# Patient Record
Sex: Male | Born: 1965
Health system: Southern US, Community
[De-identification: ages and names within clinical notes are randomized; demographics above are authoritative.]

## PROBLEM LIST (undated history)

## (undated) DIAGNOSIS — N189 Chronic kidney disease, unspecified: Secondary | ICD-10-CM

## (undated) DIAGNOSIS — I10 Essential (primary) hypertension: Secondary | ICD-10-CM

## (undated) HISTORY — PX: ANKLE SURGERY: SHX546

## (undated) HISTORY — DX: Chronic kidney disease, unspecified: N18.9

---

## 1998-06-19 ENCOUNTER — Emergency Department (HOSPITAL_COMMUNITY): Admission: EM | Admit: 1998-06-19 | Discharge: 1998-06-19 | Payer: Self-pay | Admitting: Emergency Medicine

## 1998-07-18 ENCOUNTER — Emergency Department (HOSPITAL_COMMUNITY): Admission: EM | Admit: 1998-07-18 | Discharge: 1998-07-18 | Payer: Self-pay | Admitting: Family Medicine

## 1998-07-20 ENCOUNTER — Emergency Department (HOSPITAL_COMMUNITY): Admission: EM | Admit: 1998-07-20 | Discharge: 1998-07-21 | Payer: Self-pay | Admitting: Emergency Medicine

## 1998-07-31 ENCOUNTER — Emergency Department (HOSPITAL_COMMUNITY): Admission: EM | Admit: 1998-07-31 | Discharge: 1998-07-31 | Payer: Self-pay | Admitting: Emergency Medicine

## 1998-08-01 ENCOUNTER — Emergency Department (HOSPITAL_COMMUNITY): Admission: EM | Admit: 1998-08-01 | Discharge: 1998-08-02 | Payer: Self-pay | Admitting: Emergency Medicine

## 2000-01-11 ENCOUNTER — Emergency Department (HOSPITAL_COMMUNITY): Admission: EM | Admit: 2000-01-11 | Discharge: 2000-01-11 | Payer: Self-pay | Admitting: *Deleted

## 2002-04-22 ENCOUNTER — Emergency Department (HOSPITAL_COMMUNITY): Admission: EM | Admit: 2002-04-22 | Discharge: 2002-04-22 | Payer: Self-pay

## 2005-01-15 ENCOUNTER — Emergency Department (HOSPITAL_COMMUNITY): Admission: EM | Admit: 2005-01-15 | Discharge: 2005-01-15 | Payer: Self-pay | Admitting: Family Medicine

## 2005-12-09 ENCOUNTER — Emergency Department (HOSPITAL_COMMUNITY): Admission: EM | Admit: 2005-12-09 | Discharge: 2005-12-09 | Payer: Self-pay | Admitting: Emergency Medicine

## 2006-04-11 ENCOUNTER — Emergency Department (HOSPITAL_COMMUNITY): Admission: EM | Admit: 2006-04-11 | Discharge: 2006-04-12 | Payer: Self-pay | Admitting: Emergency Medicine

## 2006-05-02 ENCOUNTER — Ambulatory Visit: Payer: Self-pay | Admitting: Gastroenterology

## 2006-05-22 ENCOUNTER — Ambulatory Visit: Payer: Self-pay | Admitting: Gastroenterology

## 2006-07-07 ENCOUNTER — Ambulatory Visit: Payer: Self-pay | Admitting: Gastroenterology

## 2007-06-29 ENCOUNTER — Emergency Department (HOSPITAL_COMMUNITY): Admission: EM | Admit: 2007-06-29 | Discharge: 2007-06-30 | Payer: Self-pay | Admitting: Emergency Medicine

## 2007-07-02 ENCOUNTER — Emergency Department (HOSPITAL_COMMUNITY): Admission: EM | Admit: 2007-07-02 | Discharge: 2007-07-03 | Payer: Self-pay | Admitting: Emergency Medicine

## 2007-12-24 HISTORY — PX: ANKLE SURGERY: SHX546

## 2008-07-12 ENCOUNTER — Telehealth: Payer: Self-pay | Admitting: Gastroenterology

## 2008-07-13 ENCOUNTER — Ambulatory Visit: Payer: Self-pay | Admitting: Gastroenterology

## 2008-07-13 DIAGNOSIS — K602 Anal fissure, unspecified: Secondary | ICD-10-CM

## 2009-03-17 ENCOUNTER — Emergency Department (HOSPITAL_COMMUNITY): Admission: EM | Admit: 2009-03-17 | Discharge: 2009-03-17 | Payer: Self-pay | Admitting: Emergency Medicine

## 2009-03-29 ENCOUNTER — Encounter: Admission: RE | Admit: 2009-03-29 | Discharge: 2009-05-31 | Payer: Self-pay | Admitting: Family Medicine

## 2009-04-24 ENCOUNTER — Emergency Department (HOSPITAL_COMMUNITY): Admission: EM | Admit: 2009-04-24 | Discharge: 2009-04-24 | Payer: Self-pay | Admitting: Emergency Medicine

## 2009-05-18 ENCOUNTER — Ambulatory Visit (HOSPITAL_COMMUNITY): Admission: RE | Admit: 2009-05-18 | Discharge: 2009-05-19 | Payer: Self-pay | Admitting: Specialist

## 2009-05-25 ENCOUNTER — Emergency Department (HOSPITAL_COMMUNITY): Admission: EM | Admit: 2009-05-25 | Discharge: 2009-05-25 | Payer: Self-pay | Admitting: Emergency Medicine

## 2011-04-01 LAB — DIFFERENTIAL
Basophils Relative: 0 % (ref 0–1)
Eosinophils Absolute: 0.4 10*3/uL (ref 0.0–0.7)
Eosinophils Relative: 5 % (ref 0–5)
Monocytes Relative: 7 % (ref 3–12)
Neutrophils Relative %: 65 % (ref 43–77)

## 2011-04-01 LAB — BASIC METABOLIC PANEL
BUN: 11 mg/dL (ref 6–23)
CO2: 24 mEq/L (ref 19–32)
Chloride: 105 mEq/L (ref 96–112)
Creatinine, Ser: 0.95 mg/dL (ref 0.4–1.5)

## 2011-04-01 LAB — CBC
HCT: 40.6 % (ref 39.0–52.0)
MCHC: 34.3 g/dL (ref 30.0–36.0)
MCV: 93.4 fL (ref 78.0–100.0)
Platelets: 259 10*3/uL (ref 150–400)
RBC: 4.34 MIL/uL (ref 4.22–5.81)

## 2011-04-02 LAB — HEMOGLOBIN AND HEMATOCRIT, BLOOD: Hemoglobin: 13.4 g/dL (ref 13.0–17.0)

## 2011-04-04 LAB — POCT I-STAT, CHEM 8
BUN: 13 mg/dL (ref 6–23)
Creatinine, Ser: 1.1 mg/dL (ref 0.4–1.5)
Glucose, Bld: 208 mg/dL — ABNORMAL HIGH (ref 70–99)
Hemoglobin: 16 g/dL (ref 13.0–17.0)
Potassium: 3.5 mEq/L (ref 3.5–5.1)

## 2011-05-03 LAB — PULMONARY FUNCTION TEST

## 2011-05-07 NOTE — Op Note (Signed)
NAMECORNEILIUS, BIENSTOCK NO.:  [id number]   MEDICAL RECORD NO.:  [id number]          PATIENT TYPE:  OIB   LOCATION:  [id number]                         FACILITY:  [facility]   PHYSICIAN:  Susa Day, M.D.    DATE OF BIRTH:  [birth year]   DATE OF PROCEDURE:  05/18/2009  DATE OF DISCHARGE:                               OPERATIVE REPORT   PREOPERATIVE DIAGNOSIS:  Bimalleolar ankle fracture, right, partially  healed.   POSTOPERATIVE DIAGNOSIS:  Bimalleolar ankle fracture, right, partially  healed.   PROCEDURE PERFORMED:  1. Open reduction and internal fixation of bimalleolar ankle fracture.  2. Take down a partially healed union of fibula.  3. Stress radiographs under anesthesia.  4. Application of short leg cast.   Technical difficulty increased due to partial healing and requirement of  takedown of partial union prior to ORIF.   ANESTHESIA:  General.   ASSISTANT:  Rometta Emery, P.A.   BRIEF HISTORY:  This is a 45 year old with fracture of this fibula,  bimalleolar, as well as the tibia.  He was indicated for ORIF to  displaced fibular fracture and shortening.  He, however, had significant  asthma and required preop clearance 3 weeks prior to open reduction and  internal fixation.  Therefore, partially healed, technical difficulty  increased due to the partial feeling.  Risks and benefits discussed  including bleeding, infection, damage to neurovascular structures, DVT,  PE and anesthetic complications, etc.   TECHNIQUE:  With the patient in supine position after adequate level  general anesthesia and 2 grams Kefzol, the right lower extremity was  prepped and draped and exsanguinated in the usual sterile fashion.  Thigh tourniquet inflated to 300 mmHg.  Exam under anesthesia revealed  shortening of the fibula, minimal motion.  Some widening of the medial  clear space.  I made an incision over the fibula.  The subcutaneous  tissue was dissected.  Electrocautery  was used to achieve hemostasis.  We identified the fracture site.  Care was taken not to injure the  peroneal nerve or branches.  We incised over the fibula and found a  partial callus.  This was removed with a combination of curettes, dental  pick and distraction techniques.  Somewhat difficult, although we were  able to enter the fracture site, clear up the partial callus, curette it  to good bleeding tissue.  Then we were able to increase the length of  the fibula.  Distraction techniques were utilized.  After this was  performed, we utilized bone clamps and clamped the fibula at the  greatest length achieved.  I then placed an inner frag screw by  overdrilling 3.5 and then drilling 2.5 from anterior to posterior,  proximal to distal with the appropriate length cortical screw, with  excellent purchase and expression of the hematoma and under x-ray,  anatomic reduction of the fibula.  Syndesmosis appeared to be stable  here, and with the foot in neutral position, reduction of the posterior  malleolus.  Therefore, I contoured a third tubular plate, 7 hole, on the  fibula and secured it proximally with 3 fully-threaded cortical screws  distally with 2 cancellous screws and 1 cortical screw after appropriate  drilling depth gauge of measurement and insertion of screw with  excellent purchase.  Stress radiographs indicated no widening of the  syndesmosis, anatomic mortise and in a neutral position reduction of the  posterior malleolus as well.  The wound was copiously irrigated and then  repaired the subcu with 2-0 Vicryl simple sutures in the periosteum,  with care not to incorporate the nerve.  The skin was reapproximated  with staples and the wound was dressed sterilely.  The tourniquet was  deflated.  Adequate vascularization of the lower extremity appreciated.  A short leg cast in neutral position was applied, this using fiberglass  and cold water.  Following the curing of the cast in  neutral position,  he was extubated without difficulty and transported to the recovery room  in satisfactory condition.   The patient tolerated the procedure.  There were no complications.      Susa Day, M.D.  Electronically Signed     JB/MEDQ  D:  05/18/2009  T:  05/18/2009  Job:  [id number]

## 2011-05-10 NOTE — Assessment & Plan Note (Signed)
[facility] OFFICE NOTE   Walter Harmon                     MRN:          [id number]  DATE:07/07/2006                            DOB:          02/22/1966    PROBLEMS:  Anal fissure.   HISTORY OF PRESENT ILLNESS:  Walter Harmon has returned for scheduled GI  follow up.  Colonoscopy did not demonstrate any abnormalities.  On a regimen  of diltiazem, warm soaks and Anamantle, Walter Harmon's symptoms have  improved.  He still has occasional rectal pain with bleeding.  He moves his  bowels irregularly.  He is taking fiber supplementation.   PHYSICAL EXAMINATION:  VITAL SIGNS:  Pulse 86, blood pressure 118/84, weight  180.   IMPRESSION:  Sympathetic anal fissure.   RECOMMENDATIONS:  Continue current regimens.  If symptoms persist, I offered  to inject this fissure with a Botox.  He will contact me if this situation  occurs.                                   Sandy Salaam. Deatra Ina, MD, [facility]   RDK/MedQ  DD:  07/07/2006  DT:  07/07/2006  Job #:  [id number]   cc:   Lindwood Qua

## 2011-10-23 ENCOUNTER — Emergency Department (HOSPITAL_COMMUNITY)
Admission: EM | Admit: 2011-10-23 | Discharge: 2011-10-24 | Disposition: A | Discharge status: Home / Self Care | Payer: Self-pay | Attending: Emergency Medicine | Admitting: Emergency Medicine

## 2011-10-23 DIAGNOSIS — K219 Gastro-esophageal reflux disease without esophagitis: Secondary | ICD-10-CM | POA: Insufficient documentation

## 2011-10-23 DIAGNOSIS — J45909 Unspecified asthma, uncomplicated: Secondary | ICD-10-CM | POA: Insufficient documentation

## 2011-10-23 DIAGNOSIS — R0609 Other forms of dyspnea: Secondary | ICD-10-CM | POA: Insufficient documentation

## 2011-10-23 DIAGNOSIS — R0989 Other specified symptoms and signs involving the circulatory and respiratory systems: Secondary | ICD-10-CM | POA: Insufficient documentation

## 2011-10-28 ENCOUNTER — Emergency Department (HOSPITAL_COMMUNITY)
Admission: EM | Admit: 2011-10-28 | Discharge: 2011-10-28 | Disposition: A | Discharge status: Hospice | Payer: Self-pay | Source: Home / Self Care | Attending: Family Medicine | Admitting: Family Medicine

## 2011-10-28 DIAGNOSIS — J45901 Unspecified asthma with (acute) exacerbation: Secondary | ICD-10-CM

## 2011-10-28 MED ORDER — MONTELUKAST SODIUM 10 MG PO TABS: 10.0000 mg | ORAL_TABLET | Freq: Every day | ORAL | Status: DC

## 2011-10-28 MED ORDER — ALBUTEROL SULFATE (5 MG/ML) 0.5% IN NEBU
5.0000 mg | INHALATION_SOLUTION | Freq: Once | RESPIRATORY_TRACT | Status: AC
  Administered 2011-10-28: 5 mg via RESPIRATORY_TRACT

## 2011-10-28 MED ORDER — IPRATROPIUM BROMIDE 0.02 % IN SOLN
0.5000 mg | Freq: Once | RESPIRATORY_TRACT | Status: AC
  Administered 2011-10-28: 0.5 mg via RESPIRATORY_TRACT

## 2011-10-28 MED ORDER — ALBUTEROL SULFATE HFA 108 (90 BASE) MCG/ACT IN AERS: 1.0000 | INHALATION_SPRAY | Freq: Four times a day (QID) | RESPIRATORY_TRACT | Status: DC | PRN

## 2011-10-28 NOTE — ED Notes (Signed)
C/o asthma flare up since Thursday; audible wheeze; stated clear secretions

## 2011-10-28 NOTE — ED Provider Notes (Addendum)
History     CSN: [id number] Arrival date & time: 10/28/2011  9:03 PM   First MD Initiated Contact with Patient 10/28/11 2143      Chief Complaint  Patient presents with  . Asthma    (Consider location/radiation/quality/duration/timing/severity/associated sxs/prior treatment) Patient is a 45 y.o. male presenting with asthma. The history is provided by the patient.  Asthma This is a recurrent problem. The current episode started 6 to 12 hours ago (pt feels sx aggravated by recent weather change). The problem has been gradually worsening. Associated symptoms include shortness of breath. Improvement on treatment: ran out of pro -air last week.    Past Medical History  Diagnosis Date  . Asthma   . Diabetes mellitus     History reviewed. No pertinent past surgical history.  History reviewed. No pertinent family history.  History  Substance Use Topics  . Smoking status: Never Smoker   . Smokeless tobacco: Not on file  . Alcohol Use: No      Review of Systems  Constitutional: Negative for fever and diaphoresis.  HENT: Negative.   Respiratory: Positive for cough, shortness of breath and wheezing.   Cardiovascular: Negative.     Allergies  Review of patient's allergies indicates no known allergies.  Home Medications   Current Outpatient Rx  Name Route Sig Dispense Refill  . BUDESONIDE-FORMOTEROL FUMARATE 160-4.5 MCG/ACT IN AERO Inhalation Inhale 2 puffs into the lungs 2 (two) times daily.      Marland Kitchen GLIPIZIDE 10 MG PO TABS Oral Take 10 mg by mouth 2 (two) times daily before a meal.      . METFORMIN HCL 1000 MG PO TABS Oral Take 1,000 mg by mouth 2 (two) times daily with a meal.      . ALBUTEROL SULFATE HFA 108 (90 BASE) MCG/ACT IN AERS Inhalation Inhale 1-2 puffs into the lungs every 6 (six) hours as needed for wheezing. 1 Inhaler 0  . MONTELUKAST SODIUM 10 MG PO TABS Oral Take 1 tablet (10 mg total) by mouth at bedtime. 30 tablet 1    BP 138/96  Pulse 110  Temp(Src)  97.1 F (36.2 C) (Oral)  Resp 20  SpO2 91%  Physical Exam  Constitutional: He is oriented to person, place, and time. He appears well-developed and well-nourished.  HENT:  Head: Normocephalic.  Mouth/Throat: Oropharynx is clear and moist.  Eyes: Pupils are equal, round, and reactive to light.  Neck: Normal range of motion. Neck supple.  Cardiovascular: Normal rate, regular rhythm and normal heart sounds.   Pulmonary/Chest: He has wheezes. He has no rales.  Abdominal: Soft. There is no tenderness.  Lymphadenopathy:    He has no cervical adenopathy.  Neurological: He is alert and oriented to person, place, and time.  Skin: Skin is warm and dry.    ED Course  Procedures (including critical care time)  Labs Reviewed - No data to display No results found.   1. Asthma exacerbation       MDM          Pauline Good, MD 10/28/11 2155  Billy Fischer, MD 04/17/12 [phone]

## 2011-11-06 LAB — PULMONARY FUNCTION TEST

## 2012-08-09 ENCOUNTER — Inpatient Hospital Stay (HOSPITAL_COMMUNITY)
Admission: EM | Admit: 2012-08-09 | Discharge: 2012-08-12 | DRG: 203 | Disposition: A | Discharge status: Home / Self Care | Payer: Medicaid Other | Attending: Internal Medicine | Admitting: Internal Medicine

## 2012-08-09 ENCOUNTER — Encounter (HOSPITAL_COMMUNITY): Payer: Self-pay | Admitting: *Deleted

## 2012-08-09 DIAGNOSIS — J45901 Unspecified asthma with (acute) exacerbation: Principal | ICD-10-CM | POA: Diagnosis present

## 2012-08-09 DIAGNOSIS — T380X5A Adverse effect of glucocorticoids and synthetic analogues, initial encounter: Secondary | ICD-10-CM | POA: Diagnosis present

## 2012-08-09 DIAGNOSIS — J45909 Unspecified asthma, uncomplicated: Secondary | ICD-10-CM | POA: Diagnosis present

## 2012-08-09 DIAGNOSIS — R000 Tachycardia, unspecified: Secondary | ICD-10-CM | POA: Diagnosis present

## 2012-08-09 DIAGNOSIS — E119 Type 2 diabetes mellitus without complications: Secondary | ICD-10-CM | POA: Diagnosis present

## 2012-08-09 DIAGNOSIS — Z79899 Other long term (current) drug therapy: Secondary | ICD-10-CM

## 2012-08-09 DIAGNOSIS — E1129 Type 2 diabetes mellitus with other diabetic kidney complication: Secondary | ICD-10-CM | POA: Diagnosis present

## 2012-08-09 DIAGNOSIS — N183 Chronic kidney disease, stage 3 unspecified: Secondary | ICD-10-CM | POA: Diagnosis present

## 2012-08-09 MED ORDER — ALBUTEROL SULFATE (5 MG/ML) 0.5% IN NEBU
5.0000 mg | INHALATION_SOLUTION | Freq: Once | RESPIRATORY_TRACT | Status: AC
  Administered 2012-08-09: 5 mg via RESPIRATORY_TRACT
  Filled 2012-08-09: qty 1

## 2012-08-09 MED ORDER — ALBUTEROL SULFATE (5 MG/ML) 0.5% IN NEBU
INHALATION_SOLUTION | RESPIRATORY_TRACT | Status: AC
  Filled 2012-08-09: qty 2

## 2012-08-09 MED ORDER — ALBUTEROL (5 MG/ML) CONTINUOUS INHALATION SOLN
10.0000 mg/h | INHALATION_SOLUTION | RESPIRATORY_TRACT | Status: AC
  Administered 2012-08-09: 10 mg/h via RESPIRATORY_TRACT

## 2012-08-09 MED ORDER — IPRATROPIUM BROMIDE 0.02 % IN SOLN
0.5000 mg | Freq: Once | RESPIRATORY_TRACT | Status: AC
  Administered 2012-08-09: 0.5 mg via RESPIRATORY_TRACT
  Filled 2012-08-09: qty 2.5

## 2012-08-09 NOTE — ED Notes (Signed)
RT contacted to perform treatment

## 2012-08-09 NOTE — ED Notes (Signed)
breathing treatment in progress. Martie Round RN present to start IV

## 2012-08-09 NOTE — ED Notes (Signed)
Pt states increasing problems with asthma/shortness of breath since Friday; no better after using enhaler

## 2012-08-09 NOTE — ED Provider Notes (Signed)
History     CSN: [id number]  Arrival date & time 08/09/12  2045   None     Chief Complaint  Patient presents with  . Shortness of Breath    (Consider location/radiation/quality/duration/timing/severity/associated sxs/prior treatment) HPI Comments: Patient with a history of asthma presents with shortness of breath and increase wheezing for the past 2 days. He reports the change in weather seems to be affecting his breathing. He does not get relief with his albuterol inhaler. Nothing seems to relieve his SOB according to the patient. He denies chest pain, fever, cough, NVD, recent illness.   Patient is a 46 y.o. male presenting with shortness of breath.  Shortness of Breath  Associated symptoms include shortness of breath. Pertinent negatives include no chest pain and no fever.    Past Medical History  Diagnosis Date  . Asthma   . Diabetes mellitus     History reviewed. No pertinent past surgical history.  No family history on file.  History  Substance Use Topics  . Smoking status: Never Smoker   . Smokeless tobacco: Not on file  . Alcohol Use: No      Review of Systems  Constitutional: Negative for fever, chills, diaphoresis and fatigue.  HENT: Negative for sneezing and sinus pressure.   Eyes: Negative for visual disturbance.  Respiratory: Positive for shortness of breath.   Cardiovascular: Negative for chest pain.  Gastrointestinal: Negative for abdominal pain.  Musculoskeletal: Negative for back pain.  Skin: Negative for rash and wound.  Neurological: Negative for weakness and headaches.    Allergies  Review of patient's allergies indicates no known allergies.  Home Medications   Current Outpatient Rx  Name Route Sig Dispense Refill  . ALBUTEROL SULFATE HFA 108 (90 BASE) MCG/ACT IN AERS Inhalation Inhale 1-2 puffs into the lungs every 6 (six) hours as needed.    . BUDESONIDE-FORMOTEROL FUMARATE 160-4.5 MCG/ACT IN AERO Inhalation Inhale 2 puffs into the  lungs 2 (two) times daily.      Marland Kitchen GLIPIZIDE 10 MG PO TABS Oral Take 10 mg by mouth 2 (two) times daily before a meal.      . METFORMIN HCL 1000 MG PO TABS Oral Take 1,000 mg by mouth 2 (two) times daily with a meal.      . MONTELUKAST SODIUM 10 MG PO TABS Oral Take 10 mg by mouth at bedtime.      BP 146/91  Pulse 121  Temp 98.6 F (37 C) (Oral)  Resp 24  SpO2 98%  Physical Exam  Nursing note and vitals reviewed. Constitutional: He is oriented to person, place, and time. He appears well-developed and well-nourished. No distress.  HENT:  Head: Normocephalic and atraumatic.  Mouth/Throat: No oropharyngeal exudate.  Eyes: Conjunctivae are normal. No scleral icterus.  Neck: Normal range of motion.  Cardiovascular: Regular rhythm.  Exam reveals no gallop and no friction rub.   No murmur heard.      Tachycardic in 120's  Pulmonary/Chest: Effort normal. He has wheezes.       Notable wheezing throughout lung fields bilaterally.   Abdominal: Soft. There is no tenderness.  Musculoskeletal: Normal range of motion.  Neurological: He is alert and oriented to person, place, and time.  Skin: Skin is warm and dry. He is not diaphoretic.  Psychiatric: He has a normal mood and affect. His behavior is normal.    ED Course  Procedures (including critical care time)  Labs Reviewed - No data to display No results found.  No diagnosis found.    MDM  11:04 PM Patient has notable wheezing throughout lung fields. A 1 hour albuterol nebulizer treatment is appropriate at this time.   12:25 AM Patient became diaphoretic and tachycardic upon receiving albuterol nebulizer. Patient signed out to Dr. Sharol Given.       Alvina Chou, PA-C 08/10/12 0019  Alvina Chou, PA-C 08/10/12 [phone]

## 2012-08-10 ENCOUNTER — Inpatient Hospital Stay (HOSPITAL_COMMUNITY): Payer: Medicaid Other

## 2012-08-10 ENCOUNTER — Other Ambulatory Visit (HOSPITAL_COMMUNITY): Payer: Self-pay

## 2012-08-10 ENCOUNTER — Encounter (HOSPITAL_COMMUNITY): Payer: Self-pay | Admitting: Internal Medicine

## 2012-08-10 DIAGNOSIS — J45901 Unspecified asthma with (acute) exacerbation: Principal | ICD-10-CM

## 2012-08-10 DIAGNOSIS — E119 Type 2 diabetes mellitus without complications: Secondary | ICD-10-CM | POA: Diagnosis present

## 2012-08-10 DIAGNOSIS — J45909 Unspecified asthma, uncomplicated: Secondary | ICD-10-CM | POA: Diagnosis present

## 2012-08-10 DIAGNOSIS — R000 Tachycardia, unspecified: Secondary | ICD-10-CM

## 2012-08-10 DIAGNOSIS — J441 Chronic obstructive pulmonary disease with (acute) exacerbation: Secondary | ICD-10-CM

## 2012-08-10 LAB — CARDIAC PANEL(CRET KIN+CKTOT+MB+TROPI)
CK, MB: 3.6 ng/mL (ref 0.3–4.0)
CK, MB: 3.6 ng/mL (ref 0.3–4.0)
CK, MB: 4 ng/mL (ref 0.3–4.0)
Relative Index: 1.1 (ref 0.0–2.5)
Total CK: 399 U/L — ABNORMAL HIGH (ref 7–232)
Troponin I: <0.3 ng/mL (ref <0.30)
Troponin I: <0.3 ng/mL (ref <0.30)

## 2012-08-10 LAB — GLUCOSE, CAPILLARY: Glucose-Capillary: 156 mg/dL — ABNORMAL HIGH (ref 70–99)

## 2012-08-10 LAB — HEMOGLOBIN A1C
Hgb A1c MFr Bld: 7.1 % — ABNORMAL HIGH (ref <5.7)
Hgb A1c MFr Bld: 7.2 % — ABNORMAL HIGH (ref <5.7)
Mean Plasma Glucose: 157 mg/dL — ABNORMAL HIGH (ref <117)

## 2012-08-10 LAB — COMPREHENSIVE METABOLIC PANEL
Alkaline Phosphatase: 59 U/L (ref 39–117)
BUN: 14 mg/dL (ref 6–23)
Chloride: 102 mEq/L (ref 96–112)
GFR calc Af Amer: 49 mL/min — ABNORMAL LOW (ref >90)
Glucose, Bld: 220 mg/dL — ABNORMAL HIGH (ref 70–99)
Potassium: 4 mEq/L (ref 3.5–5.1)
Total Bilirubin: 0.2 mg/dL — ABNORMAL LOW (ref 0.3–1.2)

## 2012-08-10 LAB — CBC
MCH: 31 pg (ref 26.0–34.0)
MCV: 91.3 fL (ref 78.0–100.0)
Platelets: 236 10*3/uL (ref 150–400)
RBC: 4.26 MIL/uL (ref 4.22–5.81)
RDW: 13.5 % (ref 11.5–15.5)

## 2012-08-10 MED ORDER — SODIUM CHLORIDE 0.9 % IJ SOLN
3.0000 mL | Freq: Two times a day (BID) | INTRAMUSCULAR | Status: DC
  Administered 2012-08-10 – 2012-08-11 (×2): 3 mL via INTRAVENOUS

## 2012-08-10 MED ORDER — PREDNISONE 20 MG PO TABS: 60.0000 mg | ORAL_TABLET | Freq: Once | ORAL | Status: DC

## 2012-08-10 MED ORDER — SODIUM CHLORIDE 0.9 % IV SOLN
INTRAVENOUS | Status: DC
  Administered 2012-08-10 – 2012-08-12 (×5): via INTRAVENOUS

## 2012-08-10 MED ORDER — BUDESONIDE-FORMOTEROL FUMARATE 160-4.5 MCG/ACT IN AERO
2.0000 | INHALATION_SPRAY | Freq: Two times a day (BID) | RESPIRATORY_TRACT | Status: DC
  Administered 2012-08-10 – 2012-08-12 (×5): 2 via RESPIRATORY_TRACT
  Filled 2012-08-10: qty 6

## 2012-08-10 MED ORDER — METHYLPREDNISOLONE SODIUM SUCC 125 MG IJ SOLR
80.0000 mg | Freq: Three times a day (TID) | INTRAMUSCULAR | Status: DC
  Administered 2012-08-10 – 2012-08-11 (×4): 80 mg via INTRAVENOUS
  Filled 2012-08-10 (×7): qty 1.28

## 2012-08-10 MED ORDER — METHYLPREDNISOLONE SODIUM SUCC 125 MG IJ SOLR
INTRAMUSCULAR | Status: AC
  Administered 2012-08-10: 125 mg via INTRAVENOUS
  Filled 2012-08-10: qty 2

## 2012-08-10 MED ORDER — INSULIN ASPART 100 UNIT/ML ~~LOC~~ SOLN
0.0000 [IU] | Freq: Every day | SUBCUTANEOUS | Status: DC
  Administered 2012-08-11: 2 [IU] via SUBCUTANEOUS

## 2012-08-10 MED ORDER — ALBUTEROL (5 MG/ML) CONTINUOUS INHALATION SOLN
10.0000 mg/h | INHALATION_SOLUTION | RESPIRATORY_TRACT | Status: AC
  Administered 2012-08-10: 10 mg/h via RESPIRATORY_TRACT

## 2012-08-10 MED ORDER — METHYLPREDNISOLONE SODIUM SUCC 125 MG IJ SOLR
125.0000 mg | Freq: Once | INTRAMUSCULAR | Status: AC
  Administered 2012-08-10: 125 mg via INTRAVENOUS

## 2012-08-10 MED ORDER — INSULIN ASPART 100 UNIT/ML ~~LOC~~ SOLN
0.0000 [IU] | Freq: Three times a day (TID) | SUBCUTANEOUS | Status: DC
  Administered 2012-08-10: 2 [IU] via SUBCUTANEOUS
  Administered 2012-08-10: 5 [IU] via SUBCUTANEOUS
  Administered 2012-08-10 – 2012-08-11 (×2): 2 [IU] via SUBCUTANEOUS
  Administered 2012-08-11 (×2): 3 [IU] via SUBCUTANEOUS
  Administered 2012-08-12 (×2): 2 [IU] via SUBCUTANEOUS
  Administered 2012-08-12: 3 [IU] via SUBCUTANEOUS

## 2012-08-10 MED ORDER — ALBUTEROL SULFATE HFA 108 (90 BASE) MCG/ACT IN AERS
1.0000 | INHALATION_SPRAY | Freq: Four times a day (QID) | RESPIRATORY_TRACT | Status: DC | PRN
  Filled 2012-08-10: qty 6.7

## 2012-08-10 MED ORDER — METFORMIN HCL 500 MG PO TABS
1000.0000 mg | ORAL_TABLET | Freq: Two times a day (BID) | ORAL | Status: DC
  Administered 2012-08-10: 1000 mg via ORAL
  Filled 2012-08-10 (×3): qty 2

## 2012-08-10 MED ORDER — TECHNETIUM TO 99M ALBUMIN AGGREGATED
3.1000 | Freq: Once | INTRAVENOUS | Status: AC | PRN
  Administered 2012-08-10: 3 via INTRAVENOUS

## 2012-08-10 MED ORDER — GI COCKTAIL ~~LOC~~
30.0000 mL | Freq: Three times a day (TID) | ORAL | Status: AC | PRN
  Administered 2012-08-10 – 2012-08-12 (×3): 30 mL via ORAL
  Filled 2012-08-10 (×3): qty 30

## 2012-08-10 MED ORDER — ALBUTEROL SULFATE (5 MG/ML) 0.5% IN NEBU
2.5000 mg | INHALATION_SOLUTION | Freq: Four times a day (QID) | RESPIRATORY_TRACT | Status: DC
  Administered 2012-08-10 – 2012-08-11 (×7): 2.5 mg via RESPIRATORY_TRACT
  Filled 2012-08-10 (×7): qty 0.5

## 2012-08-10 MED ORDER — CHLORPROMAZINE HCL 25 MG PO TABS
25.0000 mg | ORAL_TABLET | Freq: Once | ORAL | Status: AC
  Administered 2012-08-10: 25 mg via ORAL
  Filled 2012-08-10: qty 1

## 2012-08-10 MED ORDER — ALUM & MAG HYDROXIDE-SIMETH 200-200-20 MG/5ML PO SUSP
30.0000 mL | ORAL | Status: DC | PRN
  Administered 2012-08-10 – 2012-08-12 (×5): 30 mL via ORAL
  Filled 2012-08-10 (×6): qty 30

## 2012-08-10 MED ORDER — MONTELUKAST SODIUM 10 MG PO TABS
10.0000 mg | ORAL_TABLET | Freq: Every day | ORAL | Status: DC
  Administered 2012-08-10 – 2012-08-11 (×2): 10 mg via ORAL
  Filled 2012-08-10 (×3): qty 1

## 2012-08-10 MED ORDER — GLIPIZIDE 10 MG PO TABS
10.0000 mg | ORAL_TABLET | Freq: Two times a day (BID) | ORAL | Status: DC
  Administered 2012-08-10 – 2012-08-12 (×6): 10 mg via ORAL
  Filled 2012-08-10 (×7): qty 1

## 2012-08-10 NOTE — ED Notes (Signed)
Report called to 4E.

## 2012-08-10 NOTE — ED Notes (Signed)
Pt spilled last part of neb tx.

## 2012-08-10 NOTE — Progress Notes (Signed)
PHARMACIST - PHYSICIAN COMMUNICATION DR:  Allyson Sabal CONCERNING:  METFORMIN SAFE ADMINISTRATION POLICY  RECOMMENDATION: Metformin has been placed on DISCONTINUE (rejected order) STATUS and should be reordered only after any of the conditions below are ruled out.  Current safety recommendations include avoiding metformin for a minimum of 48 hours after the patient's exposure to intravenous contrast media.  DESCRIPTION:  The Pharmacy Committee has adopted a policy that restricts the use of metformin in hospitalized patients until all the contraindications to administration have been ruled out. Specific contraindications are: [x]  Serum creatinine ? 1.5 for males []  Serum creatinine ? 1.4 for females []  Shock, acute MI, sepsis, hypoxemia, dehydration []  Planned administration of intravenous iodinated contrast media []  Heart Failure patients with low EF []  Acute or chronic metabolic acidosis (including DKA)      Hershal Coria, PharmD, BCPS Pager: [phone] 08/10/2012 2:59 PM

## 2012-08-10 NOTE — Progress Notes (Signed)
Patient seen and examined agree with the plan of care outlined by Dr. Maudie Mercury Continue high-dose IV Solu-Medrol Nebulizer treatments 2-D echo will be done Check a d-dimer given sinus tachycardia hypoxia if elevated CT angiogram chest to rule out pulmonary embolism

## 2012-08-10 NOTE — H&P (Signed)
[facility] History and Physical  Walter Harmon [id number] DOB: 08/12/1966 DOA: 08/09/2012  Referring physician: Sharol Given PCP: No primary provider on file.  Walter Harmon  Chief Complaint: sob  HPI:  46 yo male with asthma c/o sob starting on Friday, + cough with phlegm, white, chest sore with coughing.  denies palp, n/v, diarrhea, brbpr, fever, chills.  Pt came in because sob, not improving with albuterol and symbicort.    In ED tx with multiple alb neb wihtout signficant improvement ,  CXR pending,  Pt will be admitted for asthma exacerbation.   Review of Systems:  Negative for all organ systems except for + above.   Past Medical History  Diagnosis Date  . Asthma   . Diabetes mellitus    History reviewed. No pertinent past surgical history. Social History:  reports that he has never smoked. He has never used smokeless tobacco. He reports that he does not drink alcohol. His drug history not on file. Lives with parents,  Able to perform all ADLS  No Known Allergies  Family History  Problem Relation Age of Onset  . Diabetes type II Mother   (be sure to complete)  Prior to Admission medications   Medication Sig Start Date End Date Taking? Authorizing Provider  albuterol (PROVENTIL HFA;VENTOLIN HFA) 108 (90 BASE) MCG/ACT inhaler Inhale 1-2 puffs into the lungs every 6 (six) hours as needed. 10/28/11 10/27/12 Yes Billy Fischer, MD  budesonide-formoterol [facility]) 160-4.5 MCG/ACT inhaler Inhale 2 puffs into the lungs 2 (two) times daily.     Yes Historical Provider, MD  glipiZIDE (GLUCOTROL) 10 MG tablet Take 10 mg by mouth 2 (two) times daily before a meal.     Yes Historical Provider, MD  metFORMIN (GLUCOPHAGE) 1000 MG tablet Take 1,000 mg by mouth 2 (two) times daily with a meal.     Yes Historical Provider, MD  montelukast (SINGULAIR) 10 MG tablet Take 10 mg by mouth at bedtime. 10/28/11 10/27/12 Yes Billy Fischer, MD   Physical Exam: Filed Vitals:   08/10/12 0130  08/10/12 0145 08/10/12 0158 08/10/12 0200  BP: 130/85 146/92 141/88 128/78  Pulse: 113 113  125  Temp:      TempSrc:      Resp: 21 21 18 16   SpO2: 99% 99% 100% 100%     General:  Young african [place] male  Eyes: anicteric  ENT: mmm  Neck: no jvd, no bruit, no tm, no adenopaty  Cardiovascular: rrr s1, s2  Respiratory: + wheeze, no crackles  Abdomen: soft, nt, nd, +bs  Skin: no rash, no c/c/e  Musculoskeletal: wnl  Psychiatric: no depression, axox3  Neurologic: nonfocal  Labs on Admission:  Basic Metabolic Panel: No results found for this basename: NA:5,K:5,CL:5,CO2:5,GLUCOSE:5,BUN:5,CREATININE:5,CALCIUM:5,MG:5,PHOS:5 in the last 168 hours Liver Function Tests: No results found for this basename: AST:5,ALT:5,ALKPHOS:5,BILITOT:5,PROT:5,ALBUMIN:5 in the last 168 hours No results found for this basename: LIPASE:5,AMYLASE:5 in the last 168 hours No results found for this basename: AMMONIA:5 in the last 168 hours CBC: No results found for this basename: WBC:5,NEUTROABS:5,HGB:5,HCT:5,MCV:5,PLT:5 in the last 168 hours Cardiac Enzymes: No results found for this basename: CKTOTAL:5,CKMB:5,CKMBINDEX:5,TROPONINI:5 in the last 168 hours  BNP (last 3 results) No results found for this basename: PROBNP:3 in the last 8760 hours CBG: No results found for this basename: GLUCAP:5 in the last 168 hours  Radiological Exams on Admission: No results found.  EKG:   Assessment/Plan Active Problems:  Asthma exacerbation  Diabetes type 2, controlled   1. Asthma exacerbation:  Solumedrol , albuterol, budesonide, albuterol prn,, CXR 2. Dm2: fsbs ac and qhs, ISS,  Glipizide, metformin.  3. Tachycardia likely secondary to albuterol. Cycle cardiac markers,  Consider further w/up with TSH, Echo   Code Status: Full Family Communication: father, Walter Harmon [id number] Disposition Plan: home  Time spent: 81minutes  Jani Gravel [facility] [phone] If 7PM-7AM,  please contact night-coverage [email or web address] Password [email or web address] 08/10/2012, 2:58 AM

## 2012-08-10 NOTE — Care Management Note (Signed)
    Page 1 of 1   08/13/2012     1:33:01 PM   CARE MANAGEMENT NOTE 08/13/2012  Patient:  Walter Harmon, Walter Harmon   Account Number:  [id number]  Date Initiated:  08/10/2012  Documentation initiated by:  [facility]  Subjective/Objective Assessment:   ADMITTED [id number]     Action/Plan:   FROM HOME   Anticipated DC Date:  08/12/2012   Anticipated DC Plan:  [place]  CM consult      Choice offered to / List presented to:             Status of service:  Completed, signed off Medicare Important Message given?   (If response is "NO", the following Medicare IM given date fields will be blank) Date Medicare IM given:   Date Additional Medicare IM given:    Discharge Disposition:  HOME/SELF CARE  Per UR Regulation:  Reviewed for med. necessity/level of care/duration of stay  If discussed at [place] of Stay Meetings, dates discussed:    Comments:  08/10/12 [facility] RN,BSN NCM [id number]

## 2012-08-11 DIAGNOSIS — J45909 Unspecified asthma, uncomplicated: Secondary | ICD-10-CM

## 2012-08-11 DIAGNOSIS — R0602 Shortness of breath: Secondary | ICD-10-CM

## 2012-08-11 LAB — GLUCOSE, CAPILLARY
Glucose-Capillary: 201 mg/dL — ABNORMAL HIGH (ref 70–99)
Glucose-Capillary: 240 mg/dL — ABNORMAL HIGH (ref 70–99)

## 2012-08-11 LAB — COMPREHENSIVE METABOLIC PANEL
ALT: 16 U/L (ref 0–53)
Calcium: 8.6 mg/dL (ref 8.4–10.5)
GFR calc Af Amer: 50 mL/min — ABNORMAL LOW (ref >90)
Glucose, Bld: 163 mg/dL — ABNORMAL HIGH (ref 70–99)
Sodium: 136 mEq/L (ref 135–145)
Total Protein: 7.3 g/dL (ref 6.0–8.3)

## 2012-08-11 LAB — DIFFERENTIAL
Lymphocytes Relative: 7 % — ABNORMAL LOW (ref 12–46)
Monocytes Absolute: 0.6 10*3/uL (ref 0.1–1.0)
Monocytes Relative: 3 % (ref 3–12)
Neutro Abs: 19.8 10*3/uL — ABNORMAL HIGH (ref 1.7–7.7)

## 2012-08-11 LAB — CBC
HCT: 36.7 % — ABNORMAL LOW (ref 39.0–52.0)
Hemoglobin: 12.8 g/dL — ABNORMAL LOW (ref 13.0–17.0)
MCHC: 34.9 g/dL (ref 30.0–36.0)
WBC: 21.9 10*3/uL — ABNORMAL HIGH (ref 4.0–10.5)

## 2012-08-11 MED ORDER — METHYLPREDNISOLONE SODIUM SUCC 40 MG IJ SOLR
40.0000 mg | Freq: Three times a day (TID) | INTRAMUSCULAR | Status: DC
  Administered 2012-08-11 – 2012-08-12 (×4): 40 mg via INTRAVENOUS
  Filled 2012-08-11 (×6): qty 1

## 2012-08-11 NOTE — Progress Notes (Signed)
[facility] PROGRESS NOTE  Walter Harmon [id number] DOB: 04-03-1966 DOA: 08/09/2012 PCP: No primary provider on file.  Assessment/Plan: Active Problems:  Asthma exacerbation  Diabetes type 2, controlled  1. Asthma exacerbation: Solumedrol , albuterol, budesonide, albuterol prn,, CXR improving slowly, taper steroids today 2. Dm2: fsbs ac and qhs, ISS, Glipizide, metformin. Hemoglobin A1c of 7.1 3. Tachycardia likely secondary to albuterol. Cycle cardiac markers, TSH normal, 2-D echo pending, VQ scan negative for PE, elevated d-dimer Doppler pending 4. Leukocytosis secondary to steroids no focal symptoms 5. CK D. stage III, this is likely chronic, no labs to compare, normal kidney function 3 years ago, patient was told last year during an insurance health checkup and his creatinine was elevated, will need an outpatient nephrology referral  Code Status: full Family Communication: family updated about patient's clinical progress Disposition Plan:  As above    Brief narrative: 46 yo male with asthma c/o sob starting on Friday, + cough with phlegm, white, chest sore with coughing. denies palp, n/v, diarrhea, brbpr, fever, chills. Pt came in because sob, not improving with albuterol and symbicort   Consultants:  None  Procedures:  2-D echo, VQ scan  Antibiotics:  1  HPI/Subjective: Denies shortness of breath today, still wheezing  Objective: Filed Vitals:   08/10/12 2231 08/11/12 0536 08/11/12 0830 08/11/12 1145  BP: 147/90 126/76    Pulse: 124 94    Temp: 98.6 F (37 C) 98.4 F (36.9 C)    TempSrc: Oral Oral    Resp: 20 20    Height:      Weight:  81.7 kg (180 lb 1.9 oz)    SpO2: 92% 95% 94% 92%    Intake/Output Summary (Last 24 hours) at 08/11/12 1227 Last data filed at 08/11/12 0700  Gross per 24 hour  Intake   2380 ml  Output    850 ml  Net   1530 ml    Exam:  HENT:  Head: Atraumatic.  Nose: Nose normal.  Mouth/Throat: Oropharynx is clear  and moist.  Eyes: Conjunctivae are normal. Pupils are equal, round, and reactive to light. No scleral icterus.  Neck: Neck supple. No tracheal deviation present.  Cardiovascular: Normal rate, regular rhythm, normal heart sounds and intact distal pulses.  Pulmonary/Chest: Effort normal and breath sounds normal. No respiratory distress.  Abdominal: Soft. Normal appearance and bowel sounds are normal. She exhibits no distension. There is no tenderness.  Musculoskeletal: She exhibits no edema and no tenderness.  Neurological: She is alert. No cranial nerve deficit.    Data Reviewed: Basic Metabolic Panel:  Lab [id number] 2345 08/10/12 1115  NA 136 137  K 4.2 4.0  CL 102 102  CO2 21 19  GLUCOSE 163* 220*  BUN 19 14  CREATININE 1.82* 1.86*  CALCIUM 8.6 8.7  MG -- --  PHOS -- --    Liver Function Tests:  Lab 08/10/12 2345 08/10/12 1115  AST 15 13  ALT 16 15  ALKPHOS 64 59  BILITOT 0.2* 0.2*  PROT 7.3 6.9  ALBUMIN 3.2* 2.9*   No results found for this basename: LIPASE:5,AMYLASE:5 in the last 168 hours No results found for this basename: AMMONIA:5 in the last 168 hours  CBC:  Lab 08/10/12 2345 08/10/12 0506  WBC 21.9* 12.5*  NEUTROABS 19.8* --  HGB 12.8* 13.2  HCT 36.7* 38.9*  MCV 89.1 91.3  PLT 262 236    Cardiac Enzymes:  Lab 08/10/12 1845 08/10/12 1108 08/10/12 0321  CKTOTAL 348* 315* 399*  CKMB 4.0 3.6 3.6  CKMBINDEX -- -- --  TROPONINI <0.30 <0.30 <0.30   BNP (last 3 results) No results found for this basename: PROBNP:3 in the last 8760 hours   CBG:  Lab 08/11/12 0756 08/10/12 2132 08/10/12 1732 08/10/12 1225 08/10/12 0802  GLUCAP 201* 190* 156* 187* 298*    No results found for this or any previous visit (from the past 240 hour(s)).   Studies: Dg Chest 2 View  08/10/2012  *RADIOLOGY REPORT*  Clinical Data: Shortness of breath and cough.  CHEST - 2 VIEW  Comparison: 03/17/2009.  Findings: Slightly shallow inspiration. The heart size and pulmonary  vascularity are normal. The lungs appear clear and expanded without focal air space disease or consolidation. No blunting of the costophrenic angles.  No pneumothorax.  Mediastinal contours appear intact.  Mild degenerative changes in the spine. Stable appearance since previous study.  IMPRESSION: No evidence of active pulmonary disease.  Original Report Authenticated By: Neale Burly, M.D.   Nm Pulmonary Perfusion  08/10/2012  *RADIOLOGY REPORT*  Clinical Data: Shortness of breath, asthma, evaluate for PE  NM PULMONARY PERFUSION PARTICULATE  Radiopharmaceutical: 3MILLI CURIE MAA TECHNETIUM TO 49M ALBUMIN AGGREGATED  Comparison: Correlation with chest radiographs dated 08/10/2012  Findings: Essentially normal perfusion.  Ventilation was not performed.  When correlating with the prior chest radiograph, the lungs are clear.  IMPRESSION: Negative for pulmonary embolism.   Original Report Authenticated By: Julian Hy, M.D.     Scheduled Meds:   . albuterol  2.5 mg Nebulization QID  . budesonide-formoterol  2 puff Inhalation BID  . chlorproMAZINE  25 mg Oral Once  . glipiZIDE  10 mg Oral BID AC  . insulin aspart  0-5 Units Subcutaneous QHS  . insulin aspart  0-9 Units Subcutaneous TID WC  . methylPREDNISolone (SOLU-MEDROL) injection  40 mg Intravenous Q8H  . montelukast  10 mg Oral QHS  . sodium chloride  3 mL Intravenous Q12H  . DISCONTD: metFORMIN  1,000 mg Oral BID WC  . DISCONTD: methylPREDNISolone (SOLU-MEDROL) injection  80 mg Intravenous Q8H   Continuous Infusions:   . sodium chloride 75 mL/hr at 08/11/12 [id number]    Active Problems:  Asthma exacerbation  Diabetes type 2, controlled    Time spent: 40 minutes   [facility] Pager [phone]. If 8PM-8AM, please contact night-coverage at [email or web address], password [facility] 08/11/2012, 12:27 PM  LOS: 2 days

## 2012-08-11 NOTE — ED Provider Notes (Signed)
Medical screening examination/treatment/procedure(s) were performed by non-physician practitioner and as supervising physician I was immediately available for consultation/collaboration.    Dot Lanes, MD 08/11/12 2249

## 2012-08-11 NOTE — Progress Notes (Signed)
Echocardiogram 2D Echocardiogram has been performed.  Walter Harmon 08/11/2012, 10:00 AM

## 2012-08-11 NOTE — Progress Notes (Signed)
O2 check while ambulating on room air. O2 sats 91-92%. Callie Fielding RN

## 2012-08-12 DIAGNOSIS — E119 Type 2 diabetes mellitus without complications: Secondary | ICD-10-CM

## 2012-08-12 DIAGNOSIS — R000 Tachycardia, unspecified: Secondary | ICD-10-CM

## 2012-08-12 LAB — GLUCOSE, CAPILLARY
Glucose-Capillary: 144 mg/dL — ABNORMAL HIGH (ref 70–99)
Glucose-Capillary: 199 mg/dL — ABNORMAL HIGH (ref 70–99)
Glucose-Capillary: 191 mg/dL — ABNORMAL HIGH (ref 70–99)

## 2012-08-12 LAB — BASIC METABOLIC PANEL
BUN: 24 mg/dL — ABNORMAL HIGH (ref 6–23)
CO2: 23 mEq/L (ref 19–32)
Calcium: 8.2 mg/dL — ABNORMAL LOW (ref 8.4–10.5)
Creatinine, Ser: 1.65 mg/dL — ABNORMAL HIGH (ref 0.50–1.35)
GFR calc non Af Amer: 49 mL/min — ABNORMAL LOW (ref >90)
Glucose, Bld: 190 mg/dL — ABNORMAL HIGH (ref 70–99)

## 2012-08-12 MED ORDER — BUDESONIDE-FORMOTEROL FUMARATE 160-4.5 MCG/ACT IN AERO: 2.0000 | INHALATION_SPRAY | Freq: Two times a day (BID) | RESPIRATORY_TRACT | Status: DC

## 2012-08-12 MED ORDER — CHLORPROMAZINE HCL 25 MG PO TABS
25.0000 mg | ORAL_TABLET | Freq: Once | ORAL | Status: AC
  Administered 2012-08-12: 25 mg via ORAL
  Filled 2012-08-12: qty 1

## 2012-08-12 MED ORDER — PREDNISONE (PAK) 10 MG PO TABS: ORAL_TABLET | ORAL | Status: DC

## 2012-08-12 MED ORDER — ALBUTEROL SULFATE HFA 108 (90 BASE) MCG/ACT IN AERS
2.0000 | INHALATION_SPRAY | Freq: Three times a day (TID) | RESPIRATORY_TRACT | Status: DC
  Administered 2012-08-12 (×2): 2 via RESPIRATORY_TRACT
  Filled 2012-08-12: qty 6.7

## 2012-08-12 MED ORDER — ALBUTEROL SULFATE (5 MG/ML) 0.5% IN NEBU: 2.5000 mg | INHALATION_SOLUTION | Freq: Four times a day (QID) | RESPIRATORY_TRACT | Status: DC | PRN

## 2012-08-12 NOTE — Discharge Summary (Signed)
Physician Discharge Summary  Walter Harmon [id number] DOB: [birth year] DOA: 08/09/2012  PCP: Elizabeth Palau, MD  Admit date: 08/09/2012 Discharge date: 08/12/2012  Recommendations for Outpatient Follow-up:  Followup with primary care physician   Discharge Diagnoses:  Principal Problem:  *Asthma exacerbation Active Problems:  Diabetes type 2, controlled  CKD (chronic kidney disease), stage III  Tachycardia   Discharge Condition: Stable  Diet recommendation: Regular  Filed Weights   08/10/12 0500 08/11/12 0536 08/12/12 0700  Weight: 79.8 kg (175 lb 14.8 oz) 81.7 kg (180 lb 1.9 oz) 83.87 kg (184 lb 14.4 oz)    History of present illness:  46 yo male with asthma c/o sob starting on Friday, + cough with phlegm, white, chest sore with coughing. denies palp, n/v, diarrhea, brbpr, fever, chills. Pt came in because sob, not improving with albuterol and symbicort.  In ED tx with multiple alb neb wihtout signficant improvement , CXR pending, Pt will be admitted for asthma exacerbation.    Hospital Course:   1. Asthma exacerbation: After patient admitted to the hospital, he was started on systemic steroids, inhaled bronchodilators, on oxygen as needed. His chest x-ray did not show any active pulmonary disease. He had a VQ scan done because of tachycardia and showed low probability for PE. His wheezing and shortness of breath improved so he was discharged home today on prednisone taper.  2. Diabetes mellitus type 2: Hemoglobin A1c of 7.1, he is on glipizide and metformin this was continued throughout the hospital stay. Also because of the systemic steroids he was placed on insulin sliding scale while he is in the hospital.  3. Tachycardia: This was thought to be secondary to inhaled beta agonist, namely albuterol. Please note that VQ scan was done and showed low probability for PE.  4. CK stage III: Has creatinine baseline of 1.6. His creatinine in 2010 was 0.95. Patient is NOT  on ACE inhibitors or ARB. This is likely secondary to his diabetes mellitus. Patient needs further followup as outpatient.  Procedures:  None  Consultations:  None  Discharge Exam: Filed Vitals:   08/12/12 0502  BP: 156/99  Pulse: 76  Temp: 98.4 F (36.9 C)  Resp: 16   Filed Vitals:   08/11/12 2219 08/12/12 0502 08/12/12 0700 08/12/12 0914  BP: 139/90 156/99    Pulse: 100 76    Temp: 98.3 F (36.8 C) 98.4 F (36.9 C)    TempSrc: Oral Oral    Resp: 18 16    Height:      Weight:   83.87 kg (184 lb 14.4 oz)   SpO2: 94% 98%  96%   General: Alert and awake, oriented x3, not in any acute distress. HEENT: anicteric sclera, pupils reactive to light and accommodation, EOMI CVS: S1-S2 clear, no murmur rubs or gallops Chest: clear to auscultation bilaterally, no wheezing, rales or rhonchi Abdomen: soft nontender, nondistended, normal bowel sounds, no organomegaly Extremities: no cyanosis, clubbing or edema noted bilaterally Neuro: Cranial nerves II-XII intact, no focal neurological deficits  Discharge Instructions   Medication List  As of 08/12/2012  1:44 PM   TAKE these medications         albuterol 108 (90 BASE) MCG/ACT inhaler   Commonly known as: PROVENTIL HFA;VENTOLIN HFA   Inhale 1-2 puffs into the lungs every 6 (six) hours as needed.      budesonide-formoterol 160-4.5 MCG/ACT inhaler   Commonly known as: SYMBICORT   Inhale 2 puffs into the lungs 2 (two) times daily.  glipiZIDE 10 MG tablet   Commonly known as: GLUCOTROL   Take 10 mg by mouth 2 (two) times daily before a meal.      metFORMIN 1000 MG tablet   Commonly known as: GLUCOPHAGE   Take 1,000 mg by mouth 2 (two) times daily with a meal.      montelukast 10 MG tablet   Commonly known as: SINGULAIR   Take 10 mg by mouth at bedtime.      predniSONE 10 MG tablet   Commonly known as: STERAPRED UNI-PAK   Take 6-5-4-3-2-1 tabs PO daily till gone           Follow-up Information    Follow up  with Elizabeth Palau, MD in 1 week.   Contact information:   Individual [address]and[facility] Gemma  [place] SSN-[id number]           The results of significant diagnostics from this hospitalization (including imaging, microbiology, ancillary and laboratory) are listed below for reference.    Significant Diagnostic Studies: Dg Chest 2 View  08/10/2012  *RADIOLOGY REPORT*  Clinical Data: Shortness of breath and cough.  CHEST - 2 VIEW  Comparison: 03/17/2009.  Findings: Slightly shallow inspiration. The heart size and pulmonary vascularity are normal. The lungs appear clear and expanded without focal air space disease or consolidation. No blunting of the costophrenic angles.  No pneumothorax.  Mediastinal contours appear intact.  Mild degenerative changes in the spine. Stable appearance since previous study.  IMPRESSION: No evidence of active pulmonary disease.  Original Report Authenticated By: Neale Burly, M.D.   Nm Pulmonary Perfusion  08/10/2012  *RADIOLOGY REPORT*  Clinical Data: Shortness of breath, asthma, evaluate for PE  NM PULMONARY PERFUSION PARTICULATE  Radiopharmaceutical: 3MILLI CURIE MAA TECHNETIUM TO 35M ALBUMIN AGGREGATED  Comparison: Correlation with chest radiographs dated 08/10/2012  Findings: Essentially normal perfusion.  Ventilation was not performed.  When correlating with the prior chest radiograph, the lungs are clear.  IMPRESSION: Negative for pulmonary embolism.   Original Report Authenticated By: Julian Hy, M.D.     Microbiology: No results found for this or any previous visit (from the past 240 hour(s)).   Labs: Basic Metabolic Panel:  Lab [id number] 08/10/12 2345 08/10/12 1115  NA 133* 136 137  K 4.5 4.2 4.0  CL 104 102 102  CO2 23 21 19   GLUCOSE 190* 163* 220*  BUN 24* 19 14  CREATININE 1.65* 1.82* 1.86*  CALCIUM 8.2* 8.6 8.7  MG -- -- --  PHOS -- -- --   Liver Function Tests:  Lab 08/10/12 2345 08/10/12 1115    AST 15 13  ALT 16 15  ALKPHOS 64 59  BILITOT 0.2* 0.2*  PROT 7.3 6.9  ALBUMIN 3.2* 2.9*   No results found for this basename: LIPASE:5,AMYLASE:5 in the last 168 hours No results found for this basename: AMMONIA:5 in the last 168 hours CBC:  Lab 08/10/12 2345 08/10/12 0506  WBC 21.9* 12.5*  NEUTROABS 19.8* --  HGB 12.8* 13.2  HCT 36.7* 38.9*  MCV 89.1 91.3  PLT 262 236   Cardiac Enzymes:  Lab 08/10/12 1845 08/10/12 1108 08/10/12 0321  CKTOTAL 348* 315* 399*  CKMB 4.0 3.6 3.6  CKMBINDEX -- -- --  TROPONINI <0.30 <0.30 <0.30   BNP: BNP (last 3 results) No results found for this basename: PROBNP:3 in the last 8760 hours CBG:  Lab 08/12/12 1216 08/12/12 0734 08/11/12 2217 08/11/12 2110 08/11/12 1709  GLUCAP 191* 199* 144* 204* 171*  Time coordinating discharge: 40 minutes  Signed:  Kamdin Follett A  [facility] 08/12/2012, 1:44 PM

## 2012-08-14 ENCOUNTER — Emergency Department (HOSPITAL_COMMUNITY)
Admission: EM | Admit: 2012-08-14 | Discharge: 2012-08-15 | Disposition: A | Discharge status: Home / Self Care | Payer: Medicaid Other | Attending: Emergency Medicine | Admitting: Emergency Medicine

## 2012-08-14 DIAGNOSIS — R062 Wheezing: Secondary | ICD-10-CM

## 2012-08-14 DIAGNOSIS — R066 Hiccough: Secondary | ICD-10-CM | POA: Insufficient documentation

## 2012-08-14 DIAGNOSIS — J45909 Unspecified asthma, uncomplicated: Secondary | ICD-10-CM | POA: Insufficient documentation

## 2012-08-14 DIAGNOSIS — E119 Type 2 diabetes mellitus without complications: Secondary | ICD-10-CM | POA: Insufficient documentation

## 2012-08-14 DIAGNOSIS — K219 Gastro-esophageal reflux disease without esophagitis: Secondary | ICD-10-CM | POA: Insufficient documentation

## 2012-08-14 LAB — POCT I-STAT TROPONIN I: Troponin i, poc: 0.01 ng/mL (ref 0.00–0.08)

## 2012-08-14 LAB — CBC
HCT: 42.5 % (ref 39.0–52.0)
Hemoglobin: 15.3 g/dL (ref 13.0–17.0)
MCHC: 36 g/dL (ref 30.0–36.0)
MCV: 88 fL (ref 78.0–100.0)

## 2012-08-14 NOTE — ED Notes (Addendum)
Pt c/o hiccups and burning pain radiating up from stomach into chest and throat. Ongoing since Monday and pain has increased. Pt has taken Maalox, tums and nothing has helped. Denies SOB, C/O nausea. painis described as burning.

## 2012-08-15 LAB — COMPREHENSIVE METABOLIC PANEL
Alkaline Phosphatase: 74 U/L (ref 39–117)
BUN: 23 mg/dL (ref 6–23)
Creatinine, Ser: 1.95 mg/dL — ABNORMAL HIGH (ref 0.50–1.35)
GFR calc Af Amer: 46 mL/min — ABNORMAL LOW (ref >90)
Glucose, Bld: 191 mg/dL — ABNORMAL HIGH (ref 70–99)
Potassium: 4.4 mEq/L (ref 3.5–5.1)
Total Bilirubin: 0.3 mg/dL (ref 0.3–1.2)
Total Protein: 7.6 g/dL (ref 6.0–8.3)

## 2012-08-15 MED ORDER — ALBUTEROL SULFATE (5 MG/ML) 0.5% IN NEBU
5.0000 mg | INHALATION_SOLUTION | Freq: Once | RESPIRATORY_TRACT | Status: AC
  Administered 2012-08-15: 5 mg via RESPIRATORY_TRACT
  Filled 2012-08-15: qty 1

## 2012-08-15 MED ORDER — IPRATROPIUM BROMIDE 0.02 % IN SOLN
0.5000 mg | Freq: Once | RESPIRATORY_TRACT | Status: AC
Start: 2012-08-15 — End: 2012-08-15
  Administered 2012-08-15: 0.5 mg via RESPIRATORY_TRACT
  Filled 2012-08-15: qty 2.5

## 2012-08-15 MED ORDER — CHLORPROMAZINE HCL 10 MG PO TABS
10.0000 mg | ORAL_TABLET | Freq: Once | ORAL | Status: AC
  Administered 2012-08-15: 10 mg via ORAL
  Filled 2012-08-15: qty 1

## 2012-08-15 MED ORDER — GI COCKTAIL ~~LOC~~
30.0000 mL | Freq: Once | ORAL | Status: AC
  Administered 2012-08-15: 30 mL via ORAL
  Filled 2012-08-15: qty 30

## 2012-08-15 MED ORDER — PANTOPRAZOLE SODIUM 20 MG PO TBEC
20.0000 mg | DELAYED_RELEASE_TABLET | Freq: Once | ORAL | Status: AC
  Administered 2012-08-15: 20 mg via ORAL
  Filled 2012-08-15: qty 1

## 2012-08-15 NOTE — ED Provider Notes (Signed)
History     CSN: [id number]  Arrival date & time 08/14/12  2234   None     Chief Complaint  Patient presents with  . Gastrophageal Reflux    (Consider location/radiation/quality/duration/timing/severity/associated sxs/prior treatment) HPI  Patient returns to the emergency department with complaints of hiccups and a burning pain radiating through his stomach, chest and throat. Patient has been dealing with this since Monday in the GERD has been increasing. He has tried taking Maalox, Tums, juice at home and nothing has helped. The patient was recently admitted for an asthma exacerbation and discharged 2 days ago. While in the hospital they did a cardiac 2-D echo to rule out any cardiac involvement and distress of breath. The patient echo was normal. He advises me that he has had chest pain in the past, and this does not feel like the chest pain. He states that he has a significant history of GERD and that is what this feels like. He states that the hiccups are making his asthma flareup, and that he can't get them to stop been he is worried about having another asthma attack. The patient is slightly tachycardic on triage vitals. In the room the patient's vital signs are back to normal with a pulse of 98. The patient is in no acute distress but is hiccuping during exam.   Past Medical History  Diagnosis Date  . Asthma   . Diabetes mellitus     No past surgical history on file.  Family History  Problem Relation Age of Onset  . Diabetes type II Mother     History  Substance Use Topics  . Smoking status: Never Smoker   . Smokeless tobacco: Never Used  . Alcohol Use: No      Review of Systems   HEENT: denies blurry vision or change in hearing PULMONARY: Denies difficulty breathing and SOB CARDIAC: denies chest pain or heart palpitations MUSCULOSKELETAL:  denies being unable to ambulate ABDOMEN AL: denies abdominal pain GU: denies loss of bowel or urinary control NEURO:  denies numbness and tingling in extremities SKIN: no new rashes PSYCH: patient denies anxiety or depression. NECK: Pt denies having neck pain     Allergies  Review of patient's allergies indicates no known allergies.  Home Medications   Current Outpatient Rx  Name Route Sig Dispense Refill  . ALBUTEROL SULFATE HFA 108 (90 BASE) MCG/ACT IN AERS Inhalation Inhale 1-2 puffs into the lungs every 6 (six) hours as needed.    . BUDESONIDE-FORMOTEROL FUMARATE 160-4.5 MCG/ACT IN AERO Inhalation Inhale 2 puffs into the lungs daily.    Marland Kitchen GLIPIZIDE 10 MG PO TABS Oral Take 10 mg by mouth 2 (two) times daily before a meal.      . METFORMIN HCL 1000 MG PO TABS Oral Take 1,000 mg by mouth 2 (two) times daily with a meal.      . MONTELUKAST SODIUM 10 MG PO TABS Oral Take 10 mg by mouth at bedtime.      BP 153/100  Pulse 92  Temp 98.5 F (36.9 C) (Oral)  Resp 22  SpO2 96%  Physical Exam  Nursing note and vitals reviewed. Constitutional: He appears well-developed and well-nourished. No distress.       Pt hiccuping during exam  HENT:  Head: Normocephalic and atraumatic.  Eyes: Pupils are equal, round, and reactive to light.  Neck: Normal range of motion. Neck supple.  Cardiovascular: Normal rate and regular rhythm.   Pulmonary/Chest: Effort normal. No respiratory distress. He  has wheezes. He has no rales. He exhibits no tenderness.  Abdominal: Soft. He exhibits no distension. There is tenderness (epigastric). There is no rebound and no guarding.  Neurological: He is alert.  Skin: Skin is warm and dry.    ED Course  Procedures (including critical care time)  Labs Reviewed  CBC - Abnormal; Notable for the following:    WBC 10.9 (*)     All other components within normal limits  COMPREHENSIVE METABOLIC PANEL - Abnormal; Notable for the following:    Chloride 95 (*)     Glucose, Bld 191 (*)     Creatinine, Ser 1.95 (*)     Calcium 12.1 (*)     Albumin 3.3 (*)     GFR calc non Af Amer  40 (*)     GFR calc Af Amer 46 (*)     All other components within normal limits  POCT I-STAT TROPONIN I  LAB REPORT - SCANNED   No results found.   1. Hiccups   2. GERD (gastroesophageal reflux disease)   3. Wheezing       MDM   Date: 08/15/2012  Rate: 109  Rhythm: normal sinus rhythm  QRS Axis: normal  Intervals: normal  ST/T Wave abnormalities: normal  Conduction Disutrbances:none  Narrative Interpretation:   Old EKG Reviewed: unchanged from May 16, 2009   *[facility] [employer] [place], [place] [address] [phone]  ------------------------------------------------------------ Transthoracic Echocardiography  Patient: Walter, Harmon MR #: [id number] Study Date: 08/11/2012 Gender: M Age: 46 Height: 165.1cm Weight: 81.8kg BSA: 1.25m^2 Pt. Status: Room: [facility] ADMITTING Jani Gravel ATTENDING Abrol, Nayana ORDERING Abrol, Nayana REFERRING [place], [place] SONOGRAPHER Tresa Res, RDCS cc:  ------------------------------------------------------------ LV EF: 60% - 65%  ------------------------------------------------------------ Indications: Shortness of breath 786.05.  ------------------------------------------------------------ History: Risk factors: Asthma. Diabetes mellitus.  ------------------------------------------------------------ Study Conclusions  Left ventricle: The cavity size was normal. Systolic function was normal. The estimated ejection fraction was in the range of 60% to 65%. Wall motion was normal; there were no regional wall motion abnormalities. The study is not technically sufficient to allow evaluation of LV diastolic function. Transthoracic echocardiography. M-mode, complete 2D, spectral Doppler, and color Doppler. Height: Height: 165.1cm. Height: 65in. Weight: Weight: 81.8kg. Weight: 180lb. Body mass index: BMI: 30kg/m^2. Body surface area: BSA: 1.3m^2.  Blood pressure: 126/76. Patient status: Inpatient. Location: Bedside.    The patient was given a GI cocktail, Protonix, now being overall treatment in the emergency department. He states that the medications helped relieve his GERD completely. And with the oxygen and the breathing treatment the patient's hiccups stopped as well. He was observed for a couple hours to assure that his symptoms did not change or recur. The patient remained symptom-free and was discharged home. I recommend that he followup with his primary care Dr. and return to the ER if symptoms change or worsen. Do to the patient just having a normal 2-D echo 2 days ago and a normal EKG without typical cardiac chest pain symptoms, my suspicion for cardiac involvement is very very low.  Pt has been advised of the symptoms that warrant their return to the ED. Patient has voiced understanding and has agreed to follow-up with the PCP or specialist.      Walter Harmon, [place] 08/17/12 [phone]

## 2012-08-20 NOTE — ED Provider Notes (Signed)
Medical screening examination/treatment/procedure(s) were performed by non-physician practitioner and as supervising physician I was immediately available for consultation/collaboration.  Nat Christen, MD 08/20/12 [phone]

## 2012-10-05 ENCOUNTER — Emergency Department (HOSPITAL_COMMUNITY): Payer: Medicaid Other

## 2012-10-05 ENCOUNTER — Emergency Department (HOSPITAL_COMMUNITY)
Admission: EM | Admit: 2012-10-05 | Discharge: 2012-10-05 | Disposition: A | Discharge status: Home / Self Care | Payer: Medicaid Other | Attending: Emergency Medicine | Admitting: Emergency Medicine

## 2012-10-05 ENCOUNTER — Encounter (HOSPITAL_COMMUNITY): Payer: Self-pay | Admitting: Emergency Medicine

## 2012-10-05 DIAGNOSIS — R0789 Other chest pain: Secondary | ICD-10-CM | POA: Insufficient documentation

## 2012-10-05 DIAGNOSIS — J45901 Unspecified asthma with (acute) exacerbation: Secondary | ICD-10-CM | POA: Insufficient documentation

## 2012-10-05 DIAGNOSIS — E119 Type 2 diabetes mellitus without complications: Secondary | ICD-10-CM | POA: Insufficient documentation

## 2012-10-05 DIAGNOSIS — R0602 Shortness of breath: Secondary | ICD-10-CM | POA: Insufficient documentation

## 2012-10-05 MED ORDER — ALBUTEROL SULFATE HFA 108 (90 BASE) MCG/ACT IN AERS: 1.0000 | INHALATION_SPRAY | Freq: Four times a day (QID) | RESPIRATORY_TRACT | Status: DC | PRN

## 2012-10-05 MED ORDER — ALBUTEROL SULFATE (5 MG/ML) 0.5% IN NEBU
5.0000 mg | INHALATION_SOLUTION | Freq: Once | RESPIRATORY_TRACT | Status: AC
  Administered 2012-10-05: 5 mg via RESPIRATORY_TRACT

## 2012-10-05 MED ORDER — DEXAMETHASONE 4 MG PO TABS: 6.0000 mg | ORAL_TABLET | ORAL | Status: DC

## 2012-10-05 MED ORDER — BUDESONIDE-FORMOTEROL FUMARATE 160-4.5 MCG/ACT IN AERO: 2.0000 | INHALATION_SPRAY | Freq: Two times a day (BID) | RESPIRATORY_TRACT | Status: DC

## 2012-10-05 MED ORDER — IPRATROPIUM BROMIDE 0.02 % IN SOLN
0.5000 mg | RESPIRATORY_TRACT | Status: DC
  Administered 2012-10-05: 0.5 mg via RESPIRATORY_TRACT
  Filled 2012-10-05: qty 2.5

## 2012-10-05 MED ORDER — ALBUTEROL SULFATE (5 MG/ML) 0.5% IN NEBU
2.5000 mg | INHALATION_SOLUTION | RESPIRATORY_TRACT | Status: DC
  Administered 2012-10-05: 2.5 mg via RESPIRATORY_TRACT
  Filled 2012-10-05 (×2): qty 20

## 2012-10-05 MED ORDER — DEXAMETHASONE SODIUM PHOSPHATE 10 MG/ML IJ SOLN
10.0000 mg | Freq: Once | INTRAMUSCULAR | Status: AC
  Administered 2012-10-05: 10 mg via INTRAMUSCULAR
  Filled 2012-10-05: qty 1

## 2012-10-05 MED ORDER — ALBUTEROL SULFATE (5 MG/ML) 0.5% IN NEBU
INHALATION_SOLUTION | RESPIRATORY_TRACT | Status: AC
  Filled 2012-10-05: qty 1

## 2012-10-05 MED ORDER — IPRATROPIUM BROMIDE 0.02 % IN SOLN
0.5000 mg | Freq: Once | RESPIRATORY_TRACT | Status: AC
  Administered 2012-10-05: 0.5 mg via RESPIRATORY_TRACT

## 2012-10-05 MED ORDER — IPRATROPIUM BROMIDE 0.02 % IN SOLN
RESPIRATORY_TRACT | Status: AC
  Filled 2012-10-05: qty 2.5

## 2012-10-05 NOTE — ED Notes (Signed)
Pt c/o increased asthma and SOB with wheezing starting yesterday; pt sts out of asthma meds yesterday; pt noted to have labored breathing with wheezing

## 2012-10-05 NOTE — ED Provider Notes (Signed)
History     CSN: [id number]  Arrival date & time 10/05/12  1647   First MD Initiated Contact with Patient 10/05/12 2010      Chief Complaint  Patient presents with  . Asthma    (Consider location/radiation/quality/duration/timing/severity/associated sxs/prior treatment) HPI Comments: 46 year old male presents emergency department with an asthma exacerbation. He ran out of his Symbicort on Thursday, and the next day he began having increased shortness of breath and wheezing. When he has a Symbicort he only needs to use albuterol once a week or every 2 weeks, but since running out of Symbicort he's been using his albuterol every 3-4 hours causing him to run out of it yesterday. He is unable to see his pulmonologist due to having outstanding balance with them. Admits to chest tightness without chest pain. Denies any recent illness. Denies cough, fever, chills, nausea or vomiting. Non smoker. He was hospitalized 2 months ago for 3 days due to an asthma exacerbation. He has never needed to be intubated.  Patient is a 46 y.o. male presenting with asthma. The history is provided by the patient.  Asthma Pertinent negatives include no chest pain, chills, congestion, coughing, diaphoresis, fever, headaches, nausea, neck pain, rash or vomiting.    Past Medical History  Diagnosis Date  . Asthma   . Diabetes mellitus     History reviewed. No pertinent past surgical history.  Family History  Problem Relation Age of Onset  . Diabetes type II Mother     History  Substance Use Topics  . Smoking status: Never Smoker   . Smokeless tobacco: Never Used  . Alcohol Use: No      Review of Systems  Constitutional: Negative for fever, chills and diaphoresis.  HENT: Negative for congestion, facial swelling and neck pain.   Eyes: Negative for visual disturbance.  Respiratory: Positive for chest tightness, shortness of breath and wheezing. Negative for cough.   Cardiovascular: Negative for chest  pain.  Gastrointestinal: Negative for nausea and vomiting.  Musculoskeletal: Negative for back pain.  Skin: Negative for color change and rash.  Neurological: Negative for dizziness, light-headedness and headaches.  Psychiatric/Behavioral: The patient is not nervous/anxious.     Allergies  Review of patient's allergies indicates no known allergies.  Home Medications   Current Outpatient Rx  Name Route Sig Dispense Refill  . ALBUTEROL SULFATE HFA 108 (90 BASE) MCG/ACT IN AERS Inhalation Inhale 1-2 puffs into the lungs every 6 (six) hours as needed.    . BUDESONIDE-FORMOTEROL FUMARATE 160-4.5 MCG/ACT IN AERO Inhalation Inhale 2 puffs into the lungs daily.    Marland Kitchen GLIPIZIDE 10 MG PO TABS Oral Take 10 mg by mouth 2 (two) times daily before a meal.      . METFORMIN HCL 1000 MG PO TABS Oral Take 1,000 mg by mouth 2 (two) times daily with a meal.      . MONTELUKAST SODIUM 10 MG PO TABS Oral Take 10 mg by mouth at bedtime.      BP 153/96  Pulse 108  Resp 24  SpO2 97%  Physical Exam  Nursing note and vitals reviewed. Constitutional: He is oriented to person, place, and time. He appears well-developed and well-nourished. No distress.  HENT:  Head: Normocephalic and atraumatic.  Mouth/Throat: Oropharynx is clear and moist.  Eyes: Conjunctivae normal and EOM are normal.  Neck: Normal range of motion. Neck supple.  Cardiovascular: Regular rhythm, normal heart sounds and intact distal pulses.  Tachycardia present.   Pulmonary/Chest: Effort normal. No accessory  muscle usage or stridor. No respiratory distress. He has wheezes (scattered anterior and posterior bilateral).  Abdominal: Soft. Bowel sounds are normal. There is no tenderness.  Musculoskeletal: Normal range of motion.  Neurological: He is alert and oriented to person, place, and time.  Skin: Skin is warm and dry. No rash noted. He is not diaphoretic.  Psychiatric: He has a normal mood and affect. His behavior is normal.    ED  Course  Procedures (including critical care time)  Labs Reviewed - No data to display Dg Chest 2 View  10/05/2012  *RADIOLOGY REPORT*  Clinical Data: Shortness of breath, asthma, diabetes  CHEST - 2 VIEW  Comparison: 08/10/2012  Findings: Normal heart size and vascularity.  Negative for pneumonia, collapse, consolidation, edema, effusion or pneumothorax.  Trachea midline.  Stable exam.  IMPRESSION: No acute chest finding   Original Report Authenticated By: Jerilynn Mages. Daryll Brod, M.D.      1. Asthma exacerbation       MDM  46 y/o male with known history of asthma presenting with wheezing, sob and chest tightness. Needs symbicort and albuterol. Scattered wheezes present on examination. Will give IM decadron, duo-neb and re-assess. 10:01 PM Patient states he's feeling much better and no longer progress after receiving Decadron and albuterol. His wheezing on exam distal present, however has markedly decreased. He is in no apparent distress. Will give one Decadron to go home with along with his Symbicort and albuterol. Return precautions discussed. Advised to followup with his PCP and pulmonologist.     Illene Labrador, PA-C 10/05/12 2202

## 2012-10-05 NOTE — ED Provider Notes (Signed)
Medical screening examination/treatment/procedure(s) were performed by non-physician practitioner and as supervising physician I was immediately available for consultation/collaboration.  John-Adam Renn Stille, M.D.     John-Adam Aariz Maish, MD 10/05/12 2356 

## 2012-10-05 NOTE — ED Notes (Signed)
Pt states he ran out of his albuterol inhaler.

## 2012-12-13 ENCOUNTER — Emergency Department (HOSPITAL_COMMUNITY): Payer: Medicaid Other

## 2012-12-13 ENCOUNTER — Encounter (HOSPITAL_COMMUNITY): Payer: Self-pay | Admitting: Emergency Medicine

## 2012-12-13 ENCOUNTER — Emergency Department (HOSPITAL_COMMUNITY)
Admission: EM | Admit: 2012-12-13 | Discharge: 2012-12-13 | Disposition: A | Discharge status: Home / Self Care | Payer: Medicaid Other | Attending: Emergency Medicine | Admitting: Emergency Medicine

## 2012-12-13 DIAGNOSIS — J45901 Unspecified asthma with (acute) exacerbation: Secondary | ICD-10-CM

## 2012-12-13 DIAGNOSIS — Z79899 Other long term (current) drug therapy: Secondary | ICD-10-CM | POA: Insufficient documentation

## 2012-12-13 DIAGNOSIS — E119 Type 2 diabetes mellitus without complications: Secondary | ICD-10-CM | POA: Insufficient documentation

## 2012-12-13 MED ORDER — IPRATROPIUM BROMIDE 0.02 % IN SOLN
0.5000 mg | Freq: Once | RESPIRATORY_TRACT | Status: AC
  Administered 2012-12-13: 0.5 mg via RESPIRATORY_TRACT
  Filled 2012-12-13: qty 2.5

## 2012-12-13 MED ORDER — PREDNISONE 10 MG PO TABS: 60.0000 mg | ORAL_TABLET | Freq: Every day | ORAL | Status: DC

## 2012-12-13 MED ORDER — ALBUTEROL SULFATE HFA 108 (90 BASE) MCG/ACT IN AERS: 2.0000 | INHALATION_SPRAY | RESPIRATORY_TRACT | Status: DC | PRN

## 2012-12-13 MED ORDER — ALBUTEROL (5 MG/ML) CONTINUOUS INHALATION SOLN
10.0000 mg/h | INHALATION_SOLUTION | RESPIRATORY_TRACT | Status: AC
  Administered 2012-12-13: 10 mg/h via RESPIRATORY_TRACT

## 2012-12-13 MED ORDER — ALBUTEROL SULFATE (5 MG/ML) 0.5% IN NEBU
5.0000 mg | INHALATION_SOLUTION | Freq: Once | RESPIRATORY_TRACT | Status: AC
Start: 2012-12-13 — End: 2012-12-13
  Administered 2012-12-13: 5 mg via RESPIRATORY_TRACT
  Filled 2012-12-13: qty 1

## 2012-12-13 MED ORDER — METHYLPREDNISOLONE SODIUM SUCC 125 MG IJ SOLR
125.0000 mg | INTRAMUSCULAR | Status: AC
  Administered 2012-12-13: 125 mg via INTRAVENOUS
  Filled 2012-12-13: qty 2

## 2012-12-13 NOTE — ED Notes (Signed)
Pt transported back from [employer].

## 2012-12-13 NOTE — ED Provider Notes (Addendum)
Medical screening examination/treatment/procedure(s) were conducted as a shared visit with non-physician practitioner(s) and myself.  I personally evaluated the patient during the encounter  6:54 AM Patient feels much better at this time.  Home with steroids and albuterol.  Instructed to return to ER for new or worsening symptoms.  I personally reviewed his chest x-ray which demonstrates no acute radiographic cardiopulmonary abnormalities.   Hoy Morn, MD 12/13/12 [place], MD 12/13/12 [phone]

## 2012-12-13 NOTE — ED Provider Notes (Signed)
History     CSN: [id number]  Arrival date & time 12/13/12  0219   First MD Initiated Contact with Patient 12/13/12 [phone]      Chief Complaint  Patient presents with  . Asthma    (Consider location/radiation/quality/duration/timing/severity/associated sxs/prior treatment) HPI Patient presents emergency department with an asthma exacerbation, that started earlier tonight.  Patient, states that he tried using his inhaler, but without relief.  Should ice to fitted, nausea, vomiting, weakness, abdominal pain, fever, cough, headache, nasal congestion, sore throat, back, pain, or dizziness.  Patient, states that he did not take anything else other than his albuterol for his symptoms.  Patient denies any relieving factors or exacerbating factors.  Past Medical History  Diagnosis Date  . Asthma   . Diabetes mellitus     History reviewed. No pertinent past surgical history.  Family History  Problem Relation Age of Onset  . Diabetes type II Mother     History  Substance Use Topics  . Smoking status: Never Smoker   . Smokeless tobacco: Never Used  . Alcohol Use: No      Review of Systems All other systems negative except as documented in the HPI. All pertinent positives and negatives as reviewed in the HPI.  Allergies  Review of patient's allergies indicates no known allergies.  Home Medications   Current Outpatient Rx  Name  Route  Sig  Dispense  Refill  . ALBUTEROL SULFATE HFA 108 (90 BASE) MCG/ACT IN AERS   Inhalation   Inhale 1-2 puffs into the lungs every 6 (six) hours as needed for wheezing.   1 Inhaler   0   . BUDESONIDE-FORMOTEROL FUMARATE 160-4.5 MCG/ACT IN AERO   Inhalation   Inhale 2 puffs into the lungs 2 (two) times daily.   1 Inhaler   12   . GLIPIZIDE 10 MG PO TABS   Oral   Take 10 mg by mouth 2 (two) times daily before a meal.           . METFORMIN HCL 1000 MG PO TABS   Oral   Take 1,000 mg by mouth 2 (two) times daily with a meal.              BP 141/92  Pulse 113  Temp 97.8 F (36.6 C) (Oral)  Resp 24  SpO2 93%  Physical Exam  Nursing note and vitals reviewed. Constitutional: He is oriented to person, place, and time. He appears well-developed and well-nourished. No distress.  HENT:  Head: Normocephalic and atraumatic.  Eyes: Pupils are equal, round, and reactive to light.  Cardiovascular: Normal rate, regular rhythm and normal heart sounds.  Exam reveals no gallop and no friction rub.   No murmur heard. Pulmonary/Chest: Effort normal. No respiratory distress. He has wheezes. He has no rales.  Neurological: He is alert and oriented to person, place, and time.  Skin: Skin is warm and dry. No rash noted.    ED Course  Procedures (including critical care time)  Patient had one regular neb treatment, and I started him on an hour-long continuous neb with steroid injection IV.  Patient, is stable otherwise, and will be reevaluated and is neb treatment.   Clark, PA-C 12/13/12 0532

## 2012-12-13 NOTE — ED Notes (Signed)
Pt presents to the ED with a hx of asthma suffering from shortness of breath.  Pt has expiratory and inspiratory wheezing.  Pt has a prescribed inhaler but has run out of his medicine.  Airway is patent and pt is able to verbalize with some effort.

## 2012-12-29 ENCOUNTER — Emergency Department (HOSPITAL_COMMUNITY): Payer: Medicaid Other

## 2012-12-29 ENCOUNTER — Encounter (HOSPITAL_COMMUNITY): Payer: Self-pay | Admitting: Emergency Medicine

## 2012-12-29 ENCOUNTER — Emergency Department (HOSPITAL_COMMUNITY)
Admission: EM | Admit: 2012-12-29 | Discharge: 2012-12-29 | Disposition: A | Discharge status: Home / Self Care | Payer: Medicaid Other | Attending: Emergency Medicine | Admitting: Emergency Medicine

## 2012-12-29 DIAGNOSIS — Z79899 Other long term (current) drug therapy: Secondary | ICD-10-CM | POA: Insufficient documentation

## 2012-12-29 DIAGNOSIS — E119 Type 2 diabetes mellitus without complications: Secondary | ICD-10-CM | POA: Insufficient documentation

## 2012-12-29 DIAGNOSIS — J45901 Unspecified asthma with (acute) exacerbation: Secondary | ICD-10-CM

## 2012-12-29 MED ORDER — ACETAMINOPHEN 325 MG PO TABS
650.0000 mg | ORAL_TABLET | Freq: Once | ORAL | Status: AC
  Administered 2012-12-29: 650 mg via ORAL
  Filled 2012-12-29: qty 2

## 2012-12-29 MED ORDER — ALBUTEROL SULFATE HFA 108 (90 BASE) MCG/ACT IN AERS
2.0000 | INHALATION_SPRAY | RESPIRATORY_TRACT | Status: DC
  Administered 2012-12-29: 2 via RESPIRATORY_TRACT
  Filled 2012-12-29: qty 6.7

## 2012-12-29 MED ORDER — ALBUTEROL SULFATE (5 MG/ML) 0.5% IN NEBU
5.0000 mg | INHALATION_SOLUTION | Freq: Once | RESPIRATORY_TRACT | Status: AC
  Administered 2012-12-29: 5 mg via RESPIRATORY_TRACT

## 2012-12-29 MED ORDER — IPRATROPIUM BROMIDE 0.02 % IN SOLN
0.5000 mg | Freq: Once | RESPIRATORY_TRACT | Status: AC
  Administered 2012-12-29: 0.5 mg via RESPIRATORY_TRACT
  Filled 2012-12-29: qty 2.5

## 2012-12-29 MED ORDER — ALBUTEROL SULFATE (5 MG/ML) 0.5% IN NEBU
5.0000 mg | INHALATION_SOLUTION | Freq: Once | RESPIRATORY_TRACT | Status: DC
  Filled 2012-12-29: qty 40

## 2012-12-29 MED ORDER — AZITHROMYCIN 250 MG PO TABS: 250.0000 mg | ORAL_TABLET | Freq: Every day | ORAL | Status: DC

## 2012-12-29 MED ORDER — PREDNISONE 20 MG PO TABS
60.0000 mg | ORAL_TABLET | Freq: Once | ORAL | Status: AC
  Administered 2012-12-29: 60 mg via ORAL
  Filled 2012-12-29: qty 3

## 2012-12-29 MED ORDER — ALBUTEROL SULFATE (5 MG/ML) 0.5% IN NEBU
5.0000 mg | INHALATION_SOLUTION | Freq: Once | RESPIRATORY_TRACT | Status: AC
  Administered 2012-12-29: 5 mg via RESPIRATORY_TRACT
  Filled 2012-12-29: qty 40

## 2012-12-29 MED ORDER — PREDNISONE 20 MG PO TABS: 60.0000 mg | ORAL_TABLET | Freq: Every day | ORAL | Status: DC

## 2012-12-29 NOTE — ED Notes (Addendum)
Patient complaining of exacerbation of asthma; reports that he has been taking his albuterol inhaler, that has provided little to no relief.  Patient has audible wheezing in triage.  Patient was recently on Prednisone prescription last week.  Also complaining of productive cough with sputum production.  Denies chest pain.

## 2012-12-29 NOTE — ED Provider Notes (Signed)
History     CSN: [id number]  Arrival date & time 12/29/12  0100   First MD Initiated Contact with Patient 12/29/12 0114      Chief Complaint  Patient presents with  . Asthma  . Shortness of Breath    (Consider location/radiation/quality/duration/timing/severity/associated sxs/prior treatment) HPITerrance A Harmon is a 47 y.o. male history of asthma he says he's been using his albuterol inhaler 5 or 6 times per hour the last day or so with little relief. Patient says he's had a cough, occasionally productive of yellow sputum, no hemoptysis, no chest pain, rhinorrhea, sore throat, no fevers.   Past Medical History  Diagnosis Date  . Asthma   . Diabetes mellitus     History reviewed. No pertinent past surgical history.  Family History  Problem Relation Age of Onset  . Diabetes type II Mother     History  Substance Use Topics  . Smoking status: Never Smoker   . Smokeless tobacco: Never Used  . Alcohol Use: No      Review of Systems At least 10pt or greater review of systems completed and are negative except where specified in the HPI.  Allergies  Review of patient's allergies indicates no known allergies.  Home Medications   Current Outpatient Rx  Name  Route  Sig  Dispense  Refill  . ALBUTEROL SULFATE HFA 108 (90 BASE) MCG/ACT IN AERS   Inhalation   Inhale 1-2 puffs into the lungs every 6 (six) hours as needed for wheezing.   1 Inhaler   0   . ALBUTEROL SULFATE HFA 108 (90 BASE) MCG/ACT IN AERS   Inhalation   Inhale 2 puffs into the lungs every 4 (four) hours as needed for wheezing.   1 Inhaler   0   . BUDESONIDE-FORMOTEROL FUMARATE 160-4.5 MCG/ACT IN AERO   Inhalation   Inhale 2 puffs into the lungs 2 (two) times daily.   1 Inhaler   12   . GLIPIZIDE 10 MG PO TABS   Oral   Take 10 mg by mouth 2 (two) times daily before a meal.           . METFORMIN HCL 1000 MG PO TABS   Oral   Take 1,000 mg by mouth 2 (two) times daily with a meal.           . PREDNISONE 10 MG PO TABS   Oral   Take 6 tablets (60 mg total) by mouth daily.   30 tablet   0     BP 170/101  Pulse 109  Temp 98.5 F (36.9 C) (Oral)  Resp 18  SpO2 96%  Physical Exam  Nursing notes reviewed.  Electronic medical record reviewed. VITAL SIGNS:   Filed Vitals:   12/29/12 0117 12/29/12 0130 12/29/12 0230 12/29/12 0256  BP:  138/103 140/102 133/83  Pulse:  99 93 93  Temp:      TempSrc:      Resp:  18    SpO2: 96% 100% 100% 94%   CONSTITUTIONAL: Awake, oriented, appears non-toxic HENT: Atraumatic, normocephalic, oral mucosa pink and moist, airway patent. Nares patent without drainage. External ears normal. EYES: Conjunctiva clear, EOMI, PERRLA NECK: Trachea midline, non-tender, supple CARDIOVASCULAR: Normal heart rate, Normal rhythm, No murmurs, rubs, gallops PULMONARY/CHEST: Wheezing throughout lung fields, fair air movement, symmetrical breath sounds. Non-tender. ABDOMINAL: Non-distended, soft, non-tender - no rebound or guarding.  BS normal. NEUROLOGIC: Non-focal, moving all four extremities, no gross sensory or motor deficits. EXTREMITIES: No clubbing,  cyanosis, or edema SKIN: Warm, Dry, No erythema, No rash  ED Course  Procedures (including critical care time)  Labs Reviewed - No data to display Dg Chest 2 View  12/29/2012  *RADIOLOGY REPORT*  Clinical Data: Shortness of breath.  Asthma.  CHEST - 2 VIEW  Comparison: PA and lateral chest 12/13/2012.  Findings: Lungs are clear.  Heart size is normal.  No pneumothorax or pleural fluid.  IMPRESSION: Negative chest.   Original Report Authenticated By: Orlean Patten, M.D.      1. Asthma exacerbation       MDM  Walter Harmon is a 48 y.o. male resenting with asthma exacerbation - chest x-ray shows no pneumonia. We'll treat patient with steroids, albuterol.    After treatment, patient is feeling much better, is breathing much better - less wheezing. Personally reviewed the x-rays seeing no  pneumonia or other acute cardiopulmonary abnormalities - discharge patient home stable and good condition with four-day prednisone burst and azithromycin.  I explained the diagnosis and have given explicit precautions to return to the ER including shortness of breath, chest pain or any other new or worsening symptoms. The patient understands and accepts the medical plan as it's been dictated and I have answered their questions. Discharge instructions concerning home care and prescriptions have been given.  The patient is STABLE and is discharged to home in good condition.          Rhunette Croft, MD 12/30/12 [phone]

## 2013-09-09 ENCOUNTER — Encounter: Payer: Self-pay | Admitting: Emergency Medicine

## 2013-09-09 ENCOUNTER — Ambulatory Visit (INDEPENDENT_AMBULATORY_CARE_PROVIDER_SITE_OTHER): Payer: Medicaid Other | Admitting: Emergency Medicine

## 2013-09-09 VITALS — BP 140/90 | HR 81 | Ht 65.0 in | Wt 180.0 lb

## 2013-09-09 DIAGNOSIS — Z23 Encounter for immunization: Secondary | ICD-10-CM

## 2013-09-09 DIAGNOSIS — J449 Chronic obstructive pulmonary disease, unspecified: Secondary | ICD-10-CM

## 2013-09-09 MED ORDER — BUDESONIDE-FORMOTEROL FUMARATE 160-4.5 MCG/ACT IN AERO: 2.0000 | INHALATION_SPRAY | Freq: Two times a day (BID) | RESPIRATORY_TRACT | Status: DC

## 2013-09-09 MED ORDER — PROAIR HFA 108 (90 BASE) MCG/ACT IN AERS: 2.0000 | INHALATION_SPRAY | Freq: Four times a day (QID) | RESPIRATORY_TRACT | Status: DC | PRN

## 2013-09-09 NOTE — Assessment & Plan Note (Signed)
-   need to restart his symbicort + albuterol - restart nasal steroid - restart astelin  - will get his spiro from Dr Donneta Romberg - rov 3 months

## 2013-09-09 NOTE — Patient Instructions (Addendum)
We will restart your Symbicort 2 puffs twice a day. We can consider decreasing to daily at your next visit if you are doing well Please rinse and gargle after you take the Symbicort Use albuterol 2 puffs as needed Restart nasonex 2 sprays each nostril daily We will get your spirometry tests from Dr Donneta Romberg Follow with Dr Lamonte Sakai in 3 months or sooner if you have any problems.

## 2013-09-09 NOTE — Progress Notes (Signed)
Subjective:    Patient ID: Walter Harmon, male    DOB: 10-26-1966, 47 y.o.   MRN: [id number]  HPI 47 yo never smoker, hx DM and asthma dx as a child. Also with rhinitis.  He has been on controller medications since '99, currently Symbicort. Has albuterol available to use prn. Unfortunately has been out of his meds for ~ 2 months. Flares infrequently, but has had more trouble when he runs out of meds. Was started on lisinopril 2 weeks ago, seems to be tolerating. Had spiro w Dr Donneta Romberg in 2011 - 12. Allergies are poorly controlled right now. Used to be on astelin and nasal steroid.    Review of Systems  Constitutional: Negative for fever and unexpected weight change.  HENT: Positive for postnasal drip. Negative for ear pain, nosebleeds, congestion, sore throat, rhinorrhea, sneezing, trouble swallowing, dental problem and sinus pressure.   Eyes: Negative for redness and itching.  Respiratory: Negative for cough, chest tightness, shortness of breath and wheezing.   Cardiovascular: Negative for palpitations and leg swelling.  Gastrointestinal: Negative for nausea and vomiting.  Genitourinary: Negative for dysuria.  Musculoskeletal: Negative for joint swelling.  Skin: Negative for rash.  Neurological: Negative for headaches.  Hematological: Does not bruise/bleed easily.  Psychiatric/Behavioral: Negative for dysphoric mood. The patient is not nervous/anxious.     Past Medical History  Diagnosis Date  . Asthma   . Diabetes mellitus      Family History  Problem Relation Age of Onset  . Diabetes type II Mother      History   Social History  . Marital Status: Divorced    Spouse Name: N/A    Number of Children: 1  . Years of Education: N/A   Occupational History  . self employeed    Social History Main Topics  . Smoking status: Never Smoker   . Smokeless tobacco: Never Used  . Alcohol Use: No  . Drug Use: No  . Sexual Activity: Not on file   Other Topics Concern  . Not  on file   Social History Narrative   Occupation: Herbalist     No Known Allergies   Outpatient Prescriptions Prior to Visit  Medication Sig Dispense Refill  . metFORMIN (GLUCOPHAGE) 1000 MG tablet Take 1,000 mg by mouth daily.       Marland Kitchen albuterol (PROVENTIL HFA;VENTOLIN HFA) 108 (90 BASE) MCG/ACT inhaler Inhale 1-2 puffs into the lungs every 6 (six) hours as needed for wheezing.  1 Inhaler  0  . budesonide-formoterol (SYMBICORT) 160-4.5 MCG/ACT inhaler Inhale 2 puffs into the lungs 2 (two) times daily.  1 Inhaler  12  . azithromycin (ZITHROMAX) 250 MG tablet Take 1 tablet (250 mg total) by mouth daily. Take first 2 tablets together, then 1 every day until finished.  6 tablet  0  . glipiZIDE (GLUCOTROL) 10 MG tablet Take 10 mg by mouth 2 (two) times daily before a meal.        . predniSONE (DELTASONE) 20 MG tablet Take 3 tablets (60 mg total) by mouth daily.  12 tablet  0   No facility-administered medications prior to visit.         Objective:   Physical Exam Filed Vitals:   09/09/13 1515  BP: 140/90  Pulse: 81  Height: 5\' 5"  (1.651 m)  Weight: 180 lb (81.647 kg)  SpO2: 96%   Gen: Pleasant, well-nourished, in no distress,  normal affect  ENT: No lesions,  mouth clear,  oropharynx clear, no postnasal  drip  Neck: No JVD, no TMG, no carotid bruits  Lungs: No use of accessory muscles, no dullness to percussion, clear without rales or rhonchi  Cardiovascular: RRR, heart sounds normal, no murmur or gallops, no peripheral edema  Musculoskeletal: No deformities, no cyanosis or clubbing  Neuro: alert, non focal  Skin: Warm, no lesions or rashes       Assessment & Plan:  Chronic obstructive asthma, unspecified - need to restart his symbicort + albuterol - restart nasal steroid - restart astelin  - will get his spiro from Dr Donneta Romberg - rov 3 months

## 2013-09-09 NOTE — Addendum Note (Signed)
Addended by: Thedore Mins D on: 09/09/2013 04:15 PM   Modules accepted: Orders, Medications

## 2013-09-09 NOTE — Addendum Note (Signed)
Addended by: Thedore Mins D on: 09/09/2013 03:55 PM   Modules accepted: Orders

## 2013-09-27 ENCOUNTER — Other Ambulatory Visit: Payer: Self-pay | Admitting: Nephrology

## 2013-09-27 DIAGNOSIS — N189 Chronic kidney disease, unspecified: Secondary | ICD-10-CM

## 2013-09-30 ENCOUNTER — Other Ambulatory Visit (HOSPITAL_COMMUNITY): Payer: Self-pay | Admitting: Nephrology

## 2013-09-30 DIAGNOSIS — N189 Chronic kidney disease, unspecified: Secondary | ICD-10-CM

## 2013-09-30 DIAGNOSIS — R809 Proteinuria, unspecified: Secondary | ICD-10-CM

## 2013-10-01 ENCOUNTER — Other Ambulatory Visit: Payer: Self-pay | Admitting: Radiology

## 2013-10-04 ENCOUNTER — Ambulatory Visit (HOSPITAL_COMMUNITY)
Admission: RE | Admit: 2013-10-04 | Discharge: 2013-10-04 | Disposition: A | Discharge status: Home / Self Care | Payer: Medicaid Other | Source: Ambulatory Visit | Attending: Nephrology | Admitting: Nephrology

## 2013-10-04 ENCOUNTER — Other Ambulatory Visit: Payer: Medicaid Other

## 2013-10-04 ENCOUNTER — Encounter (HOSPITAL_COMMUNITY): Payer: Self-pay | Admitting: Pharmacy Technician

## 2013-10-04 ENCOUNTER — Other Ambulatory Visit (HOSPITAL_COMMUNITY): Payer: Self-pay | Admitting: Nephrology

## 2013-10-04 VITALS — BP 105/79 | HR 83 | Temp 98.7°F | Resp 16 | Ht 65.0 in | Wt 180.0 lb

## 2013-10-04 DIAGNOSIS — N058 Unspecified nephritic syndrome with other morphologic changes: Secondary | ICD-10-CM | POA: Insufficient documentation

## 2013-10-04 DIAGNOSIS — E1129 Type 2 diabetes mellitus with other diabetic kidney complication: Secondary | ICD-10-CM | POA: Insufficient documentation

## 2013-10-04 DIAGNOSIS — R809 Proteinuria, unspecified: Secondary | ICD-10-CM

## 2013-10-04 DIAGNOSIS — N189 Chronic kidney disease, unspecified: Secondary | ICD-10-CM

## 2013-10-04 DIAGNOSIS — N183 Chronic kidney disease, stage 3 unspecified: Secondary | ICD-10-CM | POA: Insufficient documentation

## 2013-10-04 DIAGNOSIS — J45909 Unspecified asthma, uncomplicated: Secondary | ICD-10-CM | POA: Insufficient documentation

## 2013-10-04 DIAGNOSIS — N400 Enlarged prostate without lower urinary tract symptoms: Secondary | ICD-10-CM | POA: Insufficient documentation

## 2013-10-04 LAB — CBC
HCT: 37.2 % — ABNORMAL LOW (ref 39.0–52.0)
Platelets: 218 10*3/uL (ref 150–400)
RBC: 4.14 MIL/uL — ABNORMAL LOW (ref 4.22–5.81)
RDW: 12.8 % (ref 11.5–15.5)
WBC: 5 10*3/uL (ref 4.0–10.5)

## 2013-10-04 LAB — APTT: aPTT: 30 seconds (ref 24–37)

## 2013-10-04 LAB — GLUCOSE, CAPILLARY
Glucose-Capillary: 158 mg/dL — ABNORMAL HIGH (ref 70–99)
Glucose-Capillary: 130 mg/dL — ABNORMAL HIGH (ref 70–99)

## 2013-10-04 LAB — PROTIME-INR
INR: 0.95 (ref 0.00–1.49)
Prothrombin Time: 12.5 seconds (ref 11.6–15.2)

## 2013-10-04 MED ORDER — SODIUM CHLORIDE 0.9 % IV SOLN: Freq: Once | INTRAVENOUS | Status: DC

## 2013-10-04 MED ORDER — MIDAZOLAM HCL 2 MG/2ML IJ SOLN
INTRAMUSCULAR | Status: AC | PRN
  Administered 2013-10-04: 2 mg via INTRAVENOUS

## 2013-10-04 MED ORDER — MIDAZOLAM HCL 2 MG/2ML IJ SOLN
INTRAMUSCULAR | Status: AC
  Filled 2013-10-04: qty 6

## 2013-10-04 MED ORDER — SODIUM CHLORIDE 0.9 % IV SOLN
INTRAVENOUS | Status: AC | PRN
  Administered 2013-10-04: 50 mL/h via INTRAVENOUS

## 2013-10-04 MED ORDER — FENTANYL CITRATE 0.05 MG/ML IJ SOLN
INTRAMUSCULAR | Status: AC | PRN
  Administered 2013-10-04 (×2): 50 ug via INTRAVENOUS

## 2013-10-04 MED ORDER — FENTANYL CITRATE 0.05 MG/ML IJ SOLN
INTRAMUSCULAR | Status: AC
  Filled 2013-10-04: qty 4

## 2013-10-04 NOTE — Progress Notes (Signed)
Discharge instructions given per MD order.  Pt denies any discomfort at this time.  Pt able to verbalize understanding of instructions.

## 2013-10-04 NOTE — Procedures (Signed)
Technically successful US guided biopsy of inferior pole of the left kidney.  No immediate complications.

## 2013-10-04 NOTE — H&P (Signed)
Walter Harmon is an 47 y.o. male.   Chief Complaint: proteinuria x yrs (CKD stage 3) Recent increase per Nephrologist Pt now scheduled for random renal biopsy HPI: DM; asthma  Past Medical History  Diagnosis Date  . Asthma   . Diabetes mellitus     Past Surgical History  Procedure Laterality Date  . Ankle surgery      Family History  Problem Relation Age of Onset  . Diabetes type II Mother    Social History:  reports that he has never smoked. He has never used smokeless tobacco. He reports that he does not drink alcohol or use illicit drugs.  Allergies: No Known Allergies   (Not in a hospital admission)  Results for orders placed during the hospital encounter of 10/04/13 (from the past 48 hour(s))  GLUCOSE, CAPILLARY     Status: Abnormal   Collection Time    10/04/13  8:58 AM      Result Value Range   Glucose-Capillary 158 (*) 70 - 99 mg/dL   No results found.  Review of Systems  Constitutional: Negative for fever, chills and weight loss.  Respiratory: Negative for cough and shortness of breath.   Cardiovascular: Negative for chest pain.  Gastrointestinal: Negative for nausea, vomiting and abdominal pain.  Genitourinary:       Proteinuria  Musculoskeletal: Negative for back pain.  Neurological: Negative for headaches.    Blood pressure 108/77, pulse 89, temperature 98.4 F (36.9 C), temperature source Oral, resp. rate 18, height 5\' 5"  (1.651 m), weight 180 lb (81.647 kg), SpO2 97.00%. Physical Exam  Constitutional: He is oriented to person, place, and time. He appears well-nourished.  Cardiovascular: Normal rate, regular rhythm and normal heart sounds.   No murmur heard. Respiratory: Effort normal and breath sounds normal. He has no wheezes.  GI: Soft. Bowel sounds are normal. There is no tenderness.  Neurological: He is alert and oriented to person, place, and time.  Skin: Skin is dry.  Psychiatric: He has a normal mood and affect. His behavior is  normal. Judgment and thought content normal.     Assessment/Plan Proteinuria- recent increase Pt now scheduled for random renal biopsy Pt aware of procedure benefits and risks and agreeable to proceed Consent signed and in chart  [place] A 10/04/2013, 9:18 AM

## 2013-10-21 ENCOUNTER — Encounter (HOSPITAL_COMMUNITY): Payer: Self-pay

## 2014-01-10 ENCOUNTER — Telehealth: Payer: Self-pay | Admitting: Emergency Medicine

## 2014-01-10 MED ORDER — PROAIR HFA 108 (90 BASE) MCG/ACT IN AERS: 2.0000 | INHALATION_SPRAY | Freq: Four times a day (QID) | RESPIRATORY_TRACT | Status: DC | PRN

## 2014-01-10 NOTE — Telephone Encounter (Signed)
Pt request Proair sent into [facility].  This has been done and pt is aware. Nothing further is needed

## 2014-01-16 ENCOUNTER — Encounter (HOSPITAL_COMMUNITY): Payer: Self-pay | Admitting: Emergency Medicine

## 2014-01-16 ENCOUNTER — Emergency Department (HOSPITAL_COMMUNITY): Payer: Medicaid Other

## 2014-01-16 DIAGNOSIS — Z79899 Other long term (current) drug therapy: Secondary | ICD-10-CM | POA: Insufficient documentation

## 2014-01-16 DIAGNOSIS — IMO0002 Reserved for concepts with insufficient information to code with codable children: Secondary | ICD-10-CM | POA: Insufficient documentation

## 2014-01-16 DIAGNOSIS — E119 Type 2 diabetes mellitus without complications: Secondary | ICD-10-CM | POA: Insufficient documentation

## 2014-01-16 DIAGNOSIS — J45901 Unspecified asthma with (acute) exacerbation: Secondary | ICD-10-CM | POA: Insufficient documentation

## 2014-01-16 LAB — CBC WITH DIFFERENTIAL/PLATELET
BASOS PCT: 0 % (ref 0–1)
Basophils Absolute: 0 10*3/uL (ref 0.0–0.1)
EOS ABS: 0.5 10*3/uL (ref 0.0–0.7)
Eosinophils Relative: 8 % — ABNORMAL HIGH (ref 0–5)
HEMATOCRIT: 39.3 % (ref 39.0–52.0)
Hemoglobin: 13.6 g/dL (ref 13.0–17.0)
Lymphocytes Relative: 29 % (ref 12–46)
Lymphs Abs: 1.7 10*3/uL (ref 0.7–4.0)
MCH: 31.5 pg (ref 26.0–34.0)
MCHC: 34.6 g/dL (ref 30.0–36.0)
MCV: 91 fL (ref 78.0–100.0)
MONO ABS: 0.5 10*3/uL (ref 0.1–1.0)
MONOS PCT: 9 % (ref 3–12)
Neutro Abs: 3.1 10*3/uL (ref 1.7–7.7)
Neutrophils Relative %: 53 % (ref 43–77)
Platelets: 230 10*3/uL (ref 150–400)
RBC: 4.32 MIL/uL (ref 4.22–5.81)
RDW: 13.2 % (ref 11.5–15.5)
WBC: 5.8 10*3/uL (ref 4.0–10.5)

## 2014-01-16 LAB — BASIC METABOLIC PANEL
BUN: 19 mg/dL (ref 6–23)
CHLORIDE: 103 meq/L (ref 96–112)
CO2: 22 mEq/L (ref 19–32)
CREATININE: 2.35 mg/dL — AB (ref 0.50–1.35)
Calcium: 8.8 mg/dL (ref 8.4–10.5)
GFR calc Af Amer: 37 mL/min — ABNORMAL LOW (ref >90)
GFR, EST NON AFRICAN AMERICAN: 32 mL/min — AB (ref >90)
Glucose, Bld: 168 mg/dL — ABNORMAL HIGH (ref 70–99)
Potassium: 4.4 mEq/L (ref 3.7–5.3)
Sodium: 140 mEq/L (ref 137–147)

## 2014-01-16 MED ORDER — ALBUTEROL SULFATE (2.5 MG/3ML) 0.083% IN NEBU
5.0000 mg | INHALATION_SOLUTION | Freq: Once | RESPIRATORY_TRACT | Status: AC
  Administered 2014-01-16: 5 mg via RESPIRATORY_TRACT

## 2014-01-16 MED ORDER — ALBUTEROL SULFATE (2.5 MG/3ML) 0.083% IN NEBU
INHALATION_SOLUTION | RESPIRATORY_TRACT | Status: DC
Start: 2014-01-16 — End: 2014-01-17
  Filled 2014-01-16: qty 6

## 2014-01-16 NOTE — ED Notes (Addendum)
C/o cough, congestion, cold sx, sob and wheezing. Productive cough, white mucus. Onset Thursday night. Started as head cold and progressively got worse triggering asthma. Denies fever, nvd or pain. LS insp/exp wheezing.

## 2014-01-17 ENCOUNTER — Emergency Department (HOSPITAL_COMMUNITY)
Admission: EM | Admit: 2014-01-17 | Discharge: 2014-01-17 | Disposition: A | Discharge status: Home / Self Care | Payer: Medicaid Other | Attending: Emergency Medicine | Admitting: Emergency Medicine

## 2014-01-17 DIAGNOSIS — J209 Acute bronchitis, unspecified: Secondary | ICD-10-CM

## 2014-01-17 DIAGNOSIS — J45909 Unspecified asthma, uncomplicated: Secondary | ICD-10-CM

## 2014-01-17 MED ORDER — IPRATROPIUM BROMIDE 0.02 % IN SOLN
0.5000 mg | Freq: Once | RESPIRATORY_TRACT | Status: AC
  Administered 2014-01-17: 0.5 mg via RESPIRATORY_TRACT
  Filled 2014-01-17: qty 2.5

## 2014-01-17 MED ORDER — AZITHROMYCIN 250 MG PO TABS: ORAL_TABLET | ORAL | Status: DC

## 2014-01-17 MED ORDER — ALBUTEROL SULFATE (2.5 MG/3ML) 0.083% IN NEBU
5.0000 mg | INHALATION_SOLUTION | Freq: Once | RESPIRATORY_TRACT | Status: AC
  Administered 2014-01-17: 5 mg via RESPIRATORY_TRACT
  Filled 2014-01-17: qty 6

## 2014-01-17 NOTE — ED Provider Notes (Signed)
CSN: [id number]     Arrival date & time 01/16/14  2217 History   First MD Initiated Contact with Patient 01/17/14 0048     Chief Complaint  Patient presents with  . Shortness of Breath   (Consider location/radiation/quality/duration/timing/severity/associated sxs/prior Treatment) HPI This is a 48 year old male with a history of asthma. He is here with a three-day history of "a head cold". By this he means nasal congestion, scratchy throat, cough and wheezing. The scratchy throat has resolved. He's not had a fever. He describes his cough is productive of clear sputum. He states his wheezing was "pretty bad" when he arrived to the hospital. He was given an albuterol 2.5 mg neb treatment on arrival with significant improvement in his dyspnea. He states he is feeling comfortable now.  Past Medical History  Diagnosis Date  . Asthma   . Diabetes mellitus    Past Surgical History  Procedure Laterality Date  . Ankle surgery     Family History  Problem Relation Age of Onset  . Diabetes type II Mother    History  Substance Use Topics  . Smoking status: Never Smoker   . Smokeless tobacco: Never Used  . Alcohol Use: No    Review of Systems  All other systems reviewed and are negative.    Allergies  Review of patient's allergies indicates no known allergies.  Home Medications   Current Outpatient Rx  Name  Route  Sig  Dispense  Refill  . budesonide-formoterol (SYMBICORT) 160-4.5 MCG/ACT inhaler   Inhalation   Inhale 2 puffs into the lungs 2 (two) times daily.   3 Inhaler   3   . DM-Doxylamine-Acetaminophen (NYQUIL COLD & FLU PO)   Oral   Take 30 mLs by mouth every 4 (four) hours as needed (cold).         Marland Kitchen lisinopril (PRINIVIL,ZESTRIL) 20 MG tablet   Oral   Take 20 mg by mouth daily.         . metFORMIN (GLUCOPHAGE) 1000 MG tablet   Oral   Take 1,000 mg by mouth daily.          . mometasone (NASONEX) 50 MCG/ACT nasal spray   Nasal   Place 2 sprays into the nose  daily.         . pravastatin (PRAVACHOL) 20 MG tablet   Oral   Take 20 mg by mouth daily.         Marland Kitchen PROAIR HFA 108 (90 BASE) MCG/ACT inhaler   Inhalation   Inhale 2 puffs into the lungs every 6 (six) hours as needed for wheezing.   3 Inhaler   3     Dispense as written.    BP 142/82  Pulse 98  Temp(Src) 98.2 F (36.8 C) (Oral)  Resp 20  Ht 5\' 5"  (1.651 m)  Wt 186 lb (84.369 kg)  BMI 30.95 kg/m2  SpO2 96%  Physical Exam General: Well-developed, well-nourished male in no acute distress; appearance consistent with age of record HENT: normocephalic; atraumatic Eyes: pupils equal, round and reactive to light; extraocular muscles intact Neck: supple Heart: regular rate and rhythm Lungs: Expiratory wheezing Abdomen: soft; nondistended; nontende Extremities: No deformity; full range of motion; pulses normal Neurologic: Awake, alert and oriented; motor function intact in all extremities and symmetric; no facial droop Skin: Warm and dry Psychiatric: Normal mood and affect    ED Course  Procedures (including critical care time)   MDM   Nursing notes and vitals signs, including pulse oximetry,  reviewed.  Summary of this visit's results, reviewed by myself:  Labs:  Results for orders placed during the hospital encounter of 01/17/14 (from the past 24 hour(s))  BASIC METABOLIC PANEL     Status: Abnormal   Collection Time    01/16/14 11:05 PM      Result Value Range   Sodium 140  137 - 147 mEq/L   Potassium 4.4  3.7 - 5.3 mEq/L   Chloride 103  96 - 112 mEq/L   CO2 22  19 - 32 mEq/L   Glucose, Bld 168 (*) 70 - 99 mg/dL   BUN 19  6 - 23 mg/dL   Creatinine, Ser 2.35 (*) 0.50 - 1.35 mg/dL   Calcium 8.8  8.4 - 10.5 mg/dL   GFR calc non Af Amer 32 (*) >90 mL/min   GFR calc Af Amer 37 (*) >90 mL/min  CBC WITH DIFFERENTIAL     Status: Abnormal   Collection Time    01/16/14 11:05 PM      Result Value Range   WBC 5.8  4.0 - 10.5 K/uL   RBC 4.32  4.22 - 5.81 MIL/uL    Hemoglobin 13.6  13.0 - 17.0 g/dL   HCT 39.3  39.0 - 52.0 %   MCV 91.0  78.0 - 100.0 fL   MCH 31.5  26.0 - 34.0 pg   MCHC 34.6  30.0 - 36.0 g/dL   RDW 13.2  11.5 - 15.5 %   Platelets 230  150 - 400 K/uL   Neutrophils Relative % 53  43 - 77 %   Neutro Abs 3.1  1.7 - 7.7 K/uL   Lymphocytes Relative 29  12 - 46 %   Lymphs Abs 1.7  0.7 - 4.0 K/uL   Monocytes Relative 9  3 - 12 %   Monocytes Absolute 0.5  0.1 - 1.0 K/uL   Eosinophils Relative 8 (*) 0 - 5 %   Eosinophils Absolute 0.5  0.0 - 0.7 K/uL   Basophils Relative 0  0 - 1 %   Basophils Absolute 0.0  0.0 - 0.1 K/uL    Imaging Studies: Dg Chest 2 View  01/16/2014   CLINICAL DATA:  Cough, congestion.  EXAM: CHEST  2 VIEW  COMPARISON:  12/29/2012, 12/13/2012  FINDINGS: The heart size and mediastinal contours are within normal limits. Both lungs are clear. The visualized skeletal structures are unremarkable.  IMPRESSION: No active cardiopulmonary disease.   Electronically Signed   By: Daryll Brod M.D.   On: 01/16/2014 23:42   2:04 AM Lungs nearly completely clear after second neb treatment. Patient feels he is back to his baseline.     Wynetta Fines, MD 01/17/14 [phone]

## 2014-01-17 NOTE — Discharge Instructions (Signed)
Acute Bronchitis Bronchitis is inflammation of the airways that extend from the windpipe into the lungs (bronchi). The inflammation often causes mucus to develop. This leads to a cough, which is the most common symptom of bronchitis.  In acute bronchitis, the condition usually develops suddenly and goes away over time, usually in a couple weeks. Smoking, allergies, and asthma can make bronchitis worse. Repeated episodes of bronchitis may cause further lung problems.  CAUSES Acute bronchitis is most often caused by the same virus that causes a cold. The virus can spread from person to person (contagious).  SIGNS AND SYMPTOMS   Cough.   Fever.   Coughing up mucus.   Body aches.   Chest congestion.   Chills.   Shortness of breath.   Sore throat.  DIAGNOSIS  Acute bronchitis is usually diagnosed through a physical exam. Tests, such as chest X-rays, are sometimes done to rule out other conditions.  TREATMENT  Acute bronchitis usually goes away in a couple weeks. Often times, no medical treatment is necessary. Medicines are sometimes given for relief of fever or cough. Antibiotics are usually not needed but may be prescribed in certain situations. In some cases, an inhaler may be recommended to help reduce shortness of breath and control the cough. A cool mist vaporizer may also be used to help thin bronchial secretions and make it easier to clear the chest.  HOME CARE INSTRUCTIONS  Get plenty of rest.   Drink enough fluids to keep your urine clear or pale yellow (unless you have a medical condition that requires fluid restriction). Increasing fluids may help thin your secretions and will prevent dehydration.   Only take over-the-counter or prescription medicines as directed by your health care provider.   Avoid smoking and secondhand smoke. Exposure to cigarette smoke or irritating chemicals will make bronchitis worse. If you are a smoker, consider using nicotine gum or skin  patches to help control withdrawal symptoms. Quitting smoking will help your lungs heal faster.   Reduce the chances of another bout of acute bronchitis by washing your hands frequently, avoiding people with cold symptoms, and trying not to touch your hands to your mouth, nose, or eyes.   Follow up with your health care provider as directed.  SEEK MEDICAL CARE IF: Your symptoms do not improve after 1 week of treatment.  SEEK IMMEDIATE MEDICAL CARE IF:  You develop an increased fever or chills.   You have chest pain.   You have severe shortness of breath.  You have bloody sputum.   You develop dehydration.  You develop fainting.  You develop repeated vomiting.  You develop a severe headache. MAKE SURE YOU:   Understand these instructions.  Will watch your condition.  Will get help right away if you are not doing well or get worse. Document Released: 01/16/2005 Document Revised: 08/11/2013 Document Reviewed: 06/01/2013 [employer] Patient Information 2014 [employer].  

## 2014-01-19 ENCOUNTER — Telehealth: Payer: Self-pay | Admitting: Emergency Medicine

## 2014-01-19 NOTE — Telephone Encounter (Signed)
Spoke with the pt and he states that he went to the ER for sob and was prescribed zpak and advised to f/u here. Appt set for 01-24-14. Advised to continue meds and if symptoms do not improve to call us.Elroy Bing, CMA

## 2014-01-23 ENCOUNTER — Encounter (HOSPITAL_COMMUNITY): Payer: Self-pay | Admitting: Emergency Medicine

## 2014-01-23 ENCOUNTER — Inpatient Hospital Stay (HOSPITAL_COMMUNITY)
Admission: EM | Admit: 2014-01-23 | Discharge: 2014-01-26 | DRG: 189 | Disposition: A | Discharge status: Home / Self Care | Payer: Medicaid Other | Attending: Internal Medicine | Admitting: Internal Medicine

## 2014-01-23 DIAGNOSIS — IMO0002 Reserved for concepts with insufficient information to code with codable children: Secondary | ICD-10-CM

## 2014-01-23 DIAGNOSIS — B9789 Other viral agents as the cause of diseases classified elsewhere: Secondary | ICD-10-CM | POA: Diagnosis present

## 2014-01-23 DIAGNOSIS — K219 Gastro-esophageal reflux disease without esophagitis: Secondary | ICD-10-CM | POA: Diagnosis present

## 2014-01-23 DIAGNOSIS — N183 Chronic kidney disease, stage 3 unspecified: Secondary | ICD-10-CM | POA: Diagnosis present

## 2014-01-23 DIAGNOSIS — K602 Anal fissure, unspecified: Secondary | ICD-10-CM

## 2014-01-23 DIAGNOSIS — T486X5A Adverse effect of antiasthmatics, initial encounter: Secondary | ICD-10-CM | POA: Diagnosis not present

## 2014-01-23 DIAGNOSIS — E785 Hyperlipidemia, unspecified: Secondary | ICD-10-CM | POA: Diagnosis present

## 2014-01-23 DIAGNOSIS — E119 Type 2 diabetes mellitus without complications: Secondary | ICD-10-CM | POA: Diagnosis present

## 2014-01-23 DIAGNOSIS — Z833 Family history of diabetes mellitus: Secondary | ICD-10-CM

## 2014-01-23 DIAGNOSIS — R0902 Hypoxemia: Secondary | ICD-10-CM

## 2014-01-23 DIAGNOSIS — J9600 Acute respiratory failure, unspecified whether with hypoxia or hypercapnia: Principal | ICD-10-CM | POA: Diagnosis present

## 2014-01-23 DIAGNOSIS — J45901 Unspecified asthma with (acute) exacerbation: Secondary | ICD-10-CM | POA: Diagnosis present

## 2014-01-23 DIAGNOSIS — N179 Acute kidney failure, unspecified: Secondary | ICD-10-CM | POA: Diagnosis present

## 2014-01-23 DIAGNOSIS — Z79899 Other long term (current) drug therapy: Secondary | ICD-10-CM

## 2014-01-23 DIAGNOSIS — N032 Chronic nephritic syndrome with diffuse membranous glomerulonephritis: Secondary | ICD-10-CM | POA: Diagnosis present

## 2014-01-23 DIAGNOSIS — J449 Chronic obstructive pulmonary disease, unspecified: Secondary | ICD-10-CM

## 2014-01-23 DIAGNOSIS — R000 Tachycardia, unspecified: Secondary | ICD-10-CM | POA: Diagnosis not present

## 2014-01-23 DIAGNOSIS — I129 Hypertensive chronic kidney disease with stage 1 through stage 4 chronic kidney disease, or unspecified chronic kidney disease: Secondary | ICD-10-CM | POA: Diagnosis present

## 2014-01-23 DIAGNOSIS — R1013 Epigastric pain: Secondary | ICD-10-CM | POA: Diagnosis not present

## 2014-01-23 DIAGNOSIS — J45909 Unspecified asthma, uncomplicated: Secondary | ICD-10-CM | POA: Diagnosis present

## 2014-01-23 HISTORY — DX: Essential (primary) hypertension: I10

## 2014-01-23 LAB — LIPID PANEL
Cholesterol: 183 mg/dL (ref 0–200)
HDL: 35 mg/dL — AB (ref >39)
LDL Cholesterol: 129 mg/dL — ABNORMAL HIGH (ref 0–99)
TRIGLYCERIDES: 96 mg/dL (ref <150)
Total CHOL/HDL Ratio: 5.2 RATIO
VLDL: 19 mg/dL (ref 0–40)

## 2014-01-23 LAB — CBC WITH DIFFERENTIAL/PLATELET
BASOS ABS: 0 10*3/uL (ref 0.0–0.1)
BASOS PCT: 0 % (ref 0–1)
EOS ABS: 0.1 10*3/uL (ref 0.0–0.7)
Eosinophils Relative: 2 % (ref 0–5)
HCT: 42.7 % (ref 39.0–52.0)
Hemoglobin: 14.8 g/dL (ref 13.0–17.0)
Lymphocytes Relative: 10 % — ABNORMAL LOW (ref 12–46)
Lymphs Abs: 0.9 10*3/uL (ref 0.7–4.0)
MCH: 31.6 pg (ref 26.0–34.0)
MCHC: 34.7 g/dL (ref 30.0–36.0)
MCV: 91.2 fL (ref 78.0–100.0)
Monocytes Absolute: 0.1 10*3/uL (ref 0.1–1.0)
Monocytes Relative: 1 % — ABNORMAL LOW (ref 3–12)
NEUTROS ABS: 7.8 10*3/uL — AB (ref 1.7–7.7)
NEUTROS PCT: 87 % — AB (ref 43–77)
PLATELETS: 228 10*3/uL (ref 150–400)
RBC: 4.68 MIL/uL (ref 4.22–5.81)
RDW: 13.3 % (ref 11.5–15.5)
WBC: 9 10*3/uL (ref 4.0–10.5)

## 2014-01-23 LAB — POCT I-STAT, CHEM 8
BUN: 21 mg/dL (ref 6–23)
Calcium, Ion: 1.21 mmol/L (ref 1.12–1.23)
Chloride: 103 mEq/L (ref 96–112)
Creatinine, Ser: 2.2 mg/dL — ABNORMAL HIGH (ref 0.50–1.35)
Glucose, Bld: 228 mg/dL — ABNORMAL HIGH (ref 70–99)
HEMATOCRIT: 47 % (ref 39.0–52.0)
Hemoglobin: 16 g/dL (ref 13.0–17.0)
Potassium: 4.1 mEq/L (ref 3.7–5.3)
Sodium: 140 mEq/L (ref 137–147)
TCO2: 25 mmol/L (ref 0–100)

## 2014-01-23 LAB — GLUCOSE, CAPILLARY
Glucose-Capillary: 190 mg/dL — ABNORMAL HIGH (ref 70–99)
Glucose-Capillary: 227 mg/dL — ABNORMAL HIGH (ref 70–99)

## 2014-01-23 LAB — STREP PNEUMONIAE URINARY ANTIGEN: STREP PNEUMO URINARY ANTIGEN: NEGATIVE

## 2014-01-23 LAB — SODIUM, URINE, RANDOM: Sodium, Ur: 55 mEq/L

## 2014-01-23 LAB — CREATININE, URINE, RANDOM: Creatinine, Urine: 212.82 mg/dL

## 2014-01-23 MED ORDER — FLUTICASONE PROPIONATE 50 MCG/ACT NA SUSP
1.0000 | Freq: Every day | NASAL | Status: DC
  Administered 2014-01-24 – 2014-01-26 (×3): 1 via NASAL
  Filled 2014-01-23 (×2): qty 16

## 2014-01-23 MED ORDER — ENOXAPARIN SODIUM 40 MG/0.4ML ~~LOC~~ SOLN
40.0000 mg | SUBCUTANEOUS | Status: DC
  Administered 2014-01-24 – 2014-01-25 (×2): 40 mg via SUBCUTANEOUS
  Filled 2014-01-23 (×4): qty 0.4

## 2014-01-23 MED ORDER — PREDNISONE 20 MG PO TABS
60.0000 mg | ORAL_TABLET | Freq: Once | ORAL | Status: AC
  Administered 2014-01-23: 60 mg via ORAL
  Filled 2014-01-23: qty 3

## 2014-01-23 MED ORDER — LEVOFLOXACIN 750 MG PO TABS
750.0000 mg | ORAL_TABLET | ORAL | Status: DC
  Administered 2014-01-25: 750 mg via ORAL
  Filled 2014-01-23: qty 1

## 2014-01-23 MED ORDER — IPRATROPIUM-ALBUTEROL 0.5-2.5 (3) MG/3ML IN SOLN
3.0000 mL | Freq: Once | RESPIRATORY_TRACT | Status: AC
  Administered 2014-01-23: 3 mL via RESPIRATORY_TRACT
  Filled 2014-01-23: qty 3

## 2014-01-23 MED ORDER — BUDESONIDE-FORMOTEROL FUMARATE 160-4.5 MCG/ACT IN AERO
2.0000 | INHALATION_SPRAY | Freq: Two times a day (BID) | RESPIRATORY_TRACT | Status: DC
  Administered 2014-01-23 – 2014-01-24 (×2): 2 via RESPIRATORY_TRACT
  Filled 2014-01-23: qty 6

## 2014-01-23 MED ORDER — CYCLOSPORINE MODIFIED (NEORAL) 25 MG PO CAPS
150.0000 mg | ORAL_CAPSULE | Freq: Every morning | ORAL | Status: DC
  Administered 2014-01-24 – 2014-01-25 (×2): 150 mg via ORAL
  Filled 2014-01-23 (×2): qty 2

## 2014-01-23 MED ORDER — METHYLPREDNISOLONE SODIUM SUCC 125 MG IJ SOLR
40.0000 mg | Freq: Four times a day (QID) | INTRAMUSCULAR | Status: DC
  Administered 2014-01-23: 40 mg via INTRAVENOUS
  Filled 2014-01-23: qty 2
  Filled 2014-01-23 (×2): qty 0.64

## 2014-01-23 MED ORDER — INSULIN ASPART 100 UNIT/ML ~~LOC~~ SOLN
0.0000 [IU] | Freq: Three times a day (TID) | SUBCUTANEOUS | Status: DC
  Administered 2014-01-23: 3 [IU] via SUBCUTANEOUS
  Administered 2014-01-24: 5 [IU] via SUBCUTANEOUS
  Administered 2014-01-24: 2 [IU] via SUBCUTANEOUS
  Administered 2014-01-24: 3 [IU] via SUBCUTANEOUS
  Administered 2014-01-25 (×3): 2 [IU] via SUBCUTANEOUS
  Administered 2014-01-26: 3 [IU] via SUBCUTANEOUS
  Filled 2014-01-23: qty 1

## 2014-01-23 MED ORDER — SIMVASTATIN 10 MG PO TABS
10.0000 mg | ORAL_TABLET | Freq: Every day | ORAL | Status: DC
Start: 2014-01-23 — End: 2014-01-24
  Filled 2014-01-23 (×2): qty 1

## 2014-01-23 MED ORDER — SODIUM CHLORIDE 0.9 % IV SOLN
INTRAVENOUS | Status: DC
  Administered 2014-01-23: 18:00:00 via INTRAVENOUS

## 2014-01-23 MED ORDER — ALBUTEROL SULFATE (2.5 MG/3ML) 0.083% IN NEBU
2.5000 mg | INHALATION_SOLUTION | Freq: Four times a day (QID) | RESPIRATORY_TRACT | Status: DC
  Administered 2014-01-23 – 2014-01-26 (×9): 2.5 mg via RESPIRATORY_TRACT
  Filled 2014-01-23 (×11): qty 3

## 2014-01-23 MED ORDER — HYDRALAZINE HCL 10 MG PO TABS
10.0000 mg | ORAL_TABLET | Freq: Three times a day (TID) | ORAL | Status: DC
  Administered 2014-01-24 – 2014-01-26 (×7): 10 mg via ORAL
  Filled 2014-01-23 (×11): qty 1

## 2014-01-23 MED ORDER — ALBUTEROL (5 MG/ML) CONTINUOUS INHALATION SOLN
15.0000 mg | INHALATION_SOLUTION | Freq: Once | RESPIRATORY_TRACT | Status: AC
  Administered 2014-01-23: 15 mg via RESPIRATORY_TRACT
  Filled 2014-01-23: qty 20

## 2014-01-23 MED ORDER — LEVOFLOXACIN 250 MG PO TABS: 750.0000 mg | ORAL_TABLET | ORAL | Status: DC

## 2014-01-23 MED ORDER — ALBUTEROL SULFATE (2.5 MG/3ML) 0.083% IN NEBU: 2.5000 mg | INHALATION_SOLUTION | RESPIRATORY_TRACT | Status: DC | PRN

## 2014-01-23 MED ORDER — METHYLPREDNISOLONE SODIUM SUCC 40 MG IJ SOLR
40.0000 mg | Freq: Four times a day (QID) | INTRAMUSCULAR | Status: DC
  Administered 2014-01-23 – 2014-01-25 (×7): 40 mg via INTRAVENOUS
  Filled 2014-01-23 (×10): qty 1

## 2014-01-23 MED ORDER — FAMOTIDINE 20 MG PO TABS
20.0000 mg | ORAL_TABLET | Freq: Once | ORAL | Status: AC
  Administered 2014-01-24: 20 mg via ORAL
  Filled 2014-01-23 (×2): qty 1

## 2014-01-23 MED ORDER — CALCIUM CARBONATE ANTACID 500 MG PO CHEW
1.0000 | CHEWABLE_TABLET | Freq: Once | ORAL | Status: AC
  Administered 2014-01-23: 200 mg via ORAL
  Filled 2014-01-23: qty 1

## 2014-01-23 MED ORDER — PNEUMOCOCCAL VAC POLYVALENT 25 MCG/0.5ML IJ INJ
0.5000 mL | INJECTION | INTRAMUSCULAR | Status: AC
  Administered 2014-01-24: 0.5 mL via INTRAMUSCULAR
  Filled 2014-01-23: qty 0.5

## 2014-01-23 NOTE — H&P (Signed)
[facility] History and Physical  Walter Harmon [id number] DOB: Jan 06, 1966 DOA: 01/23/2014  Referring physician: ED physician PCP: Elizabeth Palau, MD   Chief Complaint: shortness of breath   HPI:  48 y.o. Male with HTN, HLD, diabetes, asthma who presents to [facility] ED with main concern of several days duration of progressively worsening shortness of breath, worse with exertion and with no specific alleviating factors including use of albuterol inhaler at home. Pt also reports associated subjective fevers, chills, cough productive of yellow sputum, wheezing, malaise. Pt explains he was in ED several days prior to this admission for the same concern and was given Zithromax with no significant improvement in his symptoms. Pt denies known sick contacts of exposures, he sees Dr. Lamonte Sakai with PCCM.   In ED, pt found to be hypoxic with oxygen saturations in 80s, requiring oxygen and albuterol nebulizer. He has responded well but wheezing persisted and [facility] asked to admit for further evaluation and management of presumptive asthma exacerbation. CXR is pending at the time of the admission.    Assessment and Plan: Active Problems: Acute hypoxic respiratory failure secondary to acute asthma exacerbation - will admit to telemetry bed due to tachycardia - will place on Solumedrol, BD's scheduled and as needed, empiric ABX - follow upon sputum analysis  HTN - stop lisinopril due to acute renal failure - will place on Hydralazine for now - would not discharge on lisinopril unless Cr WNL prior to discharge Diabetes mellitus - will check A1C  - hold metformin due to ARF  - place on SSI in the meantime  HLD - check lipid panel and continue statin as per home medical regimen  Acute on chronic renal failure  - will stop Lisinopril - will also stop Metformin as it is contraindicated in ARF with Cr > 1.2 - place on IVF and repeat BMP in AM - check urine sodium and creatinine    Code  Status: Full Family Communication: Pt at bedside Disposition Plan: Admit to telemetry bed   Review of Systems:  Constitutional: Negative for diaphoresis.  HENT: Negative for hearing loss, ear pain, nosebleeds, congestion, sore throat, neck pain, tinnitus and ear discharge.   Eyes: Negative for blurred vision, double vision, photophobia, pain, discharge and redness.  Respiratory: Per HPI  Cardiovascular: Negative for palpitations, orthopnea, claudication and leg swelling.  Gastrointestinal: Negative for nausea, vomiting and abdominal pain. Negative for heartburn, constipation, blood in stool and melena.  Genitourinary: Negative for dysuria, urgency, frequency, hematuria and flank pain.  Musculoskeletal: Negative for myalgias, back pain, joint pain and falls.  Skin: Negative for itching and rash.  Neurological: Negative for tingling, tremors, sensory change, speech change, focal weakness, loss of consciousness and headaches.  Endo/Heme/Allergies: Negative for environmental allergies and polydipsia. Does not bruise/bleed easily.  Psychiatric/Behavioral: Negative for suicidal ideas. The patient is not nervous/anxious.      Past Medical History  Diagnosis Date  . Asthma   . Diabetes mellitus     Past Surgical History  Procedure Laterality Date  . Ankle surgery      Social History:  reports that he has never smoked. He has never used smokeless tobacco. He reports that he does not drink alcohol or use illicit drugs.  No Known Allergies  Family History  Problem Relation Age of Onset  . Diabetes type II Mother     Medication Sig  budesonide-formoterol (SYMBICORT) 160-4.5 MCG/ACT inhaler Inhale 2 puffs into the lungs 2 (two) times daily.  lisinopril 20 MG tablet  Take 20 mg by mouth daily.  metFORMIN 1000 MG tablet Take 1,000 mg by mouth daily.   mometasone 50 MCG/ACT nasal spray Place 2 sprays into the nose daily.  pravastatin  20 MG tablet Take 20 mg by mouth daily.  PROAIR HFA  108 MCG/ACT inhaler Inhale 2 puffs into the lungs every 6 (six) hours as needed for wheezing.   Physical Exam: Filed Vitals:   01/23/14 1119 01/23/14 1243 01/23/14 1514 01/23/14 1520  BP:   144/87   Pulse:  100 111 125  Temp:   97.1 F (36.2 C)   TempSrc:   Oral   Resp:  20 20   Height:      Weight:      SpO2: 95% 96% 97% 84%   Physical Exam  Constitutional: Appears well-developed and well-nourished. No distress.  HENT: Normocephalic. External right and left ear normal. Oropharynx is clear and moist.  Eyes: Conjunctivae and EOM are normal. PERRLA, no scleral icterus.  Neck: Normal ROM. Neck supple. No JVD. No tracheal deviation. No thyromegaly.  CVS: Regular rhythm tachycardic, S1/S2 +, no murmurs, no gallops, no carotid bruit.  Pulmonary: Effort and breath sounds normal, diminished breath sounds at bases, expiratory wheezing  Abdominal: Soft. BS +,  no distension, tenderness, rebound or guarding.  Musculoskeletal: Normal range of motion. No edema and no tenderness.  Lymphadenopathy: No lymphadenopathy noted, cervical, inguinal. Neuro: Alert. Normal reflexes, muscle tone coordination. No cranial nerve deficit. Skin: Skin is warm and dry. No rash noted. Not diaphoretic. No erythema. No pallor.  Psychiatric: Normal mood and affect. Behavior, judgment, thought content normal.   Labs on Admission:  Basic Metabolic Panel:  Recent Labs Lab 01/16/14 2305 01/23/14 1554  NA 140 140  K 4.4 4.1  CL 103 103  CO2 22  --   GLUCOSE 168* 228*  BUN 19 21  CREATININE 2.35* 2.20*  CALCIUM 8.8  --    CBC:  Recent Labs Lab 01/16/14 2305 01/23/14 1540 01/23/14 1554  WBC 5.8 9.0  --   NEUTROABS 3.1 7.8*  --   HGB 13.6 14.8 16.0  HCT 39.3 42.7 47.0  MCV 91.0 91.2  --   PLT 230 228  --    Radiological Exams on Admission: No results found.  EKG: Normal sinus rhythm, no ST/T wave changes  Faye Ramsay, MD  [facility] Pager [phone]  If 7PM-7AM, please contact  night-coverage [email or web address] Password [facility] 01/23/2014, 4:45 PM

## 2014-01-23 NOTE — ED Notes (Signed)
To ED for continued wheezing and coughing. Pt seen in ED on 1/26 with same complaint and given scripts and inhaler. Used 'all but 26 puffs of my inhaler'. Speaking in full sentences but with noted wheezing. Denies fevers or pain

## 2014-01-23 NOTE — ED Notes (Signed)
Respiratory called to administer neb treatment

## 2014-01-23 NOTE — ED Provider Notes (Signed)
CSN: [id number]     Arrival date & time 01/23/14  1058 History  This chart was scribed for Junius Creamer, NP, working with Idelia Salm, MD, by Sydell Axon, ED Scribe. This patient was seen in room TR08C/TR08C and the patient's care was started at 4:36 PM.  Chief Complaint  Patient presents with  . Asthma   The history is provided by the patient. No language interpreter was used.    HPI Comments: Walter Harmon is a 48 y.o. Male, with a history of asthma, who presents to the Emergency Department complaining of constant, non-changing cough with associated wheezes. Patient was seen in the ED six days ago for similar complaints, where he was prescribed azythromycin; however, his symptoms have persisted since then with no relief. He states having used the majority of his budesonide-formoterol inhaler, as per his triage notes. Patient denies any fever or pain. Patient has not taken steroid medication for one year due to interactions with his current medications. He currently sees Dr. Obie Dredge, for his pulmonary symptoms and has an appointment with him scheduled for tomorrow. Patient has a history of DM. He is a non-smoker.  Past Medical History  Diagnosis Date  . Asthma   . Diabetes mellitus    Past Surgical History  Procedure Laterality Date  . Ankle surgery     Family History  Problem Relation Age of Onset  . Diabetes type II Mother    History  Substance Use Topics  . Smoking status: Never Smoker   . Smokeless tobacco: Never Used  . Alcohol Use: No    Review of Systems  HENT: Negative for sore throat and voice change.   Respiratory: Positive for shortness of breath and wheezing.   Cardiovascular: Negative for chest pain.  All other systems reviewed and are negative.   Allergies  Review of patient's allergies indicates no known allergies.  Home Medications   Current Outpatient Rx  Name  Route  Sig  Dispense  Refill  . budesonide-formoterol (SYMBICORT) 160-4.5 MCG/ACT  inhaler   Inhalation   Inhale 2 puffs into the lungs 2 (two) times daily.   3 Inhaler   3   . cycloSPORINE modified (NEORAL) 50 MG capsule   Oral   Take 150 mg by mouth every morning. Prescribed for 150mg  twice daily. Patient takes 150mg  once daily in the morning. The evening dose causes his reflux to act up.         Marland Kitchen lisinopril (PRINIVIL,ZESTRIL) 20 MG tablet   Oral   Take 20 mg by mouth daily.         . metFORMIN (GLUCOPHAGE) 1000 MG tablet   Oral   Take 1,000 mg by mouth daily.          . mometasone (NASONEX) 50 MCG/ACT nasal spray   Nasal   Place 2 sprays into the nose daily.         . pravastatin (PRAVACHOL) 20 MG tablet   Oral   Take 20 mg by mouth daily.         Marland Kitchen PROAIR HFA 108 (90 BASE) MCG/ACT inhaler   Inhalation   Inhale 2 puffs into the lungs every 6 (six) hours as needed for wheezing.   3 Inhaler   3     Dispense as written.    Triage Vtials: BP 133/86  Pulse 103  Temp(Src) 98.1 F (36.7 C) (Oral)  Resp 20  Ht 5\' 5"  (1.651 m)  Wt 186 lb (84.369 kg)  BMI 30.95  kg/m2  SpO2 95%  Physical Exam  Nursing note and vitals reviewed. Constitutional: He is oriented to person, place, and time. He appears well-developed and well-nourished. No distress.  HENT:  Mouth/Throat: Oropharynx is clear and moist.  Eyes: Pupils are equal, round, and reactive to light.  Neck: Normal range of motion.  Cardiovascular: Regular rhythm.  Tachycardia present.   Pulmonary/Chest: Effort normal. No respiratory distress. He has wheezes. He has no rales.  Musculoskeletal: Normal range of motion.  Neurological: He is alert and oriented to person, place, and time.  Skin: Skin is warm. No rash noted.    ED Course  Procedures (including critical care time)  DIAGNOSTIC STUDIES: Oxygen Saturation is 95% on RA, adequate by my interpretation.    COORDINATION OF CARE: 11:26 AM-Ordered breathing treatment and will f/u with patient. Treatment plan discussed with patient  and patient agrees.  12:18 PM-Patient tolerated duoneb breathing treatment well; however, is not ready for discharge.   1:50 PM-F/U with patient while undergoing duoneb breathing treatment. Patient tolerated treatment well and states feeling much better. Checked lungs and will F/U once treatment is complete.  3:12 PM-F/U with patient following duoneb breathing treatment. Lungs checked; wheezes remain. Patient reports feeling better. Ordered vitals to determine if patient ready to be discharged.  3:19 PM-F/U with patient following concerning vital signs while ambulating. Patient is not fit for discharge and will be admitted to ED.  Labs Review Labs Reviewed  CBC WITH DIFFERENTIAL - Abnormal; Notable for the following:    Neutrophils Relative % 87 (*)    Neutro Abs 7.8 (*)    Lymphocytes Relative 10 (*)    Monocytes Relative 1 (*)    All other components within normal limits  POCT I-STAT, CHEM 8 - Abnormal; Notable for the following:    Creatinine, Ser 2.20 (*)    Glucose, Bld 228 (*)    All other components within normal limits   Imaging Review No results found.  EKG Interpretation   None       MDM   1. Asthma exacerbation   2. Hypoxia     After several neb treatments, including one hour-long admit.  Treatment.  Patient is still wheezing.  He is still having low sats with ambulation.  His decreases to 84%. At this time.  I feel it would be advantageous to admit this patient for asthma, exacerbation, his primary care physician is Dr. Kennon Holter  I personally performed the services described in this documentation, which was scribed in my presence. The recorded information has been reviewed and is accurate.   Garald Balding, NP 01/23/14 [phone]

## 2014-01-23 NOTE — ED Notes (Addendum)
Vitals after ambulation SpO2=84% on room air and pulse rate of 125 bpm; reported to Baker Janus, NP

## 2014-01-23 NOTE — ED Notes (Signed)
Pt was diagnosed with bronchitis last Sunday and gave meds (azithromycin and inhaler) with no relief during week.  Pt returns today with worsening condition from when he was here last Sunday.

## 2014-01-24 ENCOUNTER — Ambulatory Visit: Payer: Medicaid Other | Admitting: Adult Health

## 2014-01-24 DIAGNOSIS — E119 Type 2 diabetes mellitus without complications: Secondary | ICD-10-CM

## 2014-01-24 DIAGNOSIS — R0902 Hypoxemia: Secondary | ICD-10-CM

## 2014-01-24 DIAGNOSIS — R000 Tachycardia, unspecified: Secondary | ICD-10-CM

## 2014-01-24 LAB — EXPECTORATED SPUTUM ASSESSMENT W REFEX TO RESP CULTURE

## 2014-01-24 LAB — RESPIRATORY VIRUS PANEL
Adenovirus: NOT DETECTED
Influenza A H1: NOT DETECTED
Influenza A H3: NOT DETECTED
Influenza A: NOT DETECTED
Influenza B: NOT DETECTED
Metapneumovirus: NOT DETECTED
PARAINFLUENZA 1 A: NOT DETECTED
PARAINFLUENZA 2 A: NOT DETECTED
Parainfluenza 3: NOT DETECTED
RESPIRATORY SYNCYTIAL VIRUS A: NOT DETECTED
RHINOVIRUS: DETECTED — AB
Respiratory Syncytial Virus B: NOT DETECTED

## 2014-01-24 LAB — GLUCOSE, CAPILLARY
Glucose-Capillary: 221 mg/dL — ABNORMAL HIGH (ref 70–99)
Glucose-Capillary: 265 mg/dL — ABNORMAL HIGH (ref 70–99)
Glucose-Capillary: 168 mg/dL — ABNORMAL HIGH (ref 70–99)
Glucose-Capillary: 190 mg/dL — ABNORMAL HIGH (ref 70–99)

## 2014-01-24 LAB — BASIC METABOLIC PANEL
BUN: 26 mg/dL — AB (ref 6–23)
CALCIUM: 9.4 mg/dL (ref 8.4–10.5)
CO2: 20 mEq/L (ref 19–32)
CREATININE: 2.03 mg/dL — AB (ref 0.50–1.35)
Chloride: 96 mEq/L (ref 96–112)
GFR calc non Af Amer: 38 mL/min — ABNORMAL LOW (ref >90)
GFR, EST AFRICAN AMERICAN: 44 mL/min — AB (ref >90)
GLUCOSE: 194 mg/dL — AB (ref 70–99)
Potassium: 4.3 mEq/L (ref 3.7–5.3)
Sodium: 133 mEq/L — ABNORMAL LOW (ref 137–147)

## 2014-01-24 LAB — EXPECTORATED SPUTUM ASSESSMENT W GRAM STAIN, RFLX TO RESP C

## 2014-01-24 LAB — HEMOGLOBIN A1C
Hgb A1c MFr Bld: 7.4 % — ABNORMAL HIGH (ref <5.7)
Mean Plasma Glucose: 166 mg/dL — ABNORMAL HIGH (ref <117)

## 2014-01-24 LAB — HIV ANTIBODY (ROUTINE TESTING W REFLEX): HIV: NONREACTIVE

## 2014-01-24 LAB — LEGIONELLA ANTIGEN, URINE: LEGIONELLA ANTIGEN, URINE: NEGATIVE

## 2014-01-24 LAB — MAGNESIUM: Magnesium: 1.6 mg/dL (ref 1.5–2.5)

## 2014-01-24 LAB — GRAM STAIN

## 2014-01-24 MED ORDER — PANTOPRAZOLE SODIUM 40 MG PO TBEC
40.0000 mg | DELAYED_RELEASE_TABLET | Freq: Every day | ORAL | Status: DC
  Administered 2014-01-25 – 2014-01-26 (×2): 40 mg via ORAL
  Filled 2014-01-24: qty 1

## 2014-01-24 MED ORDER — AMLODIPINE BESYLATE 10 MG PO TABS
10.0000 mg | ORAL_TABLET | Freq: Every day | ORAL | Status: DC
  Administered 2014-01-25 – 2014-01-26 (×2): 10 mg via ORAL
  Filled 2014-01-24 (×2): qty 1

## 2014-01-24 MED ORDER — BUDESONIDE 0.25 MG/2ML IN SUSP
0.2500 mg | Freq: Two times a day (BID) | RESPIRATORY_TRACT | Status: DC
  Administered 2014-01-24 – 2014-01-26 (×4): 0.25 mg via RESPIRATORY_TRACT
  Filled 2014-01-24 (×7): qty 2

## 2014-01-24 MED ORDER — SUCRALFATE 1 GM/10ML PO SUSP
1.0000 g | Freq: Three times a day (TID) | ORAL | Status: DC
  Administered 2014-01-24 – 2014-01-26 (×7): 1 g via ORAL
  Filled 2014-01-24 (×11): qty 10

## 2014-01-24 MED ORDER — CHLORPROMAZINE HCL 10 MG PO TABS
10.0000 mg | ORAL_TABLET | Freq: Once | ORAL | Status: AC
  Administered 2014-01-24: 10 mg via ORAL
  Filled 2014-01-24: qty 1

## 2014-01-24 MED ORDER — VANCOMYCIN HCL IN DEXTROSE 750-5 MG/150ML-% IV SOLN
750.0000 mg | Freq: Two times a day (BID) | INTRAVENOUS | Status: DC
  Administered 2014-01-24: 750 mg via INTRAVENOUS
  Filled 2014-01-24 (×2): qty 150

## 2014-01-24 MED ORDER — ROSUVASTATIN CALCIUM 5 MG PO TABS
2.5000 mg | ORAL_TABLET | Freq: Every day | ORAL | Status: DC
  Administered 2014-01-24 – 2014-01-25 (×2): 2.5 mg via ORAL
  Filled 2014-01-24 (×4): qty 0.5

## 2014-01-24 NOTE — Progress Notes (Signed)
Inpatient Diabetes Program Recommendations  AACE/ADA: New Consensus Statement on Inpatient Glycemic Control (2013)  Target Ranges:  Prepandial:   less than 140 mg/dL      Peak postprandial:   less than 180 mg/dL (1-2 hours)      Critically ill patients:  140 - 180 mg/dL   Reason for Assessment: Hyperglycemia-- on steroids  Outpatient Diabetes medications: Metformin 1000 mg BID Current orders for Inpatient glycemic control: Novolog sensitive correction tid  Results for Walter Harmon, Walter Harmon (MRN [id number]) as of 01/24/2014 13:07  Ref. Range 01/23/2014 17:39 01/23/2014 23:32 01/24/2014 05:54 01/24/2014 10:58  Glucose-Capillary Latest Range: 70-99 mg/dL 227 (H) 190 (H) 221 (H) 265 (H)   Note:  Request MD consider the following:  Add basal insulin-- Perhaps either Levemir 10 units BID or NPH 10 units BID  Add meal coverage Novolog-- Novolog 4 units tid with meals in addition to correction (unless eats <50%, CBG < 80 mg/dl)  Thank you.  Jahshua Bonito S. Marcelline Mates, RN, CNS, CDE Inpatient Diabetes Program, team pager [phone]

## 2014-01-24 NOTE — Care Management Note (Addendum)
  Page 1 of 1   01/26/2014     5:18:40 PM   CARE MANAGEMENT NOTE 01/26/2014  Patient:  ARSENIO, PAO A   Account Number:  [id number]  Date Initiated:  01/24/2014  Documentation initiated by:  Sutton Hirsch  Subjective/Objective Assessment:   Admitted with asthma, and r/o flu     Action/Plan:   CM will follow for dispositon needs   Anticipated DC Date:  01/26/2014   Anticipated DC Plan:  HOME/SELF CARE         Choice offered to / List presented to:             Status of service:  Completed, signed off Medicare Important Message given?   (If response is "NO", the following Medicare IM given date fields will be blank) Date Medicare IM given:   Date Additional Medicare IM given:    Discharge Disposition:  HOME/SELF CARE  Per UR Regulation:  Reviewed for med. necessity/level of care/duration of stay  If discussed at [place] of Stay Meetings, dates discussed:    Comments:  01/26/2014 D/c to home [place], BSN, [place], [place] [phone] 01/26/2014  IV ABX - Blood Culture pending Hamzeh Tall RN, BSN, [place], CCM [phone] 01/24/2014

## 2014-01-24 NOTE — ED Provider Notes (Signed)
Medical screening examination/treatment/procedure(s) were conducted as a shared visit with non-physician practitioner(s) or resident and myself. I personally evaluated the patient during the encounter and agree with the findings and plan unless otherwise indicated.  I have personally reviewed any xrays and/ or EKG's with the provider and I agree with interpretation.  Cough, wheezing worsening despite albuterol and azithromycin. Pt had neb and continuous neb in ED, steroids.  Minimal improvement. Pt O2 80's with ambulation. Exam exp wheeze bilateral, no retractions, mild tachycardia, mmm. Plan for labs, CXR and observation.  Acute asthma exacerbation, dyspnea, cough   Mariea Clonts, MD 01/24/14 2003

## 2014-01-24 NOTE — Progress Notes (Signed)
[facility] PROGRESS NOTE  Walter Harmon UUV:[id number] DOB: 1966/03/05 DOA: 01/23/2014 PCP: Elizabeth Palau, MD  Assessment/Plan: Acute hypoxic respiratory failure secondary to acute asthma exacerbation  - tachycardia due to albuterol - will continue on Solumedrol, BD's scheduled and as needed, empiric ABX  - follow upon sputum analysis  -start pulmicort  HTN  - stop lisinopril due to acute renal failure  - will place on Hydralazine for now  - will add amlodipine for better control   Diabetes mellitus  - A1C 7.4 - hold metformin due to ARF  - place on SSI in the meantime  -expecting elevated CBG's with steroids  HLD  - continue statins -LDL 129 (not a goal) -patient advised to follow low fat diet  Acute on chronic renal failure  - will stop Lisinopril  - will also stop Metformin as it is contraindicated in ARF with Cr > 1.2  - continue IVF's -continue cyclosporine -follow BMET  GERD -Will start patient on protonix and carafate   Code Status: full  Family Communication: no family at bedside Disposition Plan: home when medically stable   Consultants:  None   Procedures:  See below for x-ray reports   Antibiotics:  levaquin   HPI/Subjective: Reports burning epigastric pain and reflux. Patient endorses is breathing better. No fever.  Objective: Filed Vitals:   01/24/14 2110  BP: 169/111  Pulse: 102  Temp: 98.1 F (36.7 C)  Resp:     Intake/Output Summary (Last 24 hours) at 01/24/14 2147 Last data filed at 01/24/14 1749  Gross per 24 hour  Intake   1305 ml  Output   1925 ml  Net   -620 ml   Filed Weights   01/23/14 1113 01/23/14 1903 01/24/14 0551  Weight: 84.369 kg (186 lb) 81.3 kg (179 lb 3.7 oz) 81.874 kg (180 lb 8 oz)    Exam:   General:  Feeling better, no fever, less coughing and wheezing  Cardiovascular: s1 and s2, no rubs or gallops  Respiratory: positive exp wheezing, diffuse rhonchi  Abdomen: soft, no  distension, positive BS; patient reports epigastric burning pain  Musculoskeletal: no joint swelling, no LE ede and no cyanosis   Data Reviewed: Basic Metabolic Panel:  Recent Labs Lab 01/23/14 0051 01/23/14 1554 01/24/14 0051  NA  --  140 133*  K  --  4.1 4.3  CL  --  103 96  CO2  --   --  20  GLUCOSE  --  228* 194*  BUN  --  21 26*  CREATININE  --  2.20* 2.03*  CALCIUM  --   --  9.4  MG 1.6  --   --    CBC:  Recent Labs Lab 01/23/14 1540 01/23/14 1554  WBC 9.0  --   NEUTROABS 7.8*  --   HGB 14.8 16.0  HCT 42.7 47.0  MCV 91.2  --   PLT 228  --    CBG:  Recent Labs Lab 01/23/14 2332 01/24/14 0554 01/24/14 1058 01/24/14 1613 01/24/14 2134  GLUCAP 190* 221* 265* 168* 190*    Recent Results (from the past 240 hour(s))  CULTURE, BLOOD (ROUTINE X 2)     Status: None   Collection Time    01/23/14  6:03 PM      Result Value Range Status   Specimen Description BLOOD LEFT HAND   Final   Special Requests BOTTLES DRAWN AEROBIC ONLY [facility]   Final   Culture  Setup Time  Final   Value: 01/23/2014 22:42     Performed at [facility]   Culture     Final   Value:        BLOOD CULTURE RECEIVED NO GROWTH TO DATE CULTURE WILL BE HELD FOR 5 DAYS BEFORE ISSUING A FINAL NEGATIVE REPORT     Performed at [facility]   Report Status PENDING   Incomplete  CULTURE, BLOOD (ROUTINE X 2)     Status: None   Collection Time    01/23/14  7:40 PM      Result Value Range Status   Specimen Description BLOOD LEFT HAND   Final   Special Requests BOTTLES DRAWN AEROBIC ONLY 10CC   Final   Culture  Setup Time     Final   Value: 01/23/2014 22:42     Performed at [facility]   Culture     Final   Value:        BLOOD CULTURE RECEIVED NO GROWTH TO DATE CULTURE WILL BE HELD FOR 5 DAYS BEFORE ISSUING A FINAL NEGATIVE REPORT     Performed at [facility]   Report Status PENDING   Incomplete  RESPIRATORY VIRUS PANEL     Status: Abnormal   Collection Time     01/23/14  8:31 PM      Result Value Range Status   Source - RVPAN NASAL SWAB   Corrected   Comment: CORRECTED ON 02/02 AT 1933: PREVIOUSLY REPORTED AS NASAL SWAB   Respiratory Syncytial Virus A NOT DETECTED   Final   Respiratory Syncytial Virus B NOT DETECTED   Final   Influenza A NOT DETECTED   Final   Influenza B NOT DETECTED   Final   Parainfluenza 1 NOT DETECTED   Final   Parainfluenza 2 NOT DETECTED   Final   Parainfluenza 3 NOT DETECTED   Final   Metapneumovirus NOT DETECTED   Final   Rhinovirus DETECTED (*)  Final   Adenovirus NOT DETECTED   Final   Influenza A H1 NOT DETECTED   Final   Influenza A H3 NOT DETECTED   Final   Comment: (NOTE)           Normal Reference Range for each Analyte: NOT DETECTED     Testing performed using the Luminex xTAG Respiratory Viral Panel test     kit.     This test was developed and its performance characteristics determined     by [facility]. It has not been cleared or approved by the [place]     Food and Drug Administration. This test is used for clinical purposes.     It should not be regarded as investigational or for research. This     laboratory is certified under the [place] (CLIA) as qualified to perform high complexity     clinical laboratory testing.     Performed at [place], EXPECTORATED SPUTUM-ASSESSMENT     Status: None   Collection Time    01/24/14  1:08 AM      Result Value Range Status   Specimen Description SPUTUM   Final   Special Requests NONE   Final   Sputum evaluation     Final   Value: THIS SPECIMEN IS ACCEPTABLE. RESPIRATORY CULTURE REPORT TO FOLLOW.   Report Status 01/24/2014 FINAL   Final  GRAM STAIN     Status: None   Collection  Time    01/24/14  1:08 AM      Result Value Range Status   Specimen Description SPUTUM   Final   Special Requests NONE   Final   Gram Stain     Final   Value: FEW WBC PRESENT,BOTH PMN AND MONONUCLEAR     FEW  GRAM POSITIVE COCCI IN CHAINS IN PAIRS     RARE GRAM NEGATIVE RODS     Gram Stain Report Called to,Read Back By and Verified With: J FUTRELL,RN 01/24/14 0231 [facility] M   Report Status 01/24/2014 FINAL   Final  CULTURE, RESPIRATORY (NON-EXPECTORATED)     Status: None   Collection Time    01/24/14  1:08 AM      Result Value Range Status   Specimen Description SPUTUM   Final   Special Requests NONE   Final   Gram Stain     Final   Value: FEW WBC PRESENT,BOTH PMN AND MONONUCLEAR     FEW GRAM POSITIVE COCCI IN PAIRS AND CHAINS     RARE GRAM NEGATIVE RODS     Gram Stain Report Called to,Read Back By and Verified With: Gram Stain Report Called to,Read Back By and Verified With:  J FUTRELL RN2/2/15 0231 [facility] N Performed at [facility]     Performed at [facility]   Culture PENDING   Incomplete   Report Status PENDING   Incomplete     Studies: No results found.  Scheduled Meds: . albuterol  2.5 mg Nebulization QID  . budesonide (PULMICORT) nebulizer solution  0.25 mg Nebulization BID  . cycloSPORINE modified  150 mg Oral q morning - 10a  . enoxaparin (LOVENOX) injection  40 mg Subcutaneous Q24H  . fluticasone  1 spray Each Nare Daily  . hydrALAZINE  10 mg Oral Q8H  . insulin aspart  0-9 Units Subcutaneous TID WC  . levofloxacin  750 mg Oral Q48H  . methylPREDNISolone (SOLU-MEDROL) injection  40 mg Intravenous Q6H  . [START ON 01/25/2014] pantoprazole  40 mg Oral Q1200  . rosuvastatin  2.5 mg Oral q1800  . sucralfate  1 g Oral TID WC & HS   Continuous Infusions: . sodium chloride 75 mL/hr at 01/23/14 1730    Time spent: >30 minutes    Geryl Dohn  [facility] Pager [phone]. If 7PM-7AM, please contact night-coverage at [email or web address], password [facility] 01/24/2014, 9:47 PM  LOS: 1 day

## 2014-01-24 NOTE — Progress Notes (Signed)
ANTIBIOTIC CONSULT NOTE - INITIAL  Pharmacy Consult for vancomycin Indication: rule out pneumonia  No Known Allergies  Patient Measurements: Height: 5\' 5"  (165.1 cm) Weight: 179 lb 3.7 oz (81.3 kg) IBW/kg (Calculated) : 61.5  Vital Signs: Temp: 97.9 F (36.6 C) (02/01 1903) Temp src: Oral (02/01 1903) BP: 128/84 mmHg (02/01 1903) Pulse Rate: 125 (02/01 1903)  Labs:  Recent Labs  01/23/14 1540 01/23/14 1554 01/23/14 1853 01/24/14 0051  WBC 9.0  --   --   --   HGB 14.8 16.0  --   --   PLT 228  --   --   --   LABCREA  --   --  212.82  --   CREATININE  --  2.20*  --  2.03*   Estimated Creatinine Clearance: 44.6 ml/min (by C-G formula based on Cr of 2.03).   Microbiology: Recent Results (from the past 720 hour(s))  CULTURE, EXPECTORATED SPUTUM-ASSESSMENT     Status: None   Collection Time    01/24/14  1:08 AM      Result Value Range Status   Specimen Description SPUTUM   Final   Special Requests NONE   Final   Sputum evaluation     Final   Value: THIS SPECIMEN IS ACCEPTABLE. RESPIRATORY CULTURE REPORT TO FOLLOW.   Report Status 01/24/2014 FINAL   Final  GRAM STAIN     Status: None   Collection Time    01/24/14  1:08 AM      Result Value Range Status   Specimen Description SPUTUM   Final   Special Requests NONE   Final   Gram Stain     Final   Value: FEW WBC PRESENT,BOTH PMN AND MONONUCLEAR     FEW GRAM POSITIVE COCCI IN CHAINS IN PAIRS     RARE GRAM NEGATIVE RODS     Gram Stain Report Called to,Read Back By and Verified With: J FUTRELL,RN 01/24/14 0231 [facility] M   Report Status 01/24/2014 FINAL   Final    Medical History: Past Medical History  Diagnosis Date  . Asthma   . Diabetes mellitus   . Hypertension     Medications:  Prescriptions prior to admission  Medication Sig Dispense Refill  . budesonide-formoterol (SYMBICORT) 160-4.5 MCG/ACT inhaler Inhale 2 puffs into the lungs 2 (two) times daily.  3 Inhaler  3  . cycloSPORINE modified (NEORAL) 50 MG  capsule Take 150 mg by mouth every morning. Prescribed for 150mg  twice daily. Patient takes 150mg  once daily in the morning. The evening dose causes his reflux to act up.      Marland Kitchen lisinopril (PRINIVIL,ZESTRIL) 20 MG tablet Take 20 mg by mouth daily.      . metFORMIN (GLUCOPHAGE) 1000 MG tablet Take 1,000 mg by mouth daily.       . mometasone (NASONEX) 50 MCG/ACT nasal spray Place 2 sprays into the nose daily.      . pravastatin (PRAVACHOL) 20 MG tablet Take 20 mg by mouth daily.      Marland Kitchen PROAIR HFA 108 (90 BASE) MCG/ACT inhaler Inhale 2 puffs into the lungs every 6 (six) hours as needed for wheezing.  3 Inhaler  3   Scheduled:  . albuterol  2.5 mg Nebulization QID  . budesonide-formoterol  2 puff Inhalation BID  . chlorproMAZINE  10 mg Oral Once  . cycloSPORINE modified  150 mg Oral q morning - 10a  . enoxaparin (LOVENOX) injection  40 mg Subcutaneous Q24H  . fluticasone  1  spray Each Nare Daily  . hydrALAZINE  10 mg Oral Q8H  . insulin aspart  0-9 Units Subcutaneous TID WC  . levofloxacin  750 mg Oral Q48H  . methylPREDNISolone (SOLU-MEDROL) injection  40 mg Intravenous Q6H  . pneumococcal 23 valent vaccine  0.5 mL Intramuscular Tomorrow-1000  . simvastatin  10 mg Oral q1800  . vancomycin  750 mg Intravenous Q12H   Infusions:  . sodium chloride 75 mL/hr at 01/23/14 1730    Assessment: 48yo male c/o progressively worsening SOB associated w/ fevers, chills, cough, wheezing, and malaise, presumed asthma exacerbation, now w/ sputum gram stain revealing GPC in chains/pairs, to broaden ABX.  Goal of Therapy:  Vancomycin trough level 15-20 mcg/ml  Plan:  Will begin vancomycin 750mg  IV Q12H and monitor CBC, Cx, levels prn.  Wynona Neat, PharmD, BCPS  01/24/2014,2:52 AM

## 2014-01-24 NOTE — Progress Notes (Signed)
Pt complained of having hiccups and indigestion, Walter C., NP notified, new orders given and followed through. Patient stated he had no relief in 1-2 hours and Walter C., NP notified and new order given and completed. Patient stated he continued to have hiccups which caused his heartburn and he asked if he could have something else. Walter C. Notified and new orders given, will continue to monitor. Lab called with results of sputum gram stain, see flow sheet, Walter notified and no new orders given at this time. Patient alert and oriented times 4, ambulating in room independently, able to communicate needs.

## 2014-01-25 LAB — GLUCOSE, CAPILLARY
GLUCOSE-CAPILLARY: 186 mg/dL — AB (ref 70–99)
Glucose-Capillary: 166 mg/dL — ABNORMAL HIGH (ref 70–99)
Glucose-Capillary: 193 mg/dL — ABNORMAL HIGH (ref 70–99)
Glucose-Capillary: 251 mg/dL — ABNORMAL HIGH (ref 70–99)

## 2014-01-25 LAB — BASIC METABOLIC PANEL
BUN: 31 mg/dL — AB (ref 6–23)
CALCIUM: 8.9 mg/dL (ref 8.4–10.5)
CO2: 20 meq/L (ref 19–32)
CREATININE: 1.95 mg/dL — AB (ref 0.50–1.35)
Chloride: 99 mEq/L (ref 96–112)
GFR calc Af Amer: 46 mL/min — ABNORMAL LOW (ref >90)
GFR calc non Af Amer: 39 mL/min — ABNORMAL LOW (ref >90)
GLUCOSE: 264 mg/dL — AB (ref 70–99)
Potassium: 4.8 mEq/L (ref 3.7–5.3)
Sodium: 133 mEq/L — ABNORMAL LOW (ref 137–147)

## 2014-01-25 MED ORDER — GI COCKTAIL ~~LOC~~
30.0000 mL | Freq: Once | ORAL | Status: AC
  Administered 2014-01-25: 30 mL via ORAL
  Filled 2014-01-25: qty 30

## 2014-01-25 MED ORDER — METOCLOPRAMIDE HCL 5 MG/ML IJ SOLN
10.0000 mg | Freq: Once | INTRAMUSCULAR | Status: AC
  Administered 2014-01-25: 10 mg via INTRAVENOUS
  Filled 2014-01-25: qty 2

## 2014-01-25 MED ORDER — CYCLOSPORINE MODIFIED (NEORAL) 25 MG PO CAPS
150.0000 mg | ORAL_CAPSULE | Freq: Two times a day (BID) | ORAL | Status: DC
  Administered 2014-01-25 – 2014-01-26 (×2): 150 mg via ORAL
  Filled 2014-01-25 (×3): qty 2

## 2014-01-25 MED ORDER — CHLORPROMAZINE HCL 25 MG PO TABS
25.0000 mg | ORAL_TABLET | Freq: Once | ORAL | Status: AC
  Administered 2014-01-25: 25 mg via ORAL
  Filled 2014-01-25: qty 1

## 2014-01-25 MED ORDER — METHYLPREDNISOLONE SODIUM SUCC 40 MG IJ SOLR
40.0000 mg | Freq: Two times a day (BID) | INTRAMUSCULAR | Status: DC
  Administered 2014-01-26: 40 mg via INTRAVENOUS
  Filled 2014-01-25 (×3): qty 1

## 2014-01-25 MED ORDER — GI COCKTAIL ~~LOC~~
30.0000 mL | Freq: Three times a day (TID) | ORAL | Status: DC | PRN
  Administered 2014-01-25: 30 mL via ORAL
  Filled 2014-01-25: qty 30

## 2014-01-25 MED ORDER — LORATADINE 10 MG PO TABS
10.0000 mg | ORAL_TABLET | Freq: Every day | ORAL | Status: DC
  Administered 2014-01-25 – 2014-01-26 (×2): 10 mg via ORAL
  Filled 2014-01-25 (×2): qty 1

## 2014-01-25 NOTE — Progress Notes (Signed)
[facility] PROGRESS NOTE  Walter Harmon BCW:[id number] DOB: 12-29-1965 DOA: 01/23/2014 PCP: Elizabeth Palau, MD  Assessment/Plan: Acute hypoxic respiratory failure secondary to acute asthma exacerbation  - tachycardia due to albuterol; will d/c telemetry -still wheezing and with diffuse rhonchi; no chills or fever; doing better - will continue on Solumedrol (start tapering), BD's scheduled and as needed, empiric ABX  - follow upon sputum analysis  -continue pulmicort -patient positive for rhinovirus -will continue loratadine daily -hopefully home in am  HTN  -will continue holding lisinopril due to acute renal failure  - will place on Hydralazine and amlodipine for now  - BP stable; continue adjusting meds as needed  Diabetes mellitus  - A1C 7.4 - hold metformin due to ARF  - place on SSI in the meantime  -expecting elevated CBG's with steroids -at discharge consider amaryl instead of metformin due to RF  HLD  -continue statins (change to crestor due to use of cyclosporine) -LDL 129 (not a goal) -patient advised to follow low fat diet  Acute on chronic renal failure with diagnosis of Focal sclerosing glomerulonephritis  - improving adn on 2/3 Cr 1.95 -will change IVF's to NSL -patient encourage to keep himself hydrated by mouth -continue holding lisinopril -will also stop Metformin as it is contraindicated in ARF with Cr > 1.2  -continue cyclosporine and follow up with [name] in outpatient setting -follow BMET in am  GERD -slightly better; will continue on protonix and carafate   Code Status: full  Family Communication: no family at bedside Disposition Plan: home when medically stable (most likely in am)   Consultants:  None   Procedures:  See below for x-ray reports   Antibiotics:  levaquin   HPI/Subjective: Reports burning epigastric pain and reflux better but still present; breathing continue improving. Still with wheezing and some  coughing spells. No fever.  Objective: Filed Vitals:   01/25/14 0500  BP: 131/84  Pulse: 96  Temp: 97.9 F (36.6 C)  Resp: 18    Intake/Output Summary (Last 24 hours) at 01/25/14 1145 Last data filed at 01/25/14 0945  Gross per 24 hour  Intake   1590 ml  Output   2600 ml  Net  -1010 ml   Filed Weights   01/23/14 1903 01/24/14 0551 01/25/14 0500  Weight: 81.3 kg (179 lb 3.7 oz) 81.874 kg (180 lb 8 oz) 82.9 kg (182 lb 12.2 oz)    Exam:   General:  Feeling better, no fever, still with some coughing spells and positive wheezing  Cardiovascular: s1 and s2, no rubs or gallops  Respiratory: positive exp wheezing, scattered rhonchi  Abdomen: soft, no distension, positive BS; patient reports epigastric burning pain  Musculoskeletal: no joint swelling, no LE ede and no cyanosis   Data Reviewed: Basic Metabolic Panel:  Recent Labs Lab 01/23/14 0051 01/23/14 1554 01/24/14 0051 01/25/14 0850  NA  --  140 133* 133*  K  --  4.1 4.3 4.8  CL  --  103 96 99  CO2  --   --  20 20  GLUCOSE  --  228* 194* 264*  BUN  --  21 26* 31*  CREATININE  --  2.20* 2.03* 1.95*  CALCIUM  --   --  9.4 8.9  MG 1.6  --   --   --    CBC:  Recent Labs Lab 01/23/14 1540 01/23/14 1554  WBC 9.0  --   NEUTROABS 7.8*  --   HGB 14.8 16.0  HCT 42.7 47.0  MCV 91.2  --   PLT 228  --    CBG:  Recent Labs Lab 01/24/14 0554 01/24/14 1058 01/24/14 1613 01/24/14 2134 01/25/14 0646  GLUCAP 221* 265* 168* 190* 166*    Recent Results (from the past 240 hour(s))  CULTURE, BLOOD (ROUTINE X 2)     Status: None   Collection Time    01/23/14  6:03 PM      Result Value Range Status   Specimen Description BLOOD LEFT HAND   Final   Special Requests BOTTLES DRAWN AEROBIC ONLY 6CC   Final   Culture  Setup Time     Final   Value: 01/23/2014 22:42     Performed at [facility]   Culture     Final   Value:        BLOOD CULTURE RECEIVED NO GROWTH TO DATE CULTURE WILL BE HELD FOR 5 DAYS  BEFORE ISSUING A FINAL NEGATIVE REPORT     Performed at [facility]   Report Status PENDING   Incomplete  CULTURE, BLOOD (ROUTINE X 2)     Status: None   Collection Time    01/23/14  7:40 PM      Result Value Range Status   Specimen Description BLOOD LEFT HAND   Final   Special Requests BOTTLES DRAWN AEROBIC ONLY 10CC   Final   Culture  Setup Time     Final   Value: 01/23/2014 22:42     Performed at [facility]   Culture     Final   Value:        BLOOD CULTURE RECEIVED NO GROWTH TO DATE CULTURE WILL BE HELD FOR 5 DAYS BEFORE ISSUING A FINAL NEGATIVE REPORT     Performed at [facility]   Report Status PENDING   Incomplete  RESPIRATORY VIRUS PANEL     Status: Abnormal   Collection Time    01/23/14  8:31 PM      Result Value Range Status   Source - RVPAN NASAL SWAB   Corrected   Comment: CORRECTED ON 02/02 AT 1933: PREVIOUSLY REPORTED AS NASAL SWAB   Respiratory Syncytial Virus A NOT DETECTED   Final   Respiratory Syncytial Virus B NOT DETECTED   Final   Influenza A NOT DETECTED   Final   Influenza B NOT DETECTED   Final   Parainfluenza 1 NOT DETECTED   Final   Parainfluenza 2 NOT DETECTED   Final   Parainfluenza 3 NOT DETECTED   Final   Metapneumovirus NOT DETECTED   Final   Rhinovirus DETECTED (*)  Final   Adenovirus NOT DETECTED   Final   Influenza A H1 NOT DETECTED   Final   Influenza A H3 NOT DETECTED   Final   Comment: (NOTE)           Normal Reference Range for each Analyte: NOT DETECTED     Testing performed using the Luminex xTAG Respiratory Viral Panel test     kit.     This test was developed and its performance characteristics determined     by [facility]. It has not been cleared or approved by the [place]     Food and Drug Administration. This test is used for clinical purposes.     It should not be regarded as investigational or for research. This     laboratory is certified under the Clinical Laboratory Improvement     Amendments  of 1988 (CLIA) as qualified to perform high complexity     clinical laboratory testing.     Performed at [place], EXPECTORATED SPUTUM-ASSESSMENT     Status: None   Collection Time    01/24/14  1:08 AM      Result Value Range Status   Specimen Description SPUTUM   Final   Special Requests NONE   Final   Sputum evaluation     Final   Value: THIS SPECIMEN IS ACCEPTABLE. RESPIRATORY CULTURE REPORT TO FOLLOW.   Report Status 01/24/2014 FINAL   Final  GRAM STAIN     Status: None   Collection Time    01/24/14  1:08 AM      Result Value Range Status   Specimen Description SPUTUM   Final   Special Requests NONE   Final   Gram Stain     Final   Value: FEW WBC PRESENT,BOTH PMN AND MONONUCLEAR     FEW GRAM POSITIVE COCCI IN CHAINS IN PAIRS     RARE GRAM NEGATIVE RODS     Gram Stain Report Called to,Read Back By and Verified With: J FUTRELL,RN 01/24/14 0231 [facility] M   Report Status 01/24/2014 FINAL   Final  CULTURE, RESPIRATORY (NON-EXPECTORATED)     Status: None   Collection Time    01/24/14  1:08 AM      Result Value Range Status   Specimen Description SPUTUM   Final   Special Requests NONE   Final   Gram Stain     Final   Value: FEW WBC PRESENT,BOTH PMN AND MONONUCLEAR     FEW GRAM POSITIVE COCCI IN PAIRS AND CHAINS     RARE GRAM NEGATIVE RODS     Gram Stain Report Called to,Read Back By and Verified With: Gram Stain Report Called to,Read Back By and Verified With:  J FUTRELL RN2/2/15 0231 [facility] N Performed at [facility]     Performed at [facility]   Culture     Final   Value: Culture reincubated for better growth     Performed at [facility]   Report Status PENDING   Incomplete     Studies: No results found.  Scheduled Meds: . albuterol  2.5 mg Nebulization QID  . amLODipine  10 mg Oral Daily  . budesonide (PULMICORT) nebulizer solution  0.25 mg Nebulization BID  . cycloSPORINE modified  150 mg Oral BID  . enoxaparin  (LOVENOX) injection  40 mg Subcutaneous Q24H  . fluticasone  1 spray Each Nare Daily  . hydrALAZINE  10 mg Oral Q8H  . insulin aspart  0-9 Units Subcutaneous TID WC  . levofloxacin  750 mg Oral Q48H  . methylPREDNISolone (SOLU-MEDROL) injection  40 mg Intravenous Q12H  . pantoprazole  40 mg Oral Q1200  . rosuvastatin  2.5 mg Oral q1800  . sucralfate  1 g Oral TID WC & HS   Continuous Infusions:    Time spent: < 30 minutes    Yenni Carra  [facility] Pager [phone]. If 7PM-7AM, please contact night-coverage at [email or web address], password [facility] 01/25/2014, 11:45 AM  LOS: 2 days

## 2014-01-25 NOTE — Progress Notes (Addendum)
01/24/14 1900-0700. Pt.is A/Ox4, he ambulates independently. He had no c/o pain and no signs of distress. He did c/o heartburn and acid reflux so Mid-level provider on call with [facility] was paged and notified. New order was written. This am patient disconnected his own IV tubing line from his IV and turned off his IV pump without my knowledge. Pt.said that he believes the continuous IV fluid he was receiving was causing his heartburn and acid reflux to be worse.I explained to pt.the importance of not disconnecting any line, pump, or etc. I also explained to him that if he felt that something was causing him discomfort or wanted to something changed he should notify me first. I explained to him to the risk for infection from disconnecting IV lines. Pt.verbalized that he understood. Pt.IV tubing was changed. Told pt. I would leave a note on his chart this am for the MD to address it. Physician sticky note left on chart for MD to address.

## 2014-01-26 DIAGNOSIS — J45901 Unspecified asthma with (acute) exacerbation: Secondary | ICD-10-CM | POA: Diagnosis present

## 2014-01-26 LAB — BASIC METABOLIC PANEL
BUN: 33 mg/dL — ABNORMAL HIGH (ref 6–23)
CO2: 20 meq/L (ref 19–32)
CREATININE: 2.15 mg/dL — AB (ref 0.50–1.35)
Calcium: 8.8 mg/dL (ref 8.4–10.5)
Chloride: 100 mEq/L (ref 96–112)
GFR calc Af Amer: 41 mL/min — ABNORMAL LOW (ref >90)
GFR calc non Af Amer: 35 mL/min — ABNORMAL LOW (ref >90)
GLUCOSE: 169 mg/dL — AB (ref 70–99)
Potassium: 4.9 mEq/L (ref 3.7–5.3)
Sodium: 135 mEq/L — ABNORMAL LOW (ref 137–147)

## 2014-01-26 LAB — GLUCOSE, CAPILLARY: Glucose-Capillary: 232 mg/dL — ABNORMAL HIGH (ref 70–99)

## 2014-01-26 LAB — CULTURE, RESPIRATORY W GRAM STAIN: Culture: NORMAL

## 2014-01-26 LAB — CULTURE, RESPIRATORY

## 2014-01-26 MED ORDER — PREDNISONE 10 MG PO TABS: ORAL_TABLET | ORAL | Status: DC

## 2014-01-26 MED ORDER — LEVOFLOXACIN 750 MG PO TABS: 750.0000 mg | ORAL_TABLET | ORAL | Status: DC

## 2014-01-26 MED ORDER — INSULIN GLARGINE 100 UNIT/ML ~~LOC~~ SOLN: 15.0000 [IU] | Freq: Every day | SUBCUTANEOUS | Status: DC

## 2014-01-26 MED ORDER — INSULIN GLARGINE 100 UNIT/ML ~~LOC~~ SOLN
15.0000 [IU] | Freq: Every day | SUBCUTANEOUS | Status: DC
  Filled 2014-01-26: qty 0.15

## 2014-01-26 MED ORDER — GLIPIZIDE 5 MG PO TABS: 2.5000 mg | ORAL_TABLET | Freq: Every day | ORAL | Status: DC

## 2014-01-26 NOTE — Discharge Summary (Signed)
Physician Discharge Summary  Walter Harmon RJJ:[id number] DOB: August 01, 1966 DOA: 01/23/2014  PCP: Walter Palau, MD  Admit date: 01/23/2014 Discharge date: 01/26/2014  Time spent: 35 minutes  Recommendations for Outpatient Follow-up:  1. Follow up with PCP in 2 -4 weeks.  Discharge Diagnoses:  Principal Problem:   Asthma with acute exacerbation Active Problems:   Diabetes type 2, controlled   CKD (chronic kidney disease), stage III   Asthma   Discharge Condition: stable  Diet recommendation: Carb modified diet  Filed Weights   01/24/14 0551 01/25/14 0500 01/26/14 0636  Weight: 81.874 kg (180 lb 8 oz) 82.9 kg (182 lb 12.2 oz) 80.922 kg (178 lb 6.4 oz)    History of present illness:  48 y.o. Male with HTN, HLD, diabetes, asthma who presents to [facility] ED with main concern of several days duration of progressively worsening shortness of breath, worse with exertion and with no specific alleviating factors including use of albuterol inhaler at home. Pt also reports associated subjective fevers, chills, cough productive of yellow sputum, wheezing, malaise. Pt explains he was in ED several days prior to this admission for the same concern and was given Zithromax with no significant improvement in his symptoms. Pt denies known sick contacts of exposures, he sees Walter Harmon with PCCM.    Hospital Course:  Acute hypoxic respiratory failure secondary to acute asthma exacerbation  - Initially started on IV steroids and antibiotics. - Once he was ambulating with out any difficulties steroids switch to oral. - Virus panel + Rhinovirus. - Continue pulmicort and albuterol PRN - Patient positive for rhinovirus  - Cont steroids and anitbiotics oral. - HTN  - Will continue holding lisinopril due to acute renal failure  - Will place on Hydralazine and amlodipine for now  - BP stable; continue adjusting meds as needed   Diabetes mellitus  - HbgA1C 7.4  - hold metformin due to ARF  -  Started on low dose glipizide.  HLD  - Continue statins (change to crestor due to use of cyclosporine)  - LDL 129 (not a goal)  - Patient advised to follow low fat diet   Acute on chronic renal failure with diagnosis of Focal sclerosing glomerulonephritis  - Improving adn on 2/3 Cr 1.95, baseline arounf 1.9- 2.2. - Patient encourage to keep himself hydrated by mouth  - will also stop Metformin as it is contraindicated in ARF with Cr > 1.2  - Continue cyclosporine and follow up with Dr. Florene Harmon in outpatient setting  - follow BMET in am   Procedures:  CXR  Consultations:  none  Discharge Exam: Filed Vitals:   01/26/14 0957  BP: 120/84  Pulse:   Temp:   Resp:     General: A&O x3 Cardiovascular: RRR Respiratory: good air movement CTA B/L  Discharge Instructions      Discharge Orders   Future Orders Complete By Expires   Diet - low sodium heart healthy  As directed    Increase activity slowly  As directed        Medication List    STOP taking these medications       metFORMIN 1000 MG tablet  Commonly known as:  GLUCOPHAGE      TAKE these medications       budesonide-formoterol 160-4.5 MCG/ACT inhaler  Commonly known as:  SYMBICORT  Inhale 2 puffs into the lungs 2 (two) times daily.     cycloSPORINE modified 50 MG capsule  Commonly known as:  NEORAL  Take  150 mg by mouth every morning. Prescribed for 135m twice daily. Patient takes 1546monce daily in the morning. The evening dose causes his reflux to act up.     glipiZIDE 5 MG tablet  Commonly known as:  GLUCOTROL  Take 0.5 tablets (2.5 mg total) by mouth daily before breakfast.     levofloxacin 750 MG tablet  Commonly known as:  LEVAQUIN  Take 1 tablet (750 mg total) by mouth every other day.     lisinopril 20 MG tablet  Commonly known as:  PRINIVIL,ZESTRIL  Take 20 mg by mouth daily.     mometasone 50 MCG/ACT nasal spray  Commonly known as:  NASONEX  Place 2 sprays into the nose daily.      pravastatin 20 MG tablet  Commonly known as:  PRAVACHOL  Take 20 mg by mouth daily.     predniSONE 10 MG tablet  Commonly known as:  DELTASONE  Takes 6 tablets for 1 days, then 5 tablets for 1 days, then 4 tablets for 1 days, then 3 tablets for 1 days, then 2 tabs for 1 days, then 1 tab for 1 days, and then stop.     PROAIR HFA 108 (90 BASE) MCG/ACT inhaler  Generic drug:  albuterol  Inhale 2 puffs into the lungs every 6 (six) hours as needed for wheezing.       No Known Allergies Follow-up Information   Follow up with BLElizabeth PalauMD In 1 week. (follow up in 1 week.)    Specialty:  Family Medicine   Contact information:   [address] [phone]      The results of significant diagnostics from this hospitalization (including imaging, microbiology, ancillary and laboratory) are listed below for reference.    Significant Diagnostic Studies: Dg Chest 2 View  01/16/2014   CLINICAL DATA:  Cough, congestion.  EXAM: CHEST  2 VIEW  COMPARISON:  12/29/2012, 12/13/2012  FINDINGS: The heart size and mediastinal contours are within normal limits. Both lungs are clear. The visualized skeletal structures are unremarkable.  IMPRESSION: No active cardiopulmonary disease.   Electronically Signed   By: TrDaryll Brod.D.   On: 01/16/2014 23:42    Microbiology: Recent Results (from the past 240 hour(s))  CULTURE, BLOOD (ROUTINE X 2)     Status: None   Collection Time    01/23/14  6:03 PM      Result Value Range Status   Specimen Description BLOOD LEFT HAND   Final   Special Requests BOTTLES DRAWN AEROBIC ONLY [place] Final   Culture  Setup Time     Final   Value: 01/23/2014 22:42     Performed at [facility] Culture     Final   Value:        BLOOD CULTURE RECEIVED NO GROWTH TO DATE CULTURE WILL BE HELD FOR 5 DAYS BEFORE ISSUING A FINAL NEGATIVE REPORT     Performed at [facility] Report Status PENDING   Incomplete   CULTURE, BLOOD (ROUTINE X 2)     Status: None   Collection Time    01/23/14  7:40 PM      Result Value Range Status   Specimen Description BLOOD LEFT HAND   Final   Special Requests BOTTLES DRAWN AEROBIC ONLY 10CC   Final   Culture  Setup Time     Final   Value: 01/23/2014 22:42  Performed at [facility]     Final   Value:        BLOOD CULTURE RECEIVED NO GROWTH TO DATE CULTURE WILL BE HELD FOR 5 DAYS BEFORE ISSUING A FINAL NEGATIVE REPORT     Performed at [facility]   Report Status PENDING   Incomplete  RESPIRATORY VIRUS PANEL     Status: Abnormal   Collection Time    01/23/14  8:31 PM      Result Value Range Status   Source - RVPAN NASAL SWAB   Corrected   Comment: CORRECTED ON 02/02 AT 1933: PREVIOUSLY REPORTED AS NASAL SWAB   Respiratory Syncytial Virus A NOT DETECTED   Final   Respiratory Syncytial Virus B NOT DETECTED   Final   Influenza A NOT DETECTED   Final   Influenza B NOT DETECTED   Final   Parainfluenza 1 NOT DETECTED   Final   Parainfluenza 2 NOT DETECTED   Final   Parainfluenza 3 NOT DETECTED   Final   Metapneumovirus NOT DETECTED   Final   Rhinovirus DETECTED (*)  Final   Adenovirus NOT DETECTED   Final   Influenza A H1 NOT DETECTED   Final   Influenza A H3 NOT DETECTED   Final   Comment: (NOTE)           Normal Reference Range for each Analyte: NOT DETECTED     Testing performed using the Luminex xTAG Respiratory Viral Panel test     kit.     This test was developed and its performance characteristics determined     by [facility]. It has not been cleared or approved by the [place]     Food and Drug Administration. This test is used for clinical purposes.     It should not be regarded as investigational or for research. This     laboratory is certified under the [place] (CLIA) as qualified to perform high complexity     clinical laboratory testing.     Performed at [place], EXPECTORATED SPUTUM-ASSESSMENT     Status: None   Collection Time    01/24/14  1:08 AM      Result Value Range Status   Specimen Description SPUTUM   Final   Special Requests NONE   Final   Sputum evaluation     Final   Value: THIS SPECIMEN IS ACCEPTABLE. RESPIRATORY CULTURE REPORT TO FOLLOW.   Report Status 01/24/2014 FINAL   Final  GRAM STAIN     Status: None   Collection Time    01/24/14  1:08 AM      Result Value Range Status   Specimen Description SPUTUM   Final   Special Requests NONE   Final   Gram Stain     Final   Value: FEW WBC PRESENT,BOTH PMN AND MONONUCLEAR     FEW GRAM POSITIVE COCCI IN CHAINS IN PAIRS     RARE GRAM NEGATIVE RODS     Gram Stain Report Called to,Read Back By and Verified With: J FUTRELL,RN 01/24/14 0231 [facility] M   Report Status 01/24/2014 FINAL   Final  CULTURE, RESPIRATORY (NON-EXPECTORATED)     Status: None   Collection Time    01/24/14  1:08 AM      Result Value Range Status   Specimen Description SPUTUM   Final   Special Requests NONE  Final   Gram Stain     Final   Value: FEW WBC PRESENT,BOTH PMN AND MONONUCLEAR     FEW GRAM POSITIVE COCCI IN PAIRS AND CHAINS     RARE GRAM NEGATIVE RODS     Gram Stain Report Called to,Read Back By and Verified With: Gram Stain Report Called to,Read Back By and Verified With:  J FUTRELL RN2/2/15 0231 [place] N Performed at [facility]     Performed at [facility]   Culture     Final   Value: NORMAL OROPHARYNGEAL FLORA     Performed at [facility]   Report Status 01/26/2014 FINAL   Final     Labs: Basic Metabolic Panel:  Recent Labs Lab 01/23/14 0051 01/23/14 1554 01/24/14 0051 01/25/14 0850 01/26/14 0308  NA  --  140 133* 133* 135*  K  --  4.1 4.3 4.8 4.9  CL  --  103 96 99 100  CO2  --   --  _0 GLUCOSE  --  228* 194* 264* 169*  BUN  --  21 26* 31* 33*  CREATININE  --  2.20* 2.03* 1.95* 2.15*  CALCIUM  --   --  9.4 8.9 8.8  MG 1.6  --    --   --   --    Liver Function Tests: No results found for this basename: AST, ALT, ALKPHOS, BILITOT, PROT, ALBUMIN,  in the last 168 hours No results found for this basename: LIPASE, AMYLASE,  in the last 168 hours No results found for this basename: AMMONIA,  in the last 168 hours CBC:  Recent Labs Lab 01/23/14 1540 01/23/14 1554  WBC 9.0  --   NEUTROABS 7.8*  --   HGB 14.8 16.0  HCT 42.7 47.0  MCV 91.2  --   PLT 228  --    Cardiac Enzymes: No results found for this basename: CKTOTAL, CKMB, CKMBINDEX, TROPONINI,  in the last 168 hours BNP: BNP (last 3 results) No results found for this basename: PROBNP,  in the last 8760 hours CBG:  Recent Labs Lab 01/25/14 0646 01/25/14 1150 01/25/14 1631 01/25/14 2106 01/26/14 0654  GLUCAP 166* 186* 193* 251* 232*       Signed:  Charlynne Cousins  [facility] 01/26/2014, 11:23 AM

## 2014-01-27 ENCOUNTER — Encounter (HOSPITAL_COMMUNITY): Payer: Self-pay | Admitting: Emergency Medicine

## 2014-01-27 ENCOUNTER — Emergency Department (HOSPITAL_COMMUNITY)
Admission: EM | Admit: 2014-01-27 | Discharge: 2014-01-28 | Disposition: A | Discharge status: Home / Self Care | Payer: Medicaid Other | Attending: Emergency Medicine | Admitting: Emergency Medicine

## 2014-01-27 DIAGNOSIS — IMO0002 Reserved for concepts with insufficient information to code with codable children: Secondary | ICD-10-CM | POA: Insufficient documentation

## 2014-01-27 DIAGNOSIS — J45909 Unspecified asthma, uncomplicated: Secondary | ICD-10-CM | POA: Insufficient documentation

## 2014-01-27 DIAGNOSIS — E119 Type 2 diabetes mellitus without complications: Secondary | ICD-10-CM | POA: Insufficient documentation

## 2014-01-27 DIAGNOSIS — Z79899 Other long term (current) drug therapy: Secondary | ICD-10-CM | POA: Insufficient documentation

## 2014-01-27 DIAGNOSIS — R066 Hiccough: Secondary | ICD-10-CM | POA: Insufficient documentation

## 2014-01-27 DIAGNOSIS — R000 Tachycardia, unspecified: Secondary | ICD-10-CM | POA: Insufficient documentation

## 2014-01-27 DIAGNOSIS — R062 Wheezing: Secondary | ICD-10-CM

## 2014-01-27 DIAGNOSIS — I10 Essential (primary) hypertension: Secondary | ICD-10-CM | POA: Insufficient documentation

## 2014-01-27 LAB — POCT I-STAT TROPONIN I: Troponin i, poc: 0.01 ng/mL (ref 0.00–0.08)

## 2014-01-27 MED ORDER — PANTOPRAZOLE SODIUM 40 MG PO TBEC
80.0000 mg | DELAYED_RELEASE_TABLET | Freq: Once | ORAL | Status: AC
Start: 2014-01-27 — End: 2014-01-27
  Administered 2014-01-27: 80 mg via ORAL
  Filled 2014-01-27: qty 2

## 2014-01-27 MED ORDER — ALBUTEROL SULFATE (2.5 MG/3ML) 0.083% IN NEBU
5.0000 mg | INHALATION_SOLUTION | Freq: Once | RESPIRATORY_TRACT | Status: AC
  Administered 2014-01-27: 5 mg via RESPIRATORY_TRACT
  Filled 2014-01-27: qty 6

## 2014-01-27 MED ORDER — METOCLOPRAMIDE HCL 5 MG/ML IJ SOLN
10.0000 mg | Freq: Once | INTRAMUSCULAR | Status: AC
Start: 2014-01-27 — End: 2014-01-27
  Administered 2014-01-27: 10 mg via INTRAMUSCULAR
  Filled 2014-01-27: qty 2

## 2014-01-27 NOTE — ED Notes (Signed)
Reports hiccups off and on since 1991.  States he was recently hospitalized and had same, was given GI cocktail and reglan which helped him get some rest.  When asked what has been done in the past to help him, he told me "nothing.  They tell me there is nothing they can do"

## 2014-01-27 NOTE — ED Provider Notes (Signed)
CSN: [id number]     Arrival date & time 01/27/14  2220 History  This chart was scribed for non-physician practitioner Abigail Butts, PA-C working with Carmin Muskrat, MD by Zettie Pho, ED Scribe. This patient was seen in room TR07C/TR07C and the patient's care was started at 11:16 PM.    Chief Complaint  Patient presents with  . Hiccups   The history is provided by the patient. No language interpreter was used.   HPI Comments: Walter Harmon is a 48 y.o. male who presents to the Emergency Department complaining of persistent hiccups onset about 5 days ago. He reports this has happened in the past and it has been his GERD.  He has no hx of MI or CAD.  Echo in 2013 normal and no previous cardiac etiology for his hiccups has been found.  Patient was admitted to and discharged from the hospital on 01/26/2014 for an asthma exacerbation.  He states that his hiccups presented during his stay at the hospital and he received a GI cocktail and Reglan, which he states was able to provide temporary relief of the hiccups. Patient was discharged with prednisone and states that he last used an albuterol breathing treatment yesterday. He denies chest pain, shortness of breath. Patient has a history of asthma, DM, and HTN.     Past Medical History  Diagnosis Date  . Asthma   . Diabetes mellitus   . Hypertension    Past Surgical History  Procedure Laterality Date  . Ankle surgery     Family History  Problem Relation Age of Onset  . Diabetes type II Mother    History  Substance Use Topics  . Smoking status: Never Smoker   . Smokeless tobacco: Never Used  . Alcohol Use: No    Review of Systems  Constitutional: Negative for fever, diaphoresis, appetite change, fatigue and unexpected weight change.  HENT: Negative for mouth sores.   Eyes: Negative for visual disturbance.  Respiratory: Negative for cough, chest tightness, shortness of breath and wheezing.        Hiccups  Cardiovascular:  Negative for chest pain.  Gastrointestinal: Negative for nausea, vomiting, abdominal pain, diarrhea and constipation.  Endocrine: Negative for polydipsia, polyphagia and polyuria.  Genitourinary: Negative for dysuria, urgency, frequency and hematuria.  Musculoskeletal: Negative for back pain and neck stiffness.  Skin: Negative for rash.  Allergic/Immunologic: Negative for immunocompromised state.  Neurological: Negative for syncope, light-headedness and headaches.  Hematological: Does not bruise/bleed easily.  Psychiatric/Behavioral: Negative for sleep disturbance. The patient is not nervous/anxious.     Allergies  Review of patient's allergies indicates no known allergies.  Home Medications   Current Outpatient Rx  Name  Route  Sig  Dispense  Refill  . budesonide-formoterol (SYMBICORT) 160-4.5 MCG/ACT inhaler   Inhalation   Inhale 2 puffs into the lungs 2 (two) times daily.   3 Inhaler   3   . cycloSPORINE modified (NEORAL) 50 MG capsule   Oral   Take 150 mg by mouth every morning. Prescribed for 150mg  twice daily. Patient takes 150mg  once daily in the morning. The evening dose causes his reflux to act up.         Marland Kitchen glipiZIDE (GLUCOTROL) 5 MG tablet   Oral   Take 0.5 tablets (2.5 mg total) by mouth daily before breakfast.   30 tablet   0   . lisinopril (PRINIVIL,ZESTRIL) 20 MG tablet   Oral   Take 20 mg by mouth daily.         Marland Kitchen  mometasone (NASONEX) 50 MCG/ACT nasal spray   Nasal   Place 2 sprays into the nose daily.         . pravastatin (PRAVACHOL) 20 MG tablet   Oral   Take 20 mg by mouth daily.         . predniSONE (DELTASONE) 10 MG tablet      Takes 6 tablets for 1 days, then 5 tablets for 1 days, then 4 tablets for 1 days, then 3 tablets for 1 days, then 2 tabs for 1 days, then 1 tab for 1 days, and then stop.   31 tablet   0   . PROAIR HFA 108 (90 BASE) MCG/ACT inhaler   Inhalation   Inhale 2 puffs into the lungs every 6 (six) hours as needed for  wheezing.   3 Inhaler   3     Dispense as written.   Marland Kitchen levofloxacin (LEVAQUIN) 750 MG tablet   Oral   Take 1 tablet (750 mg total) by mouth every other day.   3 tablet   0   . metoCLOPramide (REGLAN) 10 MG tablet   Oral   Take 1 tablet (10 mg total) by mouth every 6 (six) hours.   30 tablet   0    Triage Vitals: BP 128/71  Pulse 101  Temp(Src) 97.3 F (36.3 C) (Oral)  Resp 18  SpO2 97%  Physical Exam  Nursing note and vitals reviewed. Constitutional: He is oriented to person, place, and time. He appears well-developed and well-nourished. No distress.  Awake, alert, nontoxic appearance Patient hiccups constantly  HENT:  Head: Normocephalic and atraumatic.  Mouth/Throat: Uvula is midline, oropharynx is clear and moist and mucous membranes are normal. Mucous membranes are not dry. No uvula swelling. No oropharyngeal exudate, posterior oropharyngeal edema, posterior oropharyngeal erythema or tonsillar abscesses.  Eyes: Conjunctivae are normal. Pupils are equal, round, and reactive to light. No scleral icterus.  Neck: Normal range of motion. Neck supple.  Cardiovascular: Regular rhythm, normal heart sounds and intact distal pulses.  Tachycardia present.   No murmur heard. Mild tachycardia  Pulmonary/Chest: Effort normal. No respiratory distress. He has wheezes.  Breath sounds significantly diminished in the lower fields. Wheezes throughout all lung fields bilaterally.   Abdominal: Soft. Bowel sounds are normal. He exhibits no distension and no mass. There is no tenderness. There is no rebound and no guarding.  Musculoskeletal: Normal range of motion. He exhibits no edema.  Lymphadenopathy:    He has no cervical adenopathy.  Neurological: He is alert and oriented to person, place, and time. He exhibits normal muscle tone. Coordination normal.  Speech is clear and goal oriented Moves extremities without ataxia  Skin: Skin is warm and dry. No rash noted. He is not diaphoretic.  No erythema.  Psychiatric: He has a normal mood and affect.    ED Course  Procedures (including critical care time)  DIAGNOSTIC STUDIES: Oxygen Saturation is 97% on room air, normal by my interpretation.    COORDINATION OF CARE: 11:22 PM- Ordered blood labs and an EKG. Will order Reglan and a breathing treatment to manage symptoms. Will consult with Dr. Vanita Panda. Discussed treatment plan with patient at bedside and patient verbalized agreement.     Labs Review Labs Reviewed  POCT I-STAT TROPONIN I   Imaging Review No results found.  EKG Interpretation   None      ECG:  Date: 01/28/2014  Rate: 104  Rhythm: sinus tachycardia  QRS Axis: normal  Intervals: normal  ST/T Wave abnormalities: nonspecific T wave changes  Conduction Disutrbances:none  Narrative Interpretation: T wave inversions inferiorly and anterior laterally, unchanged from 08/14/2012  Old EKG Reviewed: unchanged    MDM   1. Hiccups   2. Asthma   3. Wheezing      Walter Harmon presents with persistent hiccups for approximately 5 days.  Denies CP, SOB.  Pt was recently admitted for an asthma exacerbation and hiccups developed in the hospital.  Still wheezing on exam. Will provide Reglan, Protonix for hiccups and albuterol for wheezing.  Concern for possible cardiac etiology. ECG with T wave inversions inferiorly and anterior laterally but unchanged from previous. Troponin negative.  Review shows the patient had echocardiogram in 2013 with conclusions below.   08/11/2012 Echo: Study Conclusions  Left ventricle: The cavity size was normal. Systolic function was normal. The estimated ejection fraction was in the range of 60% to 65%. Wall motion was normal; there were no regional wall motion abnormalities. The study is not technically sufficient to allow evaluation of LV diastolic function. Transthoracic echocardiography. M-mode, complete 2D, spectral Doppler, and color Doppler. Height: Height:  165.1cm. Height: 65in. Weight: Weight: 81.8kg. Weight: 180lb. Body mass index: BMI: 30kg/m^2. Body surface area: BSA: 1.50m^2. Blood pressure: 126/76. Patient status: Inpatient. Location: Bedside.     Patient with complete resolution of his hiccups after treatment. Also with complete resolution of wheezing. No evidence of cardiac etiology for his hiccups and likely GERD.  He states he feels much better. I recommend close primary care followup in he says that he plans to do so.  It has been determined that no acute conditions requiring further emergency intervention are present at this time. The patient/guardian have been advised of the diagnosis and plan. We have discussed signs and symptoms that warrant return to the ED, such as changes or worsening in symptoms.   Vital signs are stable at discharge.   BP 106/64  Pulse 96  Temp(Src) 97.3 F (36.3 C) (Oral)  Resp 20  SpO2 99%  Patient/guardian has voiced understanding and agreed to follow-up with the PCP or specialist.  I personally performed the services described in this documentation, which was scribed in my presence. The recorded information has been reviewed and is accurate.   Jarrett Soho Arthella Headings, PA-C 01/28/14 [phone]

## 2014-01-27 NOTE — ED Notes (Signed)
Pt. reports persistent hicups for several days , denies nausea or vomitting , respirations unlabored .

## 2014-01-28 MED ORDER — METOCLOPRAMIDE HCL 10 MG PO TABS: 10.0000 mg | ORAL_TABLET | Freq: Four times a day (QID) | ORAL | Status: DC

## 2014-01-28 NOTE — Discharge Instructions (Signed)
1. Medications: reglan, usual home medications 2. Treatment: rest, drink plenty of fluids,  3. Follow Up: Please followup with your primary doctor for discussion of your diagnoses and further evaluation after today's visit; if you do not have a primary care doctor use the resource guide provided to find one;     Hiccups Hiccups are caused by a sudden contraction of the muscles between the ribs and the muscle under your lungs (diaphragm). When you hiccup, the top of your windpipe (glottis) closes immediately after your diaphragm contracts. This makes the typical 'hic' sound. A hiccup is a reflex that you cannot stop. Unlike other reflexes such as coughing and sneezing, hiccups do not seem to have any useful purpose. There are 3 types of hiccups:   Benign bouts: last less than 48 hours.  Persistent: last more than 48 hours but less than 1 month.  Intractable: last more than 1 month. CAUSES  Most people have bouts of hiccups from time to time. They start for no apparent reason, last a short while, and then stop. Sometimes they are due to:  A temporary swollen stomach caused by overeating or eating too fast, eating spicy foods, drinking fizzy drinks, or swallowing air.  A sudden change in temperature (very hot or cold foods or drinks, a cold shower).  Drinking alcohol or using tobacco. There are no particular tests used to diagnose hiccups. Hiccups are usually considered harmless and do not point to a serious medical condition. However, there can be underlying medical problems that may cause hiccups, such as pneumonia, diabetes, metabolic problems, tumors, abdominal infections or injuries, and neurologic problems.You must follow up with your caregiver if your symptoms persist or become a frequent problem. TREATMENT   Most cases need no treatment. A bout of benign hiccups usually does not last long.  Medicine is sometimes needed to stop persistent hiccups. Medicine may be given intravenously  (IV) or by mouth.  Hypnosis or acupuncture may be suggested.  Surgery to affect the nerve that supplies the diaphragm may be tried in severe cases.  Treatment of an underlying cause is needed in some cases. HOME CARE INSTRUCTIONS  Popular remedies that may stop a bout of hiccups include:  Gargling ice water.  Swallowing granulated sugar.  Biting on a lemon.  Holding your breath, breathing fast, or breathing into a paper bag.  Bearing down.  Gasping after a sudden fright.  Pulling your tongue gently.  Distraction. SEEK MEDICAL CARE IF:   Hiccups last for more than 48 hours.  You are given medicine but your hiccups do not get better.  New symptoms show up.  You cannot sleep or eat due to the hiccups.  You have unexpected weight loss.  You have trouble breathing or swallowing.  You develop severe pain in your abdomen or other areas.  You develop numbness, tingling, or weakness. Document Released: 02/17/2002 Document Revised: 03/02/2012 Document Reviewed: 01/30/2011 [facility] Patient Information 2014 [place], [place].

## 2014-01-29 LAB — CULTURE, BLOOD (ROUTINE X 2)
Culture: NO GROWTH
Culture: NO GROWTH

## 2014-01-31 NOTE — ED Provider Notes (Signed)
  Medical screening examination/treatment/procedure(s) were performed by non-physician practitioner and as supervising physician I was immediately available for consultation/collaboration.  EKG Interpretation    Date/Time:  Thursday January 27 2014 22:37:25 EST Ventricular Rate:  104 PR Interval:  124 QRS Duration: 96 QT Interval:  320 QTC Calculation: 420 R Axis:   37 Text Interpretation:  Sinus tachycardia Minimal voltage criteria for LVH, may be normal variant ST \\T \ T wave abnormality, consider inferior ischemia ST \\T \ T wave abnormality, consider anterolateral ischemia Abnormal ECG ED PHYSICIAN INTERPRETATION AVAILABLE IN [facility] Confirmed by TEST, RECORD ([id number]) on 01/29/2014 11:26:56 AM               Carmin Muskrat, MD 01/31/14 [phone]

## 2014-02-01 ENCOUNTER — Emergency Department (HOSPITAL_COMMUNITY)
Admission: EM | Admit: 2014-02-01 | Discharge: 2014-02-01 | Disposition: A | Discharge status: Home / Self Care | Payer: Medicaid Other | Source: Home / Self Care | Attending: Emergency Medicine | Admitting: Emergency Medicine

## 2014-02-01 ENCOUNTER — Encounter (HOSPITAL_COMMUNITY): Payer: Self-pay | Admitting: Emergency Medicine

## 2014-02-01 ENCOUNTER — Emergency Department (HOSPITAL_COMMUNITY)
Admission: EM | Admit: 2014-02-01 | Discharge: 2014-02-01 | Disposition: A | Discharge status: Home / Self Care | Payer: Medicaid Other | Attending: Emergency Medicine | Admitting: Emergency Medicine

## 2014-02-01 DIAGNOSIS — R12 Heartburn: Secondary | ICD-10-CM

## 2014-02-01 DIAGNOSIS — R066 Hiccough: Secondary | ICD-10-CM | POA: Insufficient documentation

## 2014-02-01 DIAGNOSIS — J45909 Unspecified asthma, uncomplicated: Secondary | ICD-10-CM

## 2014-02-01 DIAGNOSIS — IMO0002 Reserved for concepts with insufficient information to code with codable children: Secondary | ICD-10-CM | POA: Insufficient documentation

## 2014-02-01 DIAGNOSIS — Z79899 Other long term (current) drug therapy: Secondary | ICD-10-CM

## 2014-02-01 DIAGNOSIS — R1110 Vomiting, unspecified: Secondary | ICD-10-CM

## 2014-02-01 DIAGNOSIS — Z792 Long term (current) use of antibiotics: Secondary | ICD-10-CM

## 2014-02-01 DIAGNOSIS — R1013 Epigastric pain: Secondary | ICD-10-CM

## 2014-02-01 DIAGNOSIS — E119 Type 2 diabetes mellitus without complications: Secondary | ICD-10-CM

## 2014-02-01 DIAGNOSIS — I10 Essential (primary) hypertension: Secondary | ICD-10-CM | POA: Insufficient documentation

## 2014-02-01 MED ORDER — FAMOTIDINE 20 MG PO TABS: 20.0000 mg | ORAL_TABLET | Freq: Two times a day (BID) | ORAL | Status: DC

## 2014-02-01 MED ORDER — GI COCKTAIL ~~LOC~~
30.0000 mL | Freq: Once | ORAL | Status: AC
Start: 2014-02-01 — End: 2014-02-01
  Administered 2014-02-01: 30 mL via ORAL
  Filled 2014-02-01: qty 30

## 2014-02-01 MED ORDER — OMEPRAZOLE 20 MG PO CPDR: 20.0000 mg | DELAYED_RELEASE_CAPSULE | Freq: Every day | ORAL | Status: DC

## 2014-02-01 MED ORDER — METOCLOPRAMIDE HCL 5 MG/ML IJ SOLN
10.0000 mg | Freq: Once | INTRAMUSCULAR | Status: DC
  Filled 2014-02-01: qty 2

## 2014-02-01 MED ORDER — CHLORPROMAZINE HCL 25 MG PO TABS
25.0000 mg | ORAL_TABLET | Freq: Once | ORAL | Status: AC
  Administered 2014-02-01: 25 mg via ORAL
  Filled 2014-02-01: qty 1

## 2014-02-01 MED ORDER — METOCLOPRAMIDE HCL 5 MG/ML IJ SOLN
10.0000 mg | Freq: Once | INTRAMUSCULAR | Status: AC
  Administered 2014-02-01: 10 mg via INTRAMUSCULAR
  Filled 2014-02-01: qty 2

## 2014-02-01 MED ORDER — CHLORPROMAZINE HCL 25 MG/ML IJ SOLN: 25.0000 mg | Freq: Once | INTRAMUSCULAR | Status: DC

## 2014-02-01 MED ORDER — METOCLOPRAMIDE HCL 5 MG/ML IJ SOLN
10.0000 mg | Freq: Once | INTRAMUSCULAR | Status: AC
  Administered 2014-02-01: 10 mg via INTRAMUSCULAR

## 2014-02-01 NOTE — Discharge Instructions (Signed)
Speak to your doctor about treatment with Thorazine for hiccups. Begin taking Prilosec as prescribed. You may discontinue Pepcid. Schedule an appointment with your primary care doctor regarding symptoms.  Hiccups Hiccups are caused by a sudden contraction of the muscles between the ribs and the muscle under your lungs (diaphragm). When you hiccup, the top of your windpipe (glottis) closes immediately after your diaphragm contracts. This makes the typical 'hic' sound. A hiccup is a reflex that you cannot stop. Unlike other reflexes such as coughing and sneezing, hiccups do not seem to have any useful purpose. There are 3 types of hiccups:   Benign bouts: last less than 48 hours.  Persistent: last more than 48 hours but less than 1 month.  Intractable: last more than 1 month. CAUSES  Most people have bouts of hiccups from time to time. They start for no apparent reason, last a short while, and then stop. Sometimes they are due to:  A temporary swollen stomach caused by overeating or eating too fast, eating spicy foods, drinking fizzy drinks, or swallowing air.  A sudden change in temperature (very hot or cold foods or drinks, a cold shower).  Drinking alcohol or using tobacco. There are no particular tests used to diagnose hiccups. Hiccups are usually considered harmless and do not point to a serious medical condition. However, there can be underlying medical problems that may cause hiccups, such as pneumonia, diabetes, metabolic problems, tumors, abdominal infections or injuries, and neurologic problems.You must follow up with your caregiver if your symptoms persist or become a frequent problem. TREATMENT   Most cases need no treatment. A bout of benign hiccups usually does not last long.  Medicine is sometimes needed to stop persistent hiccups. Medicine may be given intravenously (IV) or by mouth.  Hypnosis or acupuncture may be suggested.  Surgery to affect the nerve that supplies the  diaphragm may be tried in severe cases.  Treatment of an underlying cause is needed in some cases. HOME CARE INSTRUCTIONS  Popular remedies that may stop a bout of hiccups include:  Gargling ice water.  Swallowing granulated sugar.  Biting on a lemon.  Holding your breath, breathing fast, or breathing into a paper bag.  Bearing down.  Gasping after a sudden fright.  Pulling your tongue gently.  Distraction. SEEK MEDICAL CARE IF:   Hiccups last for more than 48 hours.  You are given medicine but your hiccups do not get better.  New symptoms show up.  You cannot sleep or eat due to the hiccups.  You have unexpected weight loss.  You have trouble breathing or swallowing.  You develop severe pain in your abdomen or other areas.  You develop numbness, tingling, or weakness. Document Released: 02/17/2002 Document Revised: 03/02/2012 Document Reviewed: 01/30/2011 [facility] Patient Information 2014 [place], [place].

## 2014-02-01 NOTE — ED Notes (Signed)
Pt reports has been having persistent hiccups off and on for the past week. Pt reports heartburn and abdominal pain from hiccupping so much.

## 2014-02-01 NOTE — ED Provider Notes (Signed)
CSN: [id number]     Arrival date & time 02/01/14  2201 History  This chart was scribed for non-physician practitioner, Antonietta Breach, PA-C,working with Threasa Beards, MD, by Marlowe Kays, ED Scribe.  This patient was seen in room WTR7/WTR7 and the patient's care was started at 10:49 PM.  Chief Complaint  Patient presents with  . Hiccups   The history is provided by the patient. No language interpreter was used.   HPI Comments:  Walter Harmon is a 48 y.o. male who presents to the Emergency Department complaining of persistent hiccups for one week that reoccurred approximately 12 hours ago. Pt states he was seen at [facility] yesterday for the same issue. He states when he left [facility] his hiccups had resolved. Pt reports associated heartburn, abdominal pain and four episodes of emesis. He states he took his normal Reglan with no relief. He reports being able to keep water down without immediate emesis. He reports having a normal bowel movement today. He denies numbness or weakness of BUE, LOC, CP, or SOB. He denies h/o MI or CAD. He denies seeing his PCP for this issue since it restarted last week. He reports h/o intractable hiccups for the past 22 years.   Past Medical History  Diagnosis Date  . Asthma   . Diabetes mellitus   . Hypertension    Past Surgical History  Procedure Laterality Date  . Ankle surgery     Family History  Problem Relation Age of Onset  . Diabetes type II Mother    History  Substance Use Topics  . Smoking status: Never Smoker   . Smokeless tobacco: Never Used  . Alcohol Use: No    Review of Systems  Gastrointestinal: Positive for vomiting and abdominal pain.  Neurological: Negative for syncope, weakness and numbness.  All other systems reviewed and are negative.   Allergies  Review of patient's allergies indicates no known allergies.  Home Medications   Current Outpatient Rx  Name  Route  Sig  Dispense  Refill  . budesonide-formoterol (SYMBICORT)  160-4.5 MCG/ACT inhaler   Inhalation   Inhale 2 puffs into the lungs 2 (two) times daily.   3 Inhaler   3   . cycloSPORINE modified (NEORAL) 50 MG capsule   Oral   Take 150 mg by mouth 2 (two) times daily.          Marland Kitchen glipiZIDE (GLUCOTROL) 5 MG tablet   Oral   Take 0.5 tablets (2.5 mg total) by mouth daily before breakfast.   30 tablet   0   . levofloxacin (LEVAQUIN) 750 MG tablet   Oral   Take 1 tablet (750 mg total) by mouth every other day.   3 tablet   0   . lisinopril (PRINIVIL,ZESTRIL) 20 MG tablet   Oral   Take 20 mg by mouth daily.         . metoCLOPramide (REGLAN) 10 MG tablet   Oral   Take 1 tablet (10 mg total) by mouth every 6 (six) hours.   30 tablet   0   . mometasone (NASONEX) 50 MCG/ACT nasal spray   Nasal   Place 2 sprays into the nose daily.         . pravastatin (PRAVACHOL) 20 MG tablet   Oral   Take 20 mg by mouth daily.         . predniSONE (DELTASONE) 10 MG tablet   Oral   Take 10-30 mg by mouth as directed. Tapered  dose:  Take 3 tablets daily (patient took this dose 02/01/14) Take 2 tablets daily Take 1 tablet daily Stop         . PROAIR HFA 108 (90 BASE) MCG/ACT inhaler   Inhalation   Inhale 2 puffs into the lungs every 6 (six) hours as needed for wheezing.   3 Inhaler   3     Dispense as written.   Marland Kitchen omeprazole (PRILOSEC) 20 MG capsule   Oral   Take 1 capsule (20 mg total) by mouth daily.   30 capsule   0    Triage Vitals: BP 133/83  Pulse 95  Temp(Src) 99 F (37.2 C) (Oral)  Resp 20  Ht 5\' 5"  (1.651 m)  Wt 183 lb (83.008 kg)  BMI 30.45 kg/m2  SpO2 95%  Physical Exam  Nursing note and vitals reviewed. Constitutional: He is oriented to person, place, and time. He appears well-developed and well-nourished. No distress.  Patient is pleasant. Speech is goal oriented. He is in no acute distress.  HENT:  Head: Normocephalic and atraumatic.  Mouth/Throat: Oropharynx is clear and moist. No oropharyngeal exudate.   Oropharynx clear. Patient tolerating secretions without difficulty.  Eyes: Conjunctivae and EOM are normal. No scleral icterus.  Neck: Normal range of motion. Neck supple.  Cardiovascular: Normal rate, regular rhythm and normal heart sounds.   Pulmonary/Chest: Effort normal and breath sounds normal. No stridor. No respiratory distress. He has no wheezes. He has no rales.  Intermittent hiccups. No retractions or accessory muscle use. Patient in no acute respiratory distress.  Abdominal: Soft. He exhibits no distension. There is tenderness (Mild epigastric). There is no rebound and no guarding.  No peritoneal signs or evidence of acute surgical abdomen  Musculoskeletal: Normal range of motion.  Neurological: He is alert and oriented to person, place, and time.  Patient moves extremities without ataxia  Skin: Skin is warm and dry. No rash noted. He is not diaphoretic. No erythema. No pallor.  Psychiatric: He has a normal mood and affect. His behavior is normal.    ED Course  Procedures (including critical care time) DIAGNOSTIC STUDIES: Oxygen Saturation is 95% on RA, normal by my interpretation.   COORDINATION OF CARE: 10:56 PM- Will repeat EKG and order a GI Cocktail. Pt verbalizes understanding and agrees to plan.  Medications  gi cocktail (Maalox,Lidocaine,Donnatal) (30 mLs Oral Given 02/01/14 2259)  metoCLOPramide (REGLAN) injection 10 mg (10 mg Intramuscular Given 02/01/14 2318)  chlorproMAZINE (THORAZINE) tablet 25 mg (25 mg Oral Given 02/01/14 2354)   Labs Review Labs Reviewed - No data to display Imaging Review No results found.  EKG Interpretation    Date/Time:  Tuesday February 01 2014 23:08:14 EST Ventricular Rate:  86 PR Interval:  131 QRS Duration: 98 QT Interval:  317 QTC Calculation: 379 R Axis:   44 Text Interpretation:  Age not entered, assumed to be  48 years old for purpose of ECG interpretation Sinus rhythm Probable left ventricular hypertrophy Baseline  wander in lead(s) V1 No significant change since last tracing Confirmed by WARD  DO, KRISTEN ([id number]) on 02/01/2014 11:11:34 PM            MDM   Final diagnoses:  Intractable hiccups   48 year old male presents for intractable hiccups after discharged this morning from [facility] for similar complaint. Patient states he called his primary care doctor this morning and told him he was seen at [facility], but that symptoms have recurred. Per patient, he states his primary care doctor  told him to go to [facility]. Patient denies associated chest pain and shortness of breath. He has no history of ACS and a history of abnormal echo 6 months ago. Patient again given 10 mg IM Reglan in ED. This has ceased patient's hiccups. EKG today is unchanged from prior. Believe he is stable for discharge with instruction to followup with his primary care doctor. Have also recommended that patient discussed treatment with Thorazine with his primary care doctor. I will not start him on this today as he is currently on Levaquin and both medications may prolong QT interval. Return precautions provided and patient agreeable to plan with no unaddressed concerns.  I personally performed the services described in this documentation, which was scribed in my presence. The recorded information has been reviewed and is accurate.   Filed Vitals:   02/01/14 2241  BP: 133/83  Pulse: 95  Temp: 99 F (37.2 C)  TempSrc: Oral  Resp: 20  Height: 5\' 5"  (1.651 m)  Weight: 183 lb (83.008 kg)  SpO2: 95%     Antonietta Breach, PA-C 02/02/14 0004

## 2014-02-01 NOTE — ED Notes (Addendum)
Pt reports that he has been having intractable hiccups since Thursday. Pt states that he was given Reglan to take at home, but it has not helped. Pt states that his hiccups get so bad that he vomits, which he states is "red" in color. Pt is not actively hiccuping at this time.

## 2014-02-01 NOTE — ED Notes (Signed)
Pt. reports recurrent hiccups , seen here 5 days ago for the same complaints unrelieved by prescription medications  , denies pain or SOB .

## 2014-02-01 NOTE — ED Provider Notes (Signed)
CSN: [id number]     Arrival date & time 02/01/14  0116 History   First MD Initiated Contact with Patient 02/01/14 0602     Chief Complaint  Patient presents with  . Hiccups     (Consider location/radiation/quality/duration/timing/severity/associated sxs/prior Treatment) HPI MATTHE PLUMLEY is a 48 y.o. male presents to emergency department with hiccups. Patient states that he has a history of frequent breakup spells where he gets hiccups and unable to stop for sometimes lower sometimes days. Patient states his regular doctor has him on Reglan for this. States that he took Reglan last night but he did not help his hiccups. He states he used to be on acid reflux medicine but is not taking it anymore at this time. He does report some acid reflux symptoms but none at present. Patient denies any chest pain or shortness of breath. He denies any other complaints other than hiccups. He states he has had him for a day now. He states nothing makes his symptoms better or worse. He was seen for the same 5 days ago.  Past Medical History  Diagnosis Date  . Asthma   . Diabetes mellitus   . Hypertension    Past Surgical History  Procedure Laterality Date  . Ankle surgery     Family History  Problem Relation Age of Onset  . Diabetes type II Mother    History  Substance Use Topics  . Smoking status: Never Smoker   . Smokeless tobacco: Never Used  . Alcohol Use: No    Review of Systems  Constitutional: Negative for fever and chills.  Respiratory: Negative for cough, chest tightness and shortness of breath.   Cardiovascular: Negative for chest pain, palpitations and leg swelling.  Gastrointestinal: Negative for nausea, vomiting, abdominal pain, diarrhea and abdominal distention.  Genitourinary: Negative for dysuria, urgency, frequency and hematuria.  Musculoskeletal: Negative for arthralgias, myalgias, neck pain and neck stiffness.  Skin: Negative for rash.  Allergic/Immunologic: Negative  for immunocompromised state.  Neurological: Negative for dizziness, weakness, light-headedness, numbness and headaches.      Allergies  Review of patient's allergies indicates no known allergies.  Home Medications   Current Outpatient Rx  Name  Route  Sig  Dispense  Refill  . budesonide-formoterol (SYMBICORT) 160-4.5 MCG/ACT inhaler   Inhalation   Inhale 2 puffs into the lungs 2 (two) times daily.   3 Inhaler   3   . cycloSPORINE modified (NEORAL) 50 MG capsule   Oral   Take 150 mg by mouth every morning. Prescribed for 150mg  twice daily. Patient takes 150mg  once daily in the morning. The evening dose causes his reflux to act up.         . famotidine (PEPCID) 20 MG tablet   Oral   Take 1 tablet (20 mg total) by mouth 2 (two) times daily.   30 tablet   0   . glipiZIDE (GLUCOTROL) 5 MG tablet   Oral   Take 0.5 tablets (2.5 mg total) by mouth daily before breakfast.   30 tablet   0   . levofloxacin (LEVAQUIN) 750 MG tablet   Oral   Take 1 tablet (750 mg total) by mouth every other day.   3 tablet   0   . lisinopril (PRINIVIL,ZESTRIL) 20 MG tablet   Oral   Take 20 mg by mouth daily.         . metoCLOPramide (REGLAN) 10 MG tablet   Oral   Take 1 tablet (10 mg total) by  mouth every 6 (six) hours.   30 tablet   0   . mometasone (NASONEX) 50 MCG/ACT nasal spray   Nasal   Place 2 sprays into the nose daily.         . pravastatin (PRAVACHOL) 20 MG tablet   Oral   Take 20 mg by mouth daily.         . predniSONE (DELTASONE) 10 MG tablet      Takes 6 tablets for 1 days, then 5 tablets for 1 days, then 4 tablets for 1 days, then 3 tablets for 1 days, then 2 tabs for 1 days, then 1 tab for 1 days, and then stop.   31 tablet   0   . PROAIR HFA 108 (90 BASE) MCG/ACT inhaler   Inhalation   Inhale 2 puffs into the lungs every 6 (six) hours as needed for wheezing.   3 Inhaler   3     Dispense as written.    BP 127/75  Pulse 70  Temp(Src) 98 F (36.7 C)  (Oral)  Resp 18  SpO2 100% Physical Exam  Nursing note and vitals reviewed. Constitutional: He appears well-developed and well-nourished. No distress.  HENT:  Head: Normocephalic and atraumatic.  Eyes: Conjunctivae are normal.  Neck: Neck supple.  Cardiovascular: Normal rate, regular rhythm and normal heart sounds.   Pulmonary/Chest: Effort normal. No respiratory distress. He has no wheezes. He has no rales.  Abdominal: Soft. Bowel sounds are normal. He exhibits no distension. There is no tenderness. There is no rebound.  Musculoskeletal: He exhibits no edema.  Neurological: He is alert.  Skin: Skin is warm and dry.    ED Course  Procedures (including critical care time) Labs Review Labs Reviewed - No data to display Imaging Review No results found.  EKG Interpretation   None       MDM   Final diagnoses:  Hiccups   Patient is here with hiccups. He has history of the same. He has no chest pain or shortness of breath. He has no other symptoms. He was seen for the same 5 days ago, that time had been negative EKG and troponins. He has had an normal echo 6 months ago. He was received 10 mg of Reglan IM prior to me seeing him and his symptoms have resolved at this time. His lungs are clear on the exam. He does admit to intermittent acid reflux symptoms. I will start him on Pepcid for this. He does have Reglan at home. I have instructed him to call his primary care Dr. followup outpatient. Patient is completely symptom-free at this time. His vital signs are normal. He is stable for discharge.  Filed Vitals:   02/01/14 0121 02/01/14 0438 02/01/14 0637  BP: 126/90 119/83 127/75  Pulse: 121  70  Temp: 98.4 F (36.9 C) 98 F (36.7 C)   TempSrc: Oral Oral   Resp: 18    SpO2: 97% 100% 100%         Renold Genta, PA-C 02/01/14 0754

## 2014-02-01 NOTE — ED Provider Notes (Signed)
Medical screening examination/treatment/procedure(s) were performed by non-physician practitioner and as supervising physician I was immediately available for consultation/collaboration.   Teressa Lower, MD 02/01/14 2259

## 2014-02-01 NOTE — Discharge Instructions (Signed)
Continue taking reglan. Take pepcid for acid reflux. Follow up with your doctor as soon as able.    Hiccups Hiccups are caused by a sudden contraction of the muscles between the ribs and the muscle under your lungs (diaphragm). When you hiccup, the top of your windpipe (glottis) closes immediately after your diaphragm contracts. This makes the typical 'hic' sound. A hiccup is a reflex that you cannot stop. Unlike other reflexes such as coughing and sneezing, hiccups do not seem to have any useful purpose. There are 3 types of hiccups:   Benign bouts: last less than 48 hours.  Persistent: last more than 48 hours but less than 1 month.  Intractable: last more than 1 month. CAUSES  Most people have bouts of hiccups from time to time. They start for no apparent reason, last a short while, and then stop. Sometimes they are due to:  A temporary swollen stomach caused by overeating or eating too fast, eating spicy foods, drinking fizzy drinks, or swallowing air.  A sudden change in temperature (very hot or cold foods or drinks, a cold shower).  Drinking alcohol or using tobacco. There are no particular tests used to diagnose hiccups. Hiccups are usually considered harmless and do not point to a serious medical condition. However, there can be underlying medical problems that may cause hiccups, such as pneumonia, diabetes, metabolic problems, tumors, abdominal infections or injuries, and neurologic problems.You must follow up with your caregiver if your symptoms persist or become a frequent problem. TREATMENT   Most cases need no treatment. A bout of benign hiccups usually does not last long.  Medicine is sometimes needed to stop persistent hiccups. Medicine may be given intravenously (IV) or by mouth.  Hypnosis or acupuncture may be suggested.  Surgery to affect the nerve that supplies the diaphragm may be tried in severe cases.  Treatment of an underlying cause is needed in some cases. HOME  CARE INSTRUCTIONS  Popular remedies that may stop a bout of hiccups include:  Gargling ice water.  Swallowing granulated sugar.  Biting on a lemon.  Holding your breath, breathing fast, or breathing into a paper bag.  Bearing down.  Gasping after a sudden fright.  Pulling your tongue gently.  Distraction. SEEK MEDICAL CARE IF:   Hiccups last for more than 48 hours.  You are given medicine but your hiccups do not get better.  New symptoms show up.  You cannot sleep or eat due to the hiccups.  You have unexpected weight loss.  You have trouble breathing or swallowing.  You develop severe pain in your abdomen or other areas.  You develop numbness, tingling, or weakness. Document Released: 02/17/2002 Document Revised: 03/02/2012 Document Reviewed: 01/30/2011 [facility] Patient Information 2014 [place], [place].

## 2014-02-02 NOTE — ED Provider Notes (Signed)
Medical screening examination/treatment/procedure(s) were performed by non-physician practitioner and as supervising physician I was immediately available for consultation/collaboration.  EKG Interpretation    Date/Time:  Tuesday February 01 2014 23:08:14 EST Ventricular Rate:  86 PR Interval:  131 QRS Duration: 98 QT Interval:  317 QTC Calculation: 379 R Axis:   44 Text Interpretation:  Age not entered, assumed to be  48 years old for purpose of ECG interpretation Sinus rhythm Probable left ventricular hypertrophy Baseline wander in lead(s) V1 No significant change since last tracing Confirmed by WARD  DO, KRISTEN [phone]) on 02/01/2014 11:11:34 PM             Threasa Beards, MD 02/02/14 [id number]

## 2014-02-23 ENCOUNTER — Ambulatory Visit: Payer: Medicaid Other | Admitting: Adult Health

## 2014-02-25 ENCOUNTER — Encounter: Payer: Self-pay | Admitting: Adult Health

## 2014-02-25 ENCOUNTER — Ambulatory Visit (INDEPENDENT_AMBULATORY_CARE_PROVIDER_SITE_OTHER): Payer: Medicaid Other | Admitting: Adult Health

## 2014-02-25 VITALS — BP 130/74 | HR 95 | Temp 98.9°F | Ht 65.0 in | Wt 188.8 lb

## 2014-02-25 DIAGNOSIS — J45901 Unspecified asthma with (acute) exacerbation: Secondary | ICD-10-CM

## 2014-02-25 MED ORDER — BUDESONIDE-FORMOTEROL FUMARATE 160-4.5 MCG/ACT IN AERO: 2.0000 | INHALATION_SPRAY | Freq: Two times a day (BID) | RESPIRATORY_TRACT | Status: DC

## 2014-02-25 NOTE — Progress Notes (Signed)
Subjective:    Patient ID: Walter Harmon, male    DOB: September 08, 1966, 48 y.o.   MRN: [id number]  HPI 48 y.o. never smoker, referred for asthma evaluation on 09/09/13 with Dr. Lamonte Sakai   Patient has a history of diabetes and chronic kidney disease. He is followed by nephrology.  09/09/13 IOV   hx DM and asthma dx as a child. Also with rhinitis.  He has been on controller medications since '99, currently Symbicort. Has albuterol available to use prn. Unfortunately has been out of his meds for ~ 2 months. Flares infrequently, but has had more trouble when he runs out of meds. Was started on lisinopril 2 weeks ago, seems to be tolerating. Had spiro w Dr Donneta Romberg in 2011 - 12. Allergies are poorly controlled right now. Used to be on astelin and nasal steroid.  >restart Symbicort   02/25/14 [facility] follow up  Patient returns for a post hospital followup. Patient was admitted February 1 through February 4 for an acute asthma exacerbation with acute hypoxic respiratory failure. Chest x-ray without acute process noted. Patient was treated with IV antibiotics, and steroids. His virus panel was positive for rotavirus. He was discharged on a steroid taper and antibiotics. Patient says that he is much improved. He denies any increased wheezing, or cough. Patient has not been taken his Symbicort on a regular basis. Of note, patient is on an ACE inhibitor with lisinopril. Patient denies any fever or hemoptysis, orthopnea, PND, or leg swelling.   Review of Systems Constitutional:   No  weight loss, night sweats,  Fevers, chills, fatigue, or  lassitude.  HEENT:   No headaches,  Difficulty swallowing,  Tooth/dental problems, or  Sore throat,                No sneezing, itching, ear ache, nasal congestion, post nasal drip,   CV:  No chest pain,  Orthopnea, PND, swelling in lower extremities, anasarca, dizziness, palpitations, syncope.   GI  No heartburn, indigestion, abdominal pain, nausea, vomiting,  diarrhea, change in bowel habits, loss of appetite, bloody stools.   Resp: No shortness of breath with exertion or at rest.  No excess mucus, no productive cough,  No non-productive cough,  No coughing up of blood.  No change in color of mucus.  No wheezing.  No chest wall deformity  Skin: no rash or lesions.  GU: no dysuria, change in color of urine, no urgency or frequency.  No flank pain, no hematuria   MS:  No joint pain or swelling.  No decreased range of motion.  No back pain.  Psych:  No change in mood or affect. No depression or anxiety.  No memory loss.         Objective:   Physical Exam GEN: A/Ox3; pleasant , NAD, well nourished   HEENT:  [place]/AT,  EACs-clear, TMs-wnl, NOSE-clear, THROAT-clear, no lesions, no postnasal drip or exudate noted.   NECK:  Supple w/ fair ROM; no JVD; normal carotid impulses w/o bruits; no thyromegaly or nodules palpated; no lymphadenopathy.  RESP  Clear  P & A; w/o, wheezes/ rales/ or rhonchi.no accessory muscle use, no dullness to percussion  CARD:  RRR, no m/r/g  , no peripheral edema, pulses intact, no cyanosis or clubbing.  GI:   Soft & nt; nml bowel sounds; no organomegaly or masses detected.  Musco: Warm bil, no deformities or joint swelling noted.   Neuro: alert, no focal deficits noted.    Skin: Warm, no  lesions or rashes         Assessment & Plan:

## 2014-02-25 NOTE — Patient Instructions (Signed)
We will restart your Symbicort 2 puffs twice a day.  Please rinse and gargle after you take the Symbicort Use albuterol 2 puffs as needed for wheezing .  Discuss with family doctor as Lisinopril may be causing your cough to be worse.  Follow with Dr Lamonte Sakai in 6 weeks  or sooner if you have any problems. Please contact office for sooner follow up if symptoms do not improve or worsen or seek emergency care

## 2014-02-25 NOTE — Assessment & Plan Note (Signed)
Recent exacerbation , now resolved Patient encouraged on medication compliance May need to change ACE inhibitor get frequent exacerbations and cough persist as this may be an aggravating factor for his underlying asthma  Plan  We will restart your Symbicort 2 puffs twice a day.  Please rinse and gargle after you take the Symbicort Use albuterol 2 puffs as needed for wheezing .  Discuss with family doctor as Lisinopril may be causing your cough to be worse.  Follow with Dr Lamonte Sakai in 6 weeks  or sooner if you have any problems. Please contact office for sooner follow up if symptoms do not improve or worsen or seek emergency care

## 2014-04-13 ENCOUNTER — Ambulatory Visit (INDEPENDENT_AMBULATORY_CARE_PROVIDER_SITE_OTHER): Payer: Medicaid Other | Admitting: Emergency Medicine

## 2014-04-13 ENCOUNTER — Encounter: Payer: Self-pay | Admitting: Emergency Medicine

## 2014-04-13 VITALS — BP 130/78 | HR 76 | Ht 65.0 in | Wt 186.0 lb

## 2014-04-13 DIAGNOSIS — J449 Chronic obstructive pulmonary disease, unspecified: Secondary | ICD-10-CM

## 2014-04-13 MED ORDER — LORATADINE 10 MG PO TABS: 10.0000 mg | ORAL_TABLET | Freq: Every day | ORAL | Status: DC

## 2014-04-13 NOTE — Patient Instructions (Signed)
Please continue your Symbicort 2 puffs once a day Use albuterol 2 puffs as needed for shortness of breath Continue your nasonex 2 sprays each day Start loratadine 10mg  daily (at least through the allergy season) Try using OTC allergy relief containing chlorpheniramine as needed for drainage.  Follow with Dr Lamonte Sakai in 6 months or sooner if you have any problems

## 2014-04-13 NOTE — Progress Notes (Signed)
   Subjective:    Patient ID: Walter Harmon, male    DOB: June 30, 1966, 48 y.o.   MRN: [id number]  HPI 48 yo never smoker, referred for asthma evaluation on 09/09/13 with Dr. Lamonte Sakai   Patient has a history of diabetes and chronic kidney disease. He is followed by nephrology.  09/09/13 IOV   hx DM and asthma dx as a child. Also with rhinitis.  He has been on controller medications since '99, currently Symbicort. Has albuterol available to use prn. Unfortunately has been out of his meds for ~ 2 months. Flares infrequently, but has had more trouble when he runs out of meds. Was started on lisinopril 2 weeks ago, seems to be tolerating. Had spiro w Dr Donneta Romberg in 2011 - 12. Allergies are poorly controlled right now. Used to be on astelin and nasal steroid.  >restart Symbicort   02/25/14 [facility] follow up  Patient returns for a post hospital followup. Patient was admitted February 1 through February 4 for an acute asthma exacerbation with acute hypoxic respiratory failure. Chest x-ray without acute process noted. Patient was treated with IV antibiotics, and steroids. His virus panel was positive for rotavirus. He was discharged on a steroid taper and antibiotics. Patient says that he is much improved. He denies any increased wheezing, or cough. Patient has not been taken his Symbicort on a regular basis. Of note, patient is on an ACE inhibitor with lisinopril. Patient denies any fever or hemoptysis, orthopnea, PND, or leg swelling.  ROV 04/13/13 -- follows for asthma, allergic rhinitis. He is having more trouble lately with cough and mucous. He is on nasonex, has taken reliably.    Review of Systems As per HPI     Objective:   Physical Exam Filed Vitals:   04/13/14 1049  BP: 130/78  Pulse: 76  Height: 5\' 5"  (1.651 m)  Weight: 186 lb (84.369 kg)  SpO2: 98%    GEN: A/Ox3; pleasant , NAD, well nourished   HEENT:  [place]/AT,  EACs-clear, TMs-wnl, NOSE-clear, THROAT-clear, no lesions, no  postnasal drip or exudate noted.   NECK:  Supple w/ fair ROM; no JVD; normal carotid impulses w/o bruits; no thyromegaly or nodules palpated; no lymphadenopathy.  RESP  Clear  P & A; w/o, wheezes/ rales/ or rhonchi.no accessory muscle use  CARD:  RRR, no m/r/g  , no peripheral edema, pulses intact, no cyanosis or clubbing.  Musco: Warm bil, no deformities or joint swelling noted.   Neuro: alert, no focal deficits noted.    Skin: Warm, no lesions or rashes         Assessment & Plan:  Chronic obstructive asthma, unspecified Has been stable. Allergies have flared some this Spring.  - continue same BD regimen - add loratadine and chlorpheniramine to nasal steroid.  - he will also start [place] 6

## 2014-04-13 NOTE — Assessment & Plan Note (Signed)
Has been stable. Allergies have flared some this Spring.  - continue same BD regimen - add loratadine and chlorpheniramine to nasal steroid.  - he will also start [place] 6

## 2014-05-30 ENCOUNTER — Encounter (HOSPITAL_COMMUNITY): Payer: Self-pay | Admitting: Emergency Medicine

## 2014-05-30 ENCOUNTER — Emergency Department (HOSPITAL_COMMUNITY)
Admission: EM | Admit: 2014-05-30 | Discharge: 2014-05-30 | Disposition: A | Discharge status: Home / Self Care | Payer: Medicaid Other | Attending: Emergency Medicine | Admitting: Emergency Medicine

## 2014-05-30 ENCOUNTER — Emergency Department (HOSPITAL_COMMUNITY): Payer: Medicaid Other

## 2014-05-30 DIAGNOSIS — M549 Dorsalgia, unspecified: Secondary | ICD-10-CM | POA: Insufficient documentation

## 2014-05-30 DIAGNOSIS — E119 Type 2 diabetes mellitus without complications: Secondary | ICD-10-CM | POA: Insufficient documentation

## 2014-05-30 DIAGNOSIS — Z79899 Other long term (current) drug therapy: Secondary | ICD-10-CM | POA: Insufficient documentation

## 2014-05-30 DIAGNOSIS — N390 Urinary tract infection, site not specified: Secondary | ICD-10-CM | POA: Insufficient documentation

## 2014-05-30 DIAGNOSIS — R310 Gross hematuria: Secondary | ICD-10-CM | POA: Insufficient documentation

## 2014-05-30 DIAGNOSIS — I10 Essential (primary) hypertension: Secondary | ICD-10-CM | POA: Insufficient documentation

## 2014-05-30 DIAGNOSIS — J45909 Unspecified asthma, uncomplicated: Secondary | ICD-10-CM | POA: Insufficient documentation

## 2014-05-30 DIAGNOSIS — N289 Disorder of kidney and ureter, unspecified: Secondary | ICD-10-CM

## 2014-05-30 LAB — URINE MICROSCOPIC-ADD ON

## 2014-05-30 LAB — URINALYSIS, ROUTINE W REFLEX MICROSCOPIC
Bilirubin Urine: NEGATIVE
Glucose, UA: 100 mg/dL — AB
KETONES UR: NEGATIVE mg/dL
NITRITE: NEGATIVE
Protein, ur: >300 mg/dL — AB
Specific Gravity, Urine: 1.017 (ref 1.005–1.030)
UROBILINOGEN UA: 1 mg/dL (ref 0.0–1.0)
pH: 5.5 (ref 5.0–8.0)

## 2014-05-30 LAB — BASIC METABOLIC PANEL
BUN: 27 mg/dL — ABNORMAL HIGH (ref 6–23)
CALCIUM: 9.5 mg/dL (ref 8.4–10.5)
CO2: 26 mEq/L (ref 19–32)
Chloride: 101 mEq/L (ref 96–112)
Creatinine, Ser: 2.61 mg/dL — ABNORMAL HIGH (ref 0.50–1.35)
GFR calc Af Amer: 32 mL/min — ABNORMAL LOW (ref >90)
GFR calc non Af Amer: 28 mL/min — ABNORMAL LOW (ref >90)
GLUCOSE: 206 mg/dL — AB (ref 70–99)
Potassium: 5.8 mEq/L — ABNORMAL HIGH (ref 3.7–5.3)
SODIUM: 137 meq/L (ref 137–147)

## 2014-05-30 LAB — CBC WITH DIFFERENTIAL/PLATELET
BASOS ABS: 0 10*3/uL (ref 0.0–0.1)
Basophils Relative: 0 % (ref 0–1)
EOS ABS: 0.1 10*3/uL (ref 0.0–0.7)
EOS PCT: 1 % (ref 0–5)
HCT: 37.2 % — ABNORMAL LOW (ref 39.0–52.0)
Hemoglobin: 12.6 g/dL — ABNORMAL LOW (ref 13.0–17.0)
Lymphocytes Relative: 12 % (ref 12–46)
Lymphs Abs: 1.2 10*3/uL (ref 0.7–4.0)
MCH: 30.9 pg (ref 26.0–34.0)
MCHC: 33.9 g/dL (ref 30.0–36.0)
MCV: 91.2 fL (ref 78.0–100.0)
Monocytes Absolute: 0.9 10*3/uL (ref 0.1–1.0)
Monocytes Relative: 9 % (ref 3–12)
Neutro Abs: 7.8 10*3/uL — ABNORMAL HIGH (ref 1.7–7.7)
Neutrophils Relative %: 78 % — ABNORMAL HIGH (ref 43–77)
PLATELETS: 210 10*3/uL (ref 150–400)
RBC: 4.08 MIL/uL — ABNORMAL LOW (ref 4.22–5.81)
RDW: 12.7 % (ref 11.5–15.5)
WBC: 10 10*3/uL (ref 4.0–10.5)

## 2014-05-30 MED ORDER — OXYCODONE-ACETAMINOPHEN 5-325 MG PO TABS: 1.0000 | ORAL_TABLET | ORAL | Status: DC | PRN

## 2014-05-30 MED ORDER — OXYCODONE-ACETAMINOPHEN 5-325 MG PO TABS
1.0000 | ORAL_TABLET | Freq: Once | ORAL | Status: AC
  Administered 2014-05-30: 1 via ORAL
  Filled 2014-05-30: qty 1

## 2014-05-30 MED ORDER — CEPHALEXIN 500 MG PO CAPS
500.0000 mg | ORAL_CAPSULE | Freq: Four times a day (QID) | ORAL | Status: DC
Start: 2014-05-30 — End: 2014-08-22

## 2014-05-30 NOTE — ED Provider Notes (Addendum)
CSN: [id number]     Arrival date & time 05/30/14  0034 History   First MD Initiated Contact with Patient 05/30/14 [phone]     Chief Complaint  Patient presents with  . Back Pain  . Dysuria     (Consider location/radiation/quality/duration/timing/severity/associated sxs/prior Treatment) Patient is a 48 y.o. male presenting with back pain and dysuria. The history is provided by the patient.  Back Pain Associated symptoms: dysuria   Dysuria  He complains of bilateral flank pain for the last 2 days. Pain is worse on the left. States the pain is sharp and he rates it 8/10. Nothing makes it better nothing makes it worse. Pain subsided yesterday and then got worse today. He developed dysuria today. In addition the pain being present in the flanks, there is some radiation to the groin bilaterally. There is no associated nausea or vomiting and he denies fever chills or sweats. There's been no constipation or diarrhea. He is taking acetaminophen which does give slight relief of pain.  Past Medical History  Diagnosis Date  . Asthma   . Diabetes mellitus   . Hypertension    Past Surgical History  Procedure Laterality Date  . Ankle surgery     Family History  Problem Relation Age of Onset  . Diabetes type II Mother    History  Substance Use Topics  . Smoking status: Never Smoker   . Smokeless tobacco: Never Used  . Alcohol Use: No    Review of Systems  Genitourinary: Positive for dysuria.  Musculoskeletal: Positive for back pain.  All other systems reviewed and are negative.     Allergies  Review of patient's allergies indicates no known allergies.  Home Medications   Prior to Admission medications   Medication Sig Start Date End Date Taking? Authorizing Provider  budesonide-formoterol (SYMBICORT) 160-4.5 MCG/ACT inhaler Inhale 2 puffs into the lungs daily. 02/25/14  Yes Tammy S Parrett, NP  cycloSPORINE modified (NEORAL) 50 MG capsule Take 150 mg by mouth 2 (two) times daily.     Yes Historical Provider, MD  glipiZIDE (GLUCOTROL) 5 MG tablet Take 0.5 tablets (2.5 mg total) by mouth daily before breakfast. 01/26/14  Yes Charlynne Cousins, MD  lisinopril (PRINIVIL,ZESTRIL) 20 MG tablet Take 20 mg by mouth daily.   Yes Historical Provider, MD  loratadine (CLARITIN) 10 MG tablet Take 1 tablet (10 mg total) by mouth daily. 04/13/14  Yes Collene Gobble, MD  mometasone (NASONEX) 50 MCG/ACT nasal spray Place 2 sprays into the nose daily.   Yes Historical Provider, MD  pravastatin (PRAVACHOL) 20 MG tablet Take 20 mg by mouth daily.   Yes Historical Provider, MD  PROAIR HFA 108 (90 BASE) MCG/ACT inhaler Inhale 2 puffs into the lungs every 6 (six) hours as needed for wheezing. 01/10/14  Yes Collene Gobble, MD   BP 122/82  Pulse 94  Temp(Src) 99.1 F (37.3 C) (Oral)  Resp 14  Ht 5\' 5"  (1.651 m)  Wt 188 lb (85.276 kg)  BMI 31.28 kg/m2  SpO2 100% Physical Exam  Nursing note and vitals reviewed.  48 year old male, resting comfortably and in no acute distress. Vital signs are normal. Oxygen saturation is 100%, which is normal. Head is normocephalic and atraumatic. PERRLA, EOMI. Oropharynx is clear. Neck is nontender and supple without adenopathy or JVD. Back is nontender in the midline. There is moderate left CVA tenderness and mild right CVA tenderness. Lungs are clear without rales, wheezes, or rhonchi. Chest is nontender. Heart has  regular rate and rhythm without murmur. Abdomen is soft, flat, nontender without masses or hepatosplenomegaly and peristalsis is normoactive. Extremities have no cyanosis or edema, full range of motion is present. Skin is warm and dry without rash. Neurologic: Mental status is normal, cranial nerves are intact, there are no motor or sensory deficits.  ED Course  Procedures (including critical care time) Labs Review Results for orders placed during the hospital encounter of 05/30/14  URINALYSIS, ROUTINE W REFLEX MICROSCOPIC      Result Value  Ref Range   Color, Urine YELLOW  YELLOW   APPearance CLOUDY (*) CLEAR   Specific Gravity, Urine 1.017  1.005 - 1.030   pH 5.5  5.0 - 8.0   Glucose, UA 100 (*) NEGATIVE mg/dL   Hgb urine dipstick MODERATE (*) NEGATIVE   Bilirubin Urine NEGATIVE  NEGATIVE   Ketones, ur NEGATIVE  NEGATIVE mg/dL   Protein, ur >300 (*) NEGATIVE mg/dL   Urobilinogen, UA 1.0  0.0 - 1.0 mg/dL   Nitrite NEGATIVE  NEGATIVE   Leukocytes, UA SMALL (*) NEGATIVE  URINE MICROSCOPIC-ADD ON      Result Value Ref Range   Squamous Epithelial / LPF RARE  RARE   WBC, UA 11-20  <3 WBC/hpf   RBC / HPF 11-20  <3 RBC/hpf   Bacteria, UA RARE  RARE  CBC WITH DIFFERENTIAL      Result Value Ref Range   WBC 10.0  4.0 - 10.5 K/uL   RBC 4.08 (*) 4.22 - 5.81 MIL/uL   Hemoglobin 12.6 (*) 13.0 - 17.0 g/dL   HCT 37.2 (*) 39.0 - 52.0 %   MCV 91.2  78.0 - 100.0 fL   MCH 30.9  26.0 - 34.0 pg   MCHC 33.9  30.0 - 36.0 g/dL   RDW 12.7  11.5 - 15.5 %   Platelets 210  150 - 400 K/uL   Neutrophils Relative % 78 (*) 43 - 77 %   Neutro Abs 7.8 (*) 1.7 - 7.7 K/uL   Lymphocytes Relative 12  12 - 46 %   Lymphs Abs 1.2  0.7 - 4.0 K/uL   Monocytes Relative 9  3 - 12 %   Monocytes Absolute 0.9  0.1 - 1.0 K/uL   Eosinophils Relative 1  0 - 5 %   Eosinophils Absolute 0.1  0.0 - 0.7 K/uL   Basophils Relative 0  0 - 1 %   Basophils Absolute 0.0  0.0 - 0.1 K/uL  BASIC METABOLIC PANEL      Result Value Ref Range   Sodium 137  137 - 147 mEq/L   Potassium 5.8 (*) 3.7 - 5.3 mEq/L   Chloride 101  96 - 112 mEq/L   CO2 26  19 - 32 mEq/L   Glucose, Bld 206 (*) 70 - 99 mg/dL   BUN 27 (*) 6 - 23 mg/dL   Creatinine, Ser 2.61 (*) 0.50 - 1.35 mg/dL   Calcium 9.5  8.4 - 10.5 mg/dL   GFR calc non Af Amer 28 (*) >90 mL/min   GFR calc Af Amer 32 (*) >90 mL/min   Imaging Review Ct Abdomen Pelvis Wo Contrast  05/30/2014   CLINICAL DATA:  Back pain, hematuria. Status post left renal biopsy October 04, 2013, history of chronic kidney disease.  EXAM: CT  ABDOMEN AND PELVIS WITHOUT CONTRAST  TECHNIQUE: Multidetector CT imaging of the abdomen and pelvis was performed following the standard protocol without IV contrast.  COMPARISON:  Renal ultrasound October 04, 2013. CT of the abdomen and pelvis March 17, 2009  FINDINGS: Included view of the lung bases are clear. The visualized heart and pericardium are unremarkable.  Kidneys are orthotopic, demonstrating normal size and morphology. Mild to moderate bilateral hydroureteronephrosis. No urolithiasis. Limited assessment for renal masses on this nonenhanced examination. The unopacified ureters are normal in course and caliber. Urinary bladder is decompressed, with markedly urinary bladder wall thickening (up to 15 mm) even considering the degree of distension.  The liver, spleen, gallbladder, pancreas and adrenal glands are unremarkable for this non-contrast examination.  The stomach, small and large bowel are normal in course and caliber without inflammatory changes, the sensitivity may be decreased by lack of enteric contrast. Normal appendix. No intraperitoneal free fluid nor free air.  Aortoiliac vessels are normal in course and caliber. No lymphadenopathy by CT size criteria. Prostate is 5 cm in transaxial dimension. The soft tissues and included osseous structures are nonsuspicious. Small to moderate bilateral hydroceles.  IMPRESSION: Mild bilateral hydroureteronephrosis to the level of the urinary bladder with which demonstrates marked wall thickening even considering underdistention, this suggests chronic cystitis resulting in vesicoureteral reflux, consider cystoscopy and correlation with urinary analysis.  Mild prostatomegaly.   Electronically Signed   By: Elon Alas   On: 05/30/2014 03:24   MDM   Final diagnoses:  Urinary tract infection  Renal insufficiency    Flank pain and dysuria which are suspicious for possible UTI. However, urinalysis is significant mainly for hematuria. He has 11-20 WBCs  but also 11-20 RBCs and rare bacteria.Marland Kitchen He will be sent for CT scan to rule out ureterolithiasis. Old records are reviewed and he has been followed for renal insufficiency. Proteinuria is present. There are no prior urinalyses in our system, the proteinuria it would be expected with diabetic nephropathy.  CT and suggests chronic cystitis. Hyperkalemia is present but is secondary to hemolysis. There has been a slight drop in his renal function which will need to be followed closely. Urine culture has been sent and he is discharged with a prescription for cephalexin and oxycodone-acetaminophen for pain. He is referred to urology for evaluation of his chronic cystitis.  Delora Fuel, MD [id number]  Thaddus Mcdowell, MD [phone]

## 2014-05-30 NOTE — ED Notes (Signed)
Pt A&OX4, ambulatory at d/c with slow steady gait, NAD.

## 2014-05-30 NOTE — ED Notes (Signed)
Pt. reports low back pain with dysuria onset yesterday , denies injury / no hematuria , no fever or chills.

## 2014-05-30 NOTE — Discharge Instructions (Signed)
The CT scan suggests that you may have a chronic infection in the bladder and this may require additional testing to find out why. Please followup with the urologist for an opinion whether additional testing is needed. In the meantime, return to the emergency department he develop a high fever or if symptoms are not showing any sign of improving in the next 3 days.  Urinary Tract Infection Urinary tract infections (UTIs) can develop anywhere along your urinary tract. Your urinary tract is your body's drainage system for removing wastes and extra water. Your urinary tract includes two kidneys, two ureters, a bladder, and a urethra. Your kidneys are a pair of bean-shaped organs. Each kidney is about the size of your fist. They are located below your ribs, one on each side of your spine. CAUSES Infections are caused by microbes, which are microscopic organisms, including fungi, viruses, and bacteria. These organisms are so small that they can only be seen through a microscope. Bacteria are the microbes that most commonly cause UTIs. SYMPTOMS  Symptoms of UTIs may vary by age and gender of the patient and by the location of the infection. Symptoms in young women typically include a frequent and intense urge to urinate and a painful, burning feeling in the bladder or urethra during urination. Older women and men are more likely to be tired, shaky, and weak and have muscle aches and abdominal pain. A fever may mean the infection is in your kidneys. Other symptoms of a kidney infection include pain in your back or sides below the ribs, nausea, and vomiting. DIAGNOSIS To diagnose a UTI, your caregiver will ask you about your symptoms. Your caregiver also will ask to provide a urine sample. The urine sample will be tested for bacteria and white blood cells. White blood cells are made by your body to help fight infection. TREATMENT  Typically, UTIs can be treated with medication. Because most UTIs are caused by a  bacterial infection, they usually can be treated with the use of antibiotics. The choice of antibiotic and length of treatment depend on your symptoms and the type of bacteria causing your infection. HOME CARE INSTRUCTIONS  If you were prescribed antibiotics, take them exactly as your caregiver instructs you. Finish the medication even if you feel better after you have only taken some of the medication.  Drink enough water and fluids to keep your urine clear or pale yellow.  Avoid caffeine, tea, and carbonated beverages. They tend to irritate your bladder.  Empty your bladder often. Avoid holding urine for long periods of time.  Empty your bladder before and after sexual intercourse.  After a bowel movement, women should cleanse from front to back. Use each tissue only once. SEEK MEDICAL CARE IF:   You have back pain.  You develop a fever.  Your symptoms do not begin to resolve within 3 days. SEEK IMMEDIATE MEDICAL CARE IF:   You have severe back pain or lower abdominal pain.  You develop chills.  You have nausea or vomiting.  You have continued burning or discomfort with urination. MAKE SURE YOU:   Understand these instructions.  Will watch your condition.  Will get help right away if you are not doing well or get worse. Document Released: 09/18/2005 Document Revised: 06/09/2012 Document Reviewed: 01/17/2012 [facility] Patient Information 2014 [place].  Cephalexin tablets or capsules What is this medicine? CEPHALEXIN (sef a LEX in) is a cephalosporin antibiotic. It is used to treat certain kinds of bacterial infections It will  not work for colds, flu, or other viral infections. This medicine may be used for other purposes; ask your health care provider or pharmacist if you have questions. COMMON BRAND NAME(S): Biocef, Keflex, Keftab What should I tell my health care provider before I take this medicine? They need to know if you have any of these  conditions: -kidney disease -stomach or intestine problems, especially colitis -an unusual or allergic reaction to cephalexin, other cephalosporins, penicillins, other antibiotics, medicines, foods, dyes or preservatives -pregnant or trying to get pregnant -breast-feeding How should I use this medicine? Take this medicine by mouth with a full glass of water. Follow the directions on the prescription label. This medicine can be taken with or without food. Take your medicine at regular intervals. Do not take your medicine more often than directed. Take all of your medicine as directed even if you think you are better. Do not skip doses or stop your medicine early. Talk to your pediatrician regarding the use of this medicine in children. While this drug may be prescribed for selected conditions, precautions do apply. Overdosage: If you think you have taken too much of this medicine contact a poison control center or emergency room at once. NOTE: This medicine is only for you. Do not share this medicine with others. What if I miss a dose? If you miss a dose, take it as soon as you can. If it is almost time for your next dose, take only that dose. Do not take double or extra doses. There should be at least 4 to 6 hours between doses. What may interact with this medicine? -probenecid -some other antibiotics This list may not describe all possible interactions. Give your health care provider a list of all the medicines, herbs, non-prescription drugs, or dietary supplements you use. Also tell them if you smoke, drink alcohol, or use illegal drugs. Some items may interact with your medicine. What should I watch for while using this medicine? Tell your doctor or health care professional if your symptoms do not begin to improve in a few days. Do not treat diarrhea with over the counter products. Contact your doctor if you have diarrhea that lasts more than 2 days or if it is severe and watery. If you have  diabetes, you may get a false-positive result for sugar in your urine. Check with your doctor or health care professional. What side effects may I notice from receiving this medicine? Side effects that you should report to your doctor or health care professional as soon as possible: -allergic reactions like skin rash, itching or hives, swelling of the face, lips, or tongue -breathing problems -pain or trouble passing urine -redness, blistering, peeling or loosening of the skin, including inside the mouth -severe or watery diarrhea -unusually weak or tired -yellowing of the eyes, skin Side effects that usually do not require medical attention (report to your doctor or health care professional if they continue or are bothersome): -gas or heartburn -genital or anal irritation -headache -joint or muscle pain -nausea, vomiting This list may not describe all possible side effects. Call your doctor for medical advice about side effects. You may report side effects to FDA at 1-800-FDA-1088. Where should I keep my medicine? Keep out of the reach of children. Store at room temperature between 59 and 86 degrees F (15 and 30 degrees C). Throw away any unused medicine after the expiration date. NOTE: This sheet is a summary. It may not cover all possible information. If you have questions  about this medicine, talk to your doctor, pharmacist, or health care provider.  2014, Elsevier/Gold Standard. (2008-03-14 17:09:13)  Acetaminophen; Oxycodone tablets What is this medicine? ACETAMINOPHEN; OXYCODONE (a set a MEE noe fen; ox i KOE done) is a pain reliever. It is used to treat mild to moderate pain. This medicine may be used for other purposes; ask your health care provider or pharmacist if you have questions. COMMON BRAND NAME(S): Endocet, Magnacet, Narvox, Percocet, Perloxx, Primalev, Primlev, Roxicet, Xolox What should I tell my health care provider before I take this medicine? They need to know if  you have any of these conditions: -brain tumor -Crohn's disease, inflammatory bowel disease, or ulcerative colitis -drug abuse or addiction -head injury -heart or circulation problems -if you often drink alcohol -kidney disease or problems going to the bathroom -liver disease -lung disease, asthma, or breathing problems -an unusual or allergic reaction to acetaminophen, oxycodone, other opioid analgesics, other medicines, foods, dyes, or preservatives -pregnant or trying to get pregnant -breast-feeding How should I use this medicine? Take this medicine by mouth with a full glass of water. Follow the directions on the prescription label. Take your medicine at regular intervals. Do not take your medicine more often than directed. Talk to your pediatrician regarding the use of this medicine in children. Special care may be needed. Patients over 9 years old may have a stronger reaction and need a smaller dose. Overdosage: If you think you have taken too much of this medicine contact a poison control center or emergency room at once. NOTE: This medicine is only for you. Do not share this medicine with others. What if I miss a dose? If you miss a dose, take it as soon as you can. If it is almost time for your next dose, take only that dose. Do not take double or extra doses. What may interact with this medicine? -alcohol -antihistamines -barbiturates like amobarbital, butalbital, butabarbital, methohexital, pentobarbital, phenobarbital, thiopental, and secobarbital -benztropine -drugs for bladder problems like solifenacin, trospium, oxybutynin, tolterodine, hyoscyamine, and methscopolamine -drugs for breathing problems like ipratropium and tiotropium -drugs for certain stomach or intestine problems like propantheline, homatropine methylbromide, glycopyrrolate, atropine, belladonna, and dicyclomine -general anesthetics like etomidate, ketamine, nitrous oxide, propofol, desflurane, enflurane,  halothane, isoflurane, and sevoflurane -medicines for depression, anxiety, or psychotic disturbances -medicines for sleep -muscle relaxants -naltrexone -narcotic medicines (opiates) for pain -phenothiazines like perphenazine, thioridazine, chlorpromazine, mesoridazine, fluphenazine, prochlorperazine, promazine, and trifluoperazine -scopolamine -tramadol -trihexyphenidyl This list may not describe all possible interactions. Give your health care provider a list of all the medicines, herbs, non-prescription drugs, or dietary supplements you use. Also tell them if you smoke, drink alcohol, or use illegal drugs. Some items may interact with your medicine. What should I watch for while using this medicine? Tell your doctor or health care professional if your pain does not go away, if it gets worse, or if you have new or a different type of pain. You may develop tolerance to the medicine. Tolerance means that you will need a higher dose of the medication for pain relief. Tolerance is normal and is expected if you take this medicine for a long time. Do not suddenly stop taking your medicine because you may develop a severe reaction. Your body becomes used to the medicine. This does NOT mean you are addicted. Addiction is a behavior related to getting and using a drug for a non-medical reason. If you have pain, you have a medical reason to take pain medicine. Your doctor will  tell you how much medicine to take. If your doctor wants you to stop the medicine, the dose will be slowly lowered over time to avoid any side effects. You may get drowsy or dizzy. Do not drive, use machinery, or do anything that needs mental alertness until you know how this medicine affects you. Do not stand or sit up quickly, especially if you are an older patient. This reduces the risk of dizzy or fainting spells. Alcohol may interfere with the effect of this medicine. Avoid alcoholic drinks. There are different types of narcotic  medicines (opiates) for pain. If you take more than one type at the same time, you may have more side effects. Give your health care provider a list of all medicines you use. Your doctor will tell you how much medicine to take. Do not take more medicine than directed. Call emergency for help if you have problems breathing. The medicine will cause constipation. Try to have a bowel movement at least every 2 to 3 days. If you do not have a bowel movement for 3 days, call your doctor or health care professional. Do not take Tylenol (acetaminophen) or medicines that have acetaminophen with this medicine. Too much acetaminophen can be very dangerous. Many nonprescription medicines contain acetaminophen. Always read the labels carefully to avoid taking more acetaminophen. What side effects may I notice from receiving this medicine? Side effects that you should report to your doctor or health care professional as soon as possible: -allergic reactions like skin rash, itching or hives, swelling of the face, lips, or tongue -breathing difficulties, wheezing -confusion -light headedness or fainting spells -severe stomach pain -unusually weak or tired -yellowing of the skin or the whites of the eyes  Side effects that usually do not require medical attention (report to your doctor or health care professional if they continue or are bothersome): -dizziness -drowsiness -nausea -vomiting This list may not describe all possible side effects. Call your doctor for medical advice about side effects. You may report side effects to FDA at 1-800-FDA-1088. Where should I keep my medicine? Keep out of the reach of children. This medicine can be abused. Keep your medicine in a safe place to protect it from theft. Do not share this medicine with anyone. Selling or giving away this medicine is dangerous and against the law. Store at room temperature between 20 and 25 degrees C (68 and 77 degrees F). Keep container tightly  closed. Protect from light. This medicine may cause accidental overdose and death if it is taken by other adults, children, or pets. Flush any unused medicine down the toilet to reduce the chance of harm. Do not use the medicine after the expiration date. NOTE: This sheet is a summary. It may not cover all possible information. If you have questions about this medicine, talk to your doctor, pharmacist, or health care provider.  2014, Elsevier/Gold Standard. (2013-08-02 13:17:35)

## 2014-06-01 LAB — URINE CULTURE: Colony Count: >=100000

## 2014-06-05 ENCOUNTER — Telehealth (HOSPITAL_BASED_OUTPATIENT_CLINIC_OR_DEPARTMENT_OTHER): Payer: Self-pay | Admitting: Emergency Medicine

## 2014-06-05 NOTE — Telephone Encounter (Signed)
Per pharmacist, patient treated with Keflex. Sensitive to same. 

## 2014-08-22 ENCOUNTER — Encounter (HOSPITAL_COMMUNITY): Payer: Self-pay | Admitting: Emergency Medicine

## 2014-08-22 ENCOUNTER — Emergency Department (HOSPITAL_COMMUNITY)
Admission: EM | Admit: 2014-08-22 | Discharge: 2014-08-23 | Disposition: A | Discharge status: Home / Self Care | Payer: Medicaid Other | Attending: Emergency Medicine | Admitting: Emergency Medicine

## 2014-08-22 DIAGNOSIS — E119 Type 2 diabetes mellitus without complications: Secondary | ICD-10-CM | POA: Insufficient documentation

## 2014-08-22 DIAGNOSIS — I10 Essential (primary) hypertension: Secondary | ICD-10-CM | POA: Insufficient documentation

## 2014-08-22 DIAGNOSIS — M25579 Pain in unspecified ankle and joints of unspecified foot: Secondary | ICD-10-CM | POA: Insufficient documentation

## 2014-08-22 DIAGNOSIS — Z79899 Other long term (current) drug therapy: Secondary | ICD-10-CM | POA: Insufficient documentation

## 2014-08-22 DIAGNOSIS — M25571 Pain in right ankle and joints of right foot: Secondary | ICD-10-CM

## 2014-08-22 DIAGNOSIS — J45909 Unspecified asthma, uncomplicated: Secondary | ICD-10-CM | POA: Diagnosis not present

## 2014-08-22 MED ORDER — TRAMADOL HCL 50 MG PO TABS
50.0000 mg | ORAL_TABLET | Freq: Once | ORAL | Status: AC
  Administered 2014-08-22: 50 mg via ORAL
  Filled 2014-08-22: qty 1

## 2014-08-22 NOTE — ED Provider Notes (Signed)
CSN: [id number]     Arrival date & time 08/22/14  2330 History  This chart was scribed for non-physician practitioner, Pura Spice, PA-C working with Kalman Drape, MD by Frederich Balding, ED scribe. This patient was seen in room TR11C/TR11C and the patient's care was started at 11:44 PM.   Chief Complaint  Patient presents with  . Ankle Pain   The history is provided by the patient. No language interpreter was used.   HPI Comments: Walter Harmon is a 48 y.o. male with history of kidney disease who presents to the Emergency Department complaining of sudden onset right ankle pain with associated mild swelling that started yesterday. Rates pain 8/10. Pt thinks he stepped the wrong way yesterday before the pain started. Denies fall. Bearing weight worsens the pain. Pt has taken Advil yesterday with no relief. He is unsure if he was ever told not to take NSAIDs. Pt has past right ankle surgery where plates and screws were placed.  Past Medical History  Diagnosis Date  . Asthma   . Diabetes mellitus   . Hypertension    Past Surgical History  Procedure Laterality Date  . Ankle surgery     Family History  Problem Relation Age of Onset  . Diabetes type II Mother    History  Substance Use Topics  . Smoking status: Never Smoker   . Smokeless tobacco: Never Used  . Alcohol Use: No    Review of Systems  Musculoskeletal: Positive for arthralgias and joint swelling.  All other systems reviewed and are negative.  Allergies  Review of patient's allergies indicates no known allergies.  Home Medications   Prior to Admission medications   Medication Sig Start Date End Date Taking? Authorizing Provider  budesonide-formoterol (SYMBICORT) 160-4.5 MCG/ACT inhaler Inhale 2 puffs into the lungs daily. 02/25/14  Yes Tammy S Parrett, NP  cycloSPORINE modified (NEORAL) 50 MG capsule Take 150 mg by mouth 2 (two) times daily.    Yes Historical Provider, MD  glipiZIDE (GLUCOTROL) 5 MG tablet Take  0.5 tablets (2.5 mg total) by mouth daily before breakfast. 01/26/14  Yes Charlynne Cousins, MD  ibuprofen (ADVIL,MOTRIN) 200 MG tablet Take 200 mg by mouth every 6 (six) hours as needed for mild pain or moderate pain.   Yes Historical Provider, MD  lisinopril (PRINIVIL,ZESTRIL) 20 MG tablet Take 20 mg by mouth daily.   Yes Historical Provider, MD  pravastatin (PRAVACHOL) 20 MG tablet Take 20 mg by mouth daily.   Yes Historical Provider, MD   BP 133/95  Pulse 97  Temp(Src) 99.4 F (37.4 C) (Oral)  Resp 12  Ht 5\' 5"  (1.651 m)  Wt 188 lb (85.276 kg)  BMI 31.28 kg/m2  SpO2 97%  Physical Exam  Nursing note and vitals reviewed. Constitutional: He is oriented to person, place, and time. He appears well-developed and well-nourished. No distress.  HENT:  Head: Normocephalic and atraumatic.  Eyes: Conjunctivae and EOM are normal.  Neck: Neck supple. No tracheal deviation present.  Cardiovascular: Normal rate.   DP pulses intact bilaterally. Capillary refill less than 3 seconds.  Pulmonary/Chest: Effort normal. No respiratory distress.  Musculoskeletal: Normal range of motion.  Strength 5/5 in bilateral lower extremities. Mild swelling to lateral aspect of right ankle below lateral malleolus. No redness. No warmth. Achilles tendon intact. Negative Thompson's sign.  Neurological: He is alert and oriented to person, place, and time.  Skin: Skin is warm and dry.  Psychiatric: He has a normal mood and  affect. His behavior is normal.    ED Course  Procedures (including critical care time)  DIAGNOSTIC STUDIES: Oxygen Saturation is 100% on RA, normal by my interpretation.    COORDINATION OF CARE: 11:49 PM-Discussed treatment plan which includes xray with pt at bedside and pt agreed to plan.   Labs Review Labs Reviewed - No data to display  Imaging Review Dg Ankle Complete Right  08/23/2014   CLINICAL DATA:  ANKLE PAIN  EXAM: RIGHT ANKLE - COMPLETE 3+ VIEW  COMPARISON:  04/24/2009   FINDINGS: Solid-appearing plate and screw fixation of the distal fibula with fusion across the distal syndesmosis with the tibia. Ankle mortise is intact. There are cystic subchondral lucencies in the distal tibia probably degenerative. Corticated ossicle inferior to the medial malleolus. Calcaneal spur. No acute fracture or dislocation.  IMPRESSION: 1. Negative for fracture or other acute bone abnormality. 2. Postoperative and degenerative changes as above.   Electronically Signed   By: Arne Cleveland M.D.   On: 08/23/2014 00:49     EKG Interpretation None     Meds given in ED:  Medications  traMADol (ULTRAM) tablet 50 mg (50 mg Oral Given 08/22/14 2358)    Discharge Medication List as of 08/23/2014  1:03 AM        MDM   Final diagnoses:  Right ankle pain   Jarid A Bradford is a 48 y.o. male presents with one day history of right ankle pain where he came down on it wrong. Patient denies injury or fall and pt is ambulatory with pain. VSS. Neurovascularly intact. Patient with mild swelling to lateral ankle without erythema, warmth or fevers. I doubt septic arthritis. Patient given tramadol in ED with improvement of symptoms. Xray without acute fracture or dislocation. Patient given ASO, crutches for comfort. Advance to weightbearing as tolerated. Tx with tylenol, RICE. Follow up with orthopedist if symptoms are persistent.  Discussed return precautions with patient. Discussed all results and patient verbalizes understanding and agrees with plan.  I personally performed the services described in this documentation, which was scribed in my presence. The recorded information has been reviewed and is accurate.  Pura Spice, PA-C 08/23/14 1144

## 2014-08-22 NOTE — ED Notes (Signed)
Pt. reports right ankle pain onset 2 days ago , denies recent injury or fall / ambulatory.

## 2014-08-23 ENCOUNTER — Emergency Department (HOSPITAL_COMMUNITY): Payer: Medicaid Other

## 2014-08-23 NOTE — Discharge Instructions (Signed)
Return to the emergency room with worsening of symptoms, new symptoms or with symptoms that are concerning, especially warmth, swelling, redness, or fevers. RICE: Rest, Ice (three cycles of 20 mins on, 58mins off at least twice a day), compression/brace, elevation.  ASO (ankle brace) and crutches for comfort. Advance to walking as tolerated. Tylenol 1-2 tablets of 500mg  every 6 hours for 3-5 days and then as needed for pain. Do not take more than 3000mg  of tylenol in one day. Do not take NSAIDs (ibuprofen or aleve) due to your chronic kidney disease. Follow up with orthopedist if symptoms worsen or are persistent. Please call your doctor for a followup appointment within 24-48 hours. When you talk to your doctor please let them know that you were seen in the emergency department and have them acquire all of your records so that they can discuss the findings with you and formulate a treatment plan to fully care for your new and ongoing problems.    Ankle Pain Ankle pain is a common symptom. The bones, cartilage, tendons, and muscles of the ankle joint perform a lot of work each day. The ankle joint holds your body weight and allows you to move around. Ankle pain can occur on either side or back of 1 or both ankles. Ankle pain may be sharp and burning or dull and aching. There may be tenderness, stiffness, redness, or warmth around the ankle. The pain occurs more often when a person walks or puts pressure on the ankle. CAUSES  There are many reasons ankle pain can develop. It is important to work with your caregiver to identify the cause since many conditions can impact the bones, cartilage, muscles, and tendons. Causes for ankle pain include:  Injury, including a break (fracture), sprain, or strain often due to a fall, sports, or a high-impact activity.  Swelling (inflammation) of a tendon (tendonitis).  Achilles tendon rupture.  Ankle instability after repeated sprains and strains.  Poor foot  alignment.  Pressure on a nerve (tarsal tunnel syndrome).  Arthritis in the ankle or the lining of the ankle.  Crystal formation in the ankle (gout or pseudogout). DIAGNOSIS  A diagnosis is based on your medical history, your symptoms, results of your physical exam, and results of diagnostic tests. Diagnostic tests may include X-ray exams or a computerized magnetic scan (magnetic resonance imaging, MRI). TREATMENT  Treatment will depend on the cause of your ankle pain and may include:  Keeping pressure off the ankle and limiting activities.  Using crutches or other walking support (a cane or brace).  Using rest, ice, compression, and elevation.  Participating in physical therapy or home exercises.  Wearing shoe inserts or special shoes.  Losing weight.  Taking medications to reduce pain or swelling or receiving an injection.  Undergoing surgery. HOME CARE INSTRUCTIONS   Only take over-the-counter or prescription medicines for pain, discomfort, or fever as directed by your caregiver.  Put ice on the injured area.  Put ice in a plastic bag.  Place a towel between your skin and the bag.  Leave the ice on for 15-20 minutes at a time, 03-04 times a day.  Keep your leg raised (elevated) when possible to lessen swelling.  Avoid activities that cause ankle pain.  Follow specific exercises as directed by your caregiver.  Record how often you have ankle pain, the location of the pain, and what it feels like. This information may be helpful to you and your caregiver.  Ask your caregiver about returning to  work or sports and whether you should drive.  Follow up with your caregiver for further examination, therapy, or testing as directed. SEEK MEDICAL CARE IF:   Pain or swelling continues or worsens beyond 1 week.  You have an oral temperature above 102 F (38.9 C).  You are feeling unwell or have chills.  You are having an increasingly difficult time with  walking.  You have loss of sensation or other new symptoms.  You have questions or concerns. MAKE SURE YOU:   Understand these instructions.  Will watch your condition.  Will get help right away if you are not doing well or get worse. Document Released: 05/29/2010 Document Revised: 03/02/2012 Document Reviewed: 05/29/2010 [facility] Patient Information 2015 [place], [place]. This information is not intended to replace advice given to you by your health care provider. Make sure you discuss any questions you have with your health care provider.

## 2014-08-24 NOTE — ED Provider Notes (Signed)
Medical screening examination/treatment/procedure(s) were performed by non-physician practitioner and as supervising physician I was immediately available for consultation/collaboration.   EKG Interpretation None       Kalman Drape, MD 08/24/14 0430

## 2014-12-13 ENCOUNTER — Encounter: Payer: Self-pay | Admitting: Adult Health

## 2014-12-13 ENCOUNTER — Ambulatory Visit (INDEPENDENT_AMBULATORY_CARE_PROVIDER_SITE_OTHER): Payer: Medicaid Other | Admitting: Adult Health

## 2014-12-13 VITALS — BP 134/82 | HR 76 | Temp 98.2°F | Ht 65.0 in | Wt 184.8 lb

## 2014-12-13 DIAGNOSIS — J4531 Mild persistent asthma with (acute) exacerbation: Secondary | ICD-10-CM

## 2014-12-13 NOTE — Progress Notes (Signed)
   Subjective:    Patient ID: Walter Harmon, male    DOB: [birth year], 48 y.o.   MRN: [id number]  HPI 48 yo never smoker, referred for asthma evaluation on 09/09/13 with Dr. Lamonte Sakai   Patient has a history of diabetes and chronic kidney disease. He is followed by nephrology.  09/09/13 IOV   hx DM and asthma dx as a child. Also with rhinitis.  He has been on controller medications since '99, currently Symbicort. Has albuterol available to use prn. Unfortunately has been out of his meds for ~ 2 months. Flares infrequently, but has had more trouble when he runs out of meds. Was started on lisinopril 2 weeks ago, seems to be tolerating. Had spiro w Dr Donneta Romberg in 2011 - 12. Allergies are poorly controlled right now. Used to be on astelin and nasal steroid.  >restart Symbicort   02/25/14 [facility] follow up  Patient returns for a post hospital followup. Patient was admitted February 1 through February 4 for an acute asthma exacerbation with acute hypoxic respiratory failure. Chest x-ray without acute process noted. Patient was treated with IV antibiotics, and steroids. His virus panel was positive for rotavirus. He was discharged on a steroid taper and antibiotics. Patient says that he is much improved. He denies any increased wheezing, or cough. Patient has not been taken his Symbicort on a regular basis. Of note, patient is on an ACE inhibitor with lisinopril. Patient denies any fever or hemoptysis, orthopnea, PND, or leg swelling.  ROV 04/13/13 -- follows for asthma, allergic rhinitis. He is having more trouble lately with cough and mucous. He is on nasonex, has taken reliably.   12/13/2014 Acute OV  Complains of increased chest congestion, some head congestion with yellow/green mucus, PND, bilateral ear congestion, some wheezing/tightness, increased SOB x1 week.   Denies f/c/s, n/v/d, hemoptysis, chest pain , orthopnea or edema.  Good appetite .  Not taking otc for tx .    Review of  Systems As per HPI     Objective:   Physical Exam   GEN: A/Ox3; pleasant , NAD, well nourished   HEENT:  [place]/AT,  EACs-clear, TMs-wnl, NOSE-clear drainage ,  THROAT-clear, no lesions, no postnasal drip or exudate noted.   NECK:  Supple w/ fair ROM; no JVD; normal carotid impulses w/o bruits; no thyromegaly or nodules palpated; no lymphadenopathy.  RESP  Clear  P & A; w/o, wheezes/ rales/ or rhonchi.no accessory muscle use  CARD:  RRR, no m/r/g  , no peripheral edema, pulses intact, no cyanosis or clubbing.  Musco: Warm bil, no deformities or joint swelling noted.   Neuro: alert, no focal deficits noted.    Skin: Warm, no lesions or rashes         Assessment & Plan:

## 2014-12-13 NOTE — Assessment & Plan Note (Signed)
Mild flare with URI  xopenex neb x 1   Plan  ZPack take as directed.  Mucinex DM Twice daily  As needed  Cough/congestion  Saline nasal rinses As needed   Fluids and rest  Please contact office for sooner follow up if symptoms do not improve or worsen or seek emergency care  Follow up Dr. Lamonte Sakai  As planned and As needed

## 2014-12-13 NOTE — Patient Instructions (Signed)
ZPack take as directed.  Mucinex DM Twice daily  As needed  Cough/congestion  Saline nasal rinses As needed   Fluids and rest  Please contact office for sooner follow up if symptoms do not improve or worsen or seek emergency care  Follow up Dr. Lamonte Sakai  As planned and As needed

## 2015-01-31 ENCOUNTER — Encounter (INDEPENDENT_AMBULATORY_CARE_PROVIDER_SITE_OTHER): Payer: Self-pay

## 2015-01-31 ENCOUNTER — Ambulatory Visit (INDEPENDENT_AMBULATORY_CARE_PROVIDER_SITE_OTHER): Payer: Medicaid Other | Admitting: Emergency Medicine

## 2015-01-31 ENCOUNTER — Encounter: Payer: Self-pay | Admitting: Emergency Medicine

## 2015-01-31 VITALS — BP 108/78 | HR 84 | Ht 65.0 in | Wt 181.0 lb

## 2015-01-31 DIAGNOSIS — J4520 Mild intermittent asthma, uncomplicated: Secondary | ICD-10-CM

## 2015-01-31 MED ORDER — AZITHROMYCIN 250 MG PO TABS
ORAL_TABLET | ORAL | Status: AC
Start: 2015-01-31 — End: 2015-02-05

## 2015-01-31 NOTE — Assessment & Plan Note (Signed)
Stable and doing well. At some risk for exacerbation given moderately controlled allergic rhinitis.  -  Continue current medications Advised him to go back on his allergy routine if he starts to develop symptoms A prescription for azithromycin was given for him to fill in the event that he develops bronchitis symptoms. These were reviewed with him. I asked him to call us if he has to start this medication

## 2015-01-31 NOTE — Patient Instructions (Signed)
Please continue your current medications as you have been taking them  We will write you an azithromycin prescription to have available in case you develop more mucous, coloed mucous, more wheezing. CALL OUR OFFICE if you have to start this medicine so we are aware.  Follow with Dr Lamonte Sakai in 6 months or sooner if you have any problems

## 2015-01-31 NOTE — Progress Notes (Signed)
Subjective:    Patient ID: Walter Harmon, male    DOB: [birth year], 49 y.o.   MRN: [id number]  HPI 49 yo never smoker, referred for asthma evaluation on 09/09/13 with Dr. Lamonte Sakai   Patient has a history of diabetes and chronic kidney disease. He is followed by nephrology.  09/09/13 IOV   hx DM and asthma dx as a child. Also with rhinitis.  He has been on controller medications since '99, currently Symbicort. Has albuterol available to use prn. Unfortunately has been out of his meds for ~ 2 months. Flares infrequently, but has had more trouble when he runs out of meds. Was started on lisinopril 2 weeks ago, seems to be tolerating. Had spiro w Dr Donneta Romberg in 2011 - 12. Allergies are poorly controlled right now. Used to be on astelin and nasal steroid.  >restart Symbicort   02/25/14 [facility] follow up  Patient returns for a post hospital followup. Patient was admitted February 1 through February 4 for an acute asthma exacerbation with acute hypoxic respiratory failure. Chest x-ray without acute process noted. Patient was treated with IV antibiotics, and steroids. His virus panel was positive for rotavirus. He was discharged on a steroid taper and antibiotics. Patient says that he is much improved. He denies any increased wheezing, or cough. Patient has not been taken his Symbicort on a regular basis. Of note, patient is on an ACE inhibitor with lisinopril. Patient denies any fever or hemoptysis, orthopnea, PND, or leg swelling.  ROV 04/13/13 -- follows for asthma, allergic rhinitis. He is having more trouble lately with cough and mucous. He is on nasonex, has taken reliably.    Acute OV 12/13/14 Complains of increased chest congestion, some head congestion with yellow/green mucus, PND, bilateral ear congestion, some wheezing/tightness, increased SOB x1 week.   Denies f/c/s, n/v/d, hemoptysis, chest pain , orthopnea or edema.  Good appetite .  Not taking otc for tx .   ROV 01/31/15 -- follow  up visit asthma and chronic rhinitis. He also has a history of chronic kidney disease and diabetes. He was treated with azithromycin, Mucinex, saline nasal rinses in 12/13/14 for bronchitis. He has been doing fairly well, is using his albuterol about every 3 days.  He is on symbicort qd instead of bid and does well with this.  His nasal congestion is stable.    Review of Systems As per HPI     Objective:   Physical Exam Filed Vitals:   01/31/15 1526  BP: 108/78  Pulse: 84  Height: 5\' 5"  (1.651 m)  Weight: 181 lb (82.101 kg)  SpO2: 97%    GEN: A/Ox3; pleasant , NAD, well nourished   HEENT: OP clear, a bit of nasal congestion.   NECK:  No stridor, no lesions,   RESP  Clear  P & A; w/o wheezes/ rales/ or rhonchi.no accessory muscle use  CARD:  RRR, no m/r/g  , no peripheral edema, pulses intact, no cyanosis or clubbing.  Musco: Warm bil, no deformities or joint swelling noted.   Neuro: alert, no focal deficits noted.    Skin: Warm, no lesions or rashes      Assessment & Plan:  Asthma Stable and doing well. At some risk for exacerbation given moderately controlled allergic rhinitis.  -  Continue current medications Advised him to go back on his allergy routine if he starts to develop symptoms A prescription for azithromycin was given for him to fill in the event that he develops bronchitis  symptoms. These were reviewed with him. I asked him to call us if he has to start this medication

## 2015-03-09 ENCOUNTER — Telehealth: Payer: Self-pay | Admitting: Emergency Medicine

## 2015-03-09 MED ORDER — AZITHROMYCIN 250 MG PO TABS: ORAL_TABLET | ORAL | Status: DC

## 2015-03-09 MED ORDER — BUDESONIDE-FORMOTEROL FUMARATE 160-4.5 MCG/ACT IN AERO: 2.0000 | INHALATION_SPRAY | Freq: Every day | RESPIRATORY_TRACT | Status: DC

## 2015-03-09 NOTE — Telephone Encounter (Signed)
Pt has been scheduled to see TP tomorrow at 2:45pm. Nothing further was needed.

## 2015-03-09 NOTE — Telephone Encounter (Signed)
That visit was a month ago - need to find out whether he is having same / similar sx; ie does he need to be seen, does he need pred?

## 2015-03-09 NOTE — Telephone Encounter (Signed)
FYI for Dr Lamonte Sakai:   Pt c/o dry cough, nasal congestion, some sob and wheezing.  Pt was told at last ov to fill rx for Zpak if symptoms start then let our office know.  Pt did not receive Zpak rx at last ov.  Rx sent to pharmacy.  Pt instructed to call our office if symptoms do not improve.

## 2015-03-10 ENCOUNTER — Ambulatory Visit (INDEPENDENT_AMBULATORY_CARE_PROVIDER_SITE_OTHER): Payer: Medicaid Other | Admitting: Adult Health

## 2015-03-10 ENCOUNTER — Encounter: Payer: Self-pay | Admitting: Adult Health

## 2015-03-10 VITALS — BP 126/82 | HR 86 | Temp 98.7°F | Ht 65.0 in | Wt 177.6 lb

## 2015-03-10 DIAGNOSIS — J4520 Mild intermittent asthma, uncomplicated: Secondary | ICD-10-CM

## 2015-03-10 NOTE — Patient Instructions (Signed)
Begin Zyrtec 10mg  At bedtime  For 1 week then As needed   Saline nasal rinses As needed   Flonase otc 2 puffs daily in am .  Mucinex DM Twice daily  As needed  Cough/congestion  Please contact office for sooner follow up if symptoms do not improve or worsen or seek emergency care  Discuss with family doctor that Lisinopril may be aggravating your cough /asthma.

## 2015-03-10 NOTE — Assessment & Plan Note (Signed)
Mild flare with AR  Hold on steroids as no wheezing on exam  Control tirggers Suspect would be better off ACE as frquent flares   Plan  begin Zyrtec 10mg  At bedtime  For 1 week then As needed   Saline nasal rinses As needed   Flonase otc 2 puffs daily in am .  Mucinex DM Twice daily  As needed  Cough/congestion  Please contact office for sooner follow up if symptoms do not improve or worsen or seek emergency care  Discuss with family doctor that Lisinopril may be aggravating your cough /asthma.

## 2015-03-10 NOTE — Progress Notes (Signed)
   Subjective:    Patient ID: Walter Harmon, male    DOB: 1966/06/30, 49 y.o.   MRN: [id number]  HPI 49 yo never smoker, referred for asthma evaluation on 09/09/13 with Dr. Lamonte Sakai   Patient has a history of diabetes and chronic kidney dz   03/10/2015 Acute OV : Asthma and AR  Complains head congestion, sneezing, runny nose x1 day,  prod cough with clear mucus x2 days.  Denies PND, purulent sputum, f/c/s, n/v/d, hemoptyis No otc used.  No wheezing or discolored mucus.  On ACE inhibitor with frequent URI /asthma flares   Review of Systems As per HPI     Objective:   Physical Exam   GEN: A/Ox3; pleasant , NAD, well nourished   HEENT:  [place]/AT,  EACs-clear, TMs-wnl, NOSE-clear drainage ,  THROAT-clear drainage , no lesions, no postnasal drip or exudate noted.   NECK:  Supple w/ fair ROM; no JVD; normal carotid impulses w/o bruits; no thyromegaly or nodules palpated; no lymphadenopathy.  RESP  Clear  P & A; w/o, wheezes/ rales/ or rhonchi.no accessory muscle use  CARD:  RRR, no m/r/g  , no peripheral edema, pulses intact, no cyanosis or clubbing.  Musco: Warm bil, no deformities or joint swelling noted.   Neuro: alert, no focal deficits noted.    Skin: Warm, no lesions or rashes         Assessment & Plan:

## 2015-04-12 ENCOUNTER — Other Ambulatory Visit: Payer: Self-pay | Admitting: Emergency Medicine

## 2015-08-17 ENCOUNTER — Ambulatory Visit
Admission: RE | Admit: 2015-08-17 | Discharge: 2015-08-17 | Disposition: A | Discharge status: Home / Self Care | Payer: Medicaid Other | Source: Ambulatory Visit | Attending: Family Medicine | Admitting: Family Medicine

## 2015-08-17 ENCOUNTER — Other Ambulatory Visit: Payer: Self-pay | Admitting: Family Medicine

## 2015-08-17 DIAGNOSIS — M79672 Pain in left foot: Secondary | ICD-10-CM

## 2015-09-15 ENCOUNTER — Ambulatory Visit: Payer: Medicaid Other | Admitting: Emergency Medicine

## 2015-09-25 ENCOUNTER — Telehealth: Payer: Self-pay | Admitting: *Deleted

## 2015-09-25 ENCOUNTER — Encounter: Payer: Self-pay | Admitting: Podiatry

## 2015-09-25 NOTE — Telephone Encounter (Signed)
Pt called at 1251pm, to see when his appt was today.  I informed pt his appt had been at 1030am today and transferred him to schedulers.

## 2015-09-29 NOTE — Progress Notes (Signed)
Patient ID: Walter Harmon, male   DOB: October 30, 1966, 49 y.o.   MRN: [id number]  No show

## 2015-10-10 ENCOUNTER — Encounter: Payer: Self-pay | Admitting: Emergency Medicine

## 2015-10-10 ENCOUNTER — Ambulatory Visit (INDEPENDENT_AMBULATORY_CARE_PROVIDER_SITE_OTHER): Payer: Medicaid Other | Admitting: Emergency Medicine

## 2015-10-10 VITALS — BP 110/78 | HR 85 | Ht 65.0 in | Wt 181.0 lb

## 2015-10-10 DIAGNOSIS — J4520 Mild intermittent asthma, uncomplicated: Secondary | ICD-10-CM | POA: Diagnosis not present

## 2015-10-10 DIAGNOSIS — Z23 Encounter for immunization: Secondary | ICD-10-CM | POA: Diagnosis not present

## 2015-10-10 NOTE — Progress Notes (Signed)
Subjective:    Patient ID: Walter Harmon, male    DOB: 24-Jun-1966, 49 y.o.   MRN: [id number]  HPI 49 yo never smoker, referred for asthma evaluation on 09/09/13 with Dr. Lamonte Sakai   Patient has a history of diabetes and chronic kidney disease. He is followed by nephrology.  09/09/13 IOV   hx DM and asthma dx as a child. Also with rhinitis.  He has been on controller medications since '99, currently Symbicort. Has albuterol available to use prn. Unfortunately has been out of his meds for ~ 2 months. Flares infrequently, but has had more trouble when he runs out of meds. Was started on lisinopril 2 weeks ago, seems to be tolerating. Had spiro w Dr Donneta Romberg in 2011 - 12. Allergies are poorly controlled right now. Used to be on astelin and nasal steroid.  >restart Symbicort   02/25/14 [facility] follow up  Patient returns for a post hospital followup. Patient was admitted February 1 through February 4 for an acute asthma exacerbation with acute hypoxic respiratory failure. Chest x-ray without acute process noted. Patient was treated with IV antibiotics, and steroids. His virus panel was positive for rotavirus. He was discharged on a steroid taper and antibiotics. Patient says that he is much improved. He denies any increased wheezing, or cough. Patient has not been taken his Symbicort on a regular basis. Of note, patient is on an ACE inhibitor with lisinopril. Patient denies any fever or hemoptysis, orthopnea, PND, or leg swelling.  ROV 04/13/13 -- follows for asthma, allergic rhinitis. He is having more trouble lately with cough and mucous. He is on nasonex, has taken reliably.    Acute OV 12/13/14 Complains of increased chest congestion, some head congestion with yellow/green mucus, PND, bilateral ear congestion, some wheezing/tightness, increased SOB x1 week.   Denies f/c/s, n/v/d, hemoptysis, chest pain , orthopnea or edema.  Good appetite .  Not taking otc for tx .   ROV 01/31/15 -- follow  up visit asthma and chronic rhinitis. He also has a history of chronic kidney disease and diabetes. He was treated with azithromycin, Mucinex, saline nasal rinses in 12/13/14 for bronchitis. He has been doing fairly well, is using his albuterol about every 3 days.  He is on symbicort qd instead of bid and does well with this.  His nasal congestion is stable.   ROV 10/10/15 -- follow-up visit for chronic rhinitis, allergies, asthma. He has done well since last time, no flares. He got through the Spring allergy season. He is now off cyclosporine and lisinopril - his medicaid hsa changed and he has been off most of his meds. He has remained on symbicort only in the am. He rarely uses SABA > about 2-3x a month. He is starting his nasal washes now that the Fall allergy season is starting.    Review of Systems As per HPI     Objective:   Physical Exam Filed Vitals:   10/10/15 1644  BP: 110/78  Pulse: 85  Height: 5\' 5"  (1.651 m)  Weight: 181 lb (82.101 kg)  SpO2: 96%    GEN: A/Ox3; pleasant , NAD, well nourished   HEENT: OP clear, no nasal congestion.   NECK:  No stridor, no lesions,   RESP  Clear  P & A; w/o wheezes/ rales/ or rhonchi.no accessory muscle use  CARD:  RRR, no m/r/g  , no peripheral edema, pulses intact, no cyanosis or clubbing.  Musco: Warm bil, no deformities or joint swelling noted.  Neuro: alert, no focal deficits noted.    Skin: Warm, no lesions or rashes      Assessment & Plan:  Asthma, chronic He appears to be clinically stable without any evidence of an acute exacerbation. He continues Symbicort 2 puffs once a day and this has been adequate to manage his symptoms. He rarely uses albuterol. We will continue this regimen and manage his allergies. Flu shot today. Follow-up in one year

## 2015-10-10 NOTE — Patient Instructions (Signed)
Please continue Symbicort 2 puffs once a day  Take albuterol 2 puffs up to every 4 hours if needed for shortness of breath.  Continue your nasal saline washes as needed through the Fall allergy season.  Follow with Dr Lamonte Sakai in 12 months or sooner if you have any problems

## 2015-10-10 NOTE — Assessment & Plan Note (Signed)
He appears to be clinically stable without any evidence of an acute exacerbation. He continues Symbicort 2 puffs once a day and this has been adequate to manage his symptoms. He rarely uses albuterol. We will continue this regimen and manage his allergies. Flu shot today. Follow-up in one year

## 2015-10-16 ENCOUNTER — Ambulatory Visit: Payer: Self-pay | Admitting: Podiatry

## 2015-11-13 ENCOUNTER — Telehealth: Payer: Self-pay | Admitting: Emergency Medicine

## 2015-11-13 NOTE — Telephone Encounter (Signed)
Called spoke with pt. He reports his symbicort is being denied. Called [facility] and reports it is being denied and is sending over fax to start PA. Will await fax

## 2015-11-14 NOTE — Telephone Encounter (Signed)
Spoke with pt. Advised him that we are working on the [place]. Samples have been left at the front desk for pick up. Will await PA decision.

## 2015-11-14 NOTE — Telephone Encounter (Signed)
Pt is calling back about his symbicort [phone] pt calling back

## 2015-11-21 NOTE — Telephone Encounter (Signed)
Called [place] [facility] to check on denial/approval of symbicort. Was advised they never received PA but the reason it is not going through at the pharmacy is bc pt has "family planning" so he does not have pharmacy benefits. Pt needs to contact his case worker. LMTCB x1 for pt

## 2015-11-23 NOTE — Telephone Encounter (Signed)
Called and spoke with patient. Informed him of the " family planning" and that he does not have pharmacy benefits. I explained that he would have to discuss this with his case worker and if he had anymore questions to call our office. Patient voiced understanding and had no further questions.

## 2016-01-15 ENCOUNTER — Ambulatory Visit (INDEPENDENT_AMBULATORY_CARE_PROVIDER_SITE_OTHER): Payer: Self-pay | Admitting: Adult Health

## 2016-01-15 ENCOUNTER — Telehealth: Payer: Self-pay | Admitting: Emergency Medicine

## 2016-01-15 ENCOUNTER — Encounter: Payer: Self-pay | Admitting: Adult Health

## 2016-01-15 VITALS — BP 134/84 | HR 87 | Temp 98.4°F | Ht 65.0 in | Wt 181.0 lb

## 2016-01-15 DIAGNOSIS — J4521 Mild intermittent asthma with (acute) exacerbation: Secondary | ICD-10-CM

## 2016-01-15 MED ORDER — AZITHROMYCIN 250 MG PO TABS: ORAL_TABLET | ORAL | Status: AC

## 2016-01-15 NOTE — Telephone Encounter (Signed)
Samples have been left at front for pick up. Pt is aware. Nothing further was needed.

## 2016-01-15 NOTE — Patient Instructions (Signed)
Zpack to have on hold if symptoms worsen with discolored mucus.  Begin Zyrtec 10mg  At bedtime  For 1 week then As needed   Saline nasal rinses As needed   Mucinex DM Twice daily  As needed  Cough/congestion  Please contact office for sooner follow up if symptoms do not improve or worsen or seek emergency care  follow up Dr. Lamonte Sakai  As planned and As needed

## 2016-01-15 NOTE — Progress Notes (Signed)
Subjective:    Patient ID: Walter Harmon, male    DOB: January 02, 1966, 50 y.o.   MRN: [id number]  HPI 50 yo never smoker, referred for asthma evaluation on 09/09/13 with Dr. Lamonte Sakai   Patient has a history of diabetes and chronic kidney dz   01/15/2016 Acute OV : Asthma and AR  Pt presents for an acute office visit.  Complains of chest congestion/tightness, non prod cough and sinus congestion starting on 01/12/16. Denies any sinus pressure, fever, nausea or vomiting.  No otc used.  No wheezing or discolored mucus.  Remains on synbicort . No increased SABA use.    Past Medical History  Diagnosis Date  . Asthma   . Diabetes mellitus   . Hypertension    Current Outpatient Prescriptions on File Prior to Visit  Medication Sig Dispense Refill  . budesonide-formoterol (SYMBICORT) 160-4.5 MCG/ACT inhaler Inhale 2 puffs into the lungs daily. 1 Inhaler 6  . glipiZIDE (GLUCOTROL) 5 MG tablet Take 0.5 tablets (2.5 mg total) by mouth daily before breakfast. 30 tablet 0  . ibuprofen (ADVIL,MOTRIN) 200 MG tablet Take 200 mg by mouth every 6 (six) hours as needed for mild pain or moderate pain. Reported on 01/15/2016    . pravastatin (PRAVACHOL) 20 MG tablet Take 20 mg by mouth daily.    Marland Kitchen PROAIR HFA 108 (90 BASE) MCG/ACT inhaler INHALE TWO PUFFS BY MOUTH EVERY 6 HOURS AS NEEDED FOR WHEEZING 1 Inhaler 2  . [DISCONTINUED] famotidine (PEPCID) 20 MG tablet Take 1 tablet (20 mg total) by mouth 2 (two) times daily. 30 tablet 0   No current facility-administered medications on file prior to visit.   ROS Constitutional:   No  weight loss, night sweats,  Fevers, chills, fatigue, or  lassitude.  HEENT:   No headaches,  Difficulty swallowing,  Tooth/dental problems, or  Sore throat,                No sneezing, itching, ear ache,  +nasal congestion, post nasal drip,   CV:  No chest pain,  Orthopnea, PND, swelling in lower extremities, anasarca, dizziness, palpitations, syncope.   GI  No heartburn,  indigestion, abdominal pain, nausea, vomiting, diarrhea, change in bowel habits, loss of appetite, bloody stools.   Resp:  .  No chest wall deformity  Skin: no rash or lesions.  GU: no dysuria, change in color of urine, no urgency or frequency.  No flank pain, no hematuria   MS:  No joint pain or swelling.  No decreased range of motion.  No back pain.  Psych:  No change in mood or affect. No depression or anxiety.  No memory loss.         Objective:   Physical Exam Filed Vitals:   01/15/16 1626  BP: 134/84  Pulse: 87  Temp: 98.4 F (36.9 C)  TempSrc: Oral  Height: 5\' 5"  (1.651 m)  Weight: 181 lb (82.101 kg)  SpO2: 97%     GEN: A/Ox3; pleasant , NAD,   HEENT:  [place]/AT,  EACs-clear, TMs-wnl, NOSE-clear drainage ,  THROAT-clear drainage , no lesions, no postnasal drip or exudate noted.   NECK:  Supple w/ fair ROM; no JVD; normal carotid impulses w/o bruits; no thyromegaly or nodules palpated; no lymphadenopathy.  RESP  Clear  P & A; w/o, wheezes/ rales/ or rhonchi.no accessory muscle use  CARD:  RRR, no m/r/g  , no peripheral edema, pulses intact, no cyanosis or clubbing.  Musco: Warm bil, no deformities or joint  swelling noted.   Neuro: alert, no focal deficits noted.    Skin: Warm, no lesions or rashes         Assessment & Plan:

## 2016-01-15 NOTE — Assessment & Plan Note (Addendum)
Mild flare with URI /AR   Plan  Zpack to have on hold if symptoms worsen with discolored mucus.  Begin Zyrtec 10mg  At bedtime  For 1 week then As needed   Saline nasal rinses As needed   Mucinex DM Twice daily  As needed  Cough/congestion  Please contact office for sooner follow up if symptoms do not improve or worsen or seek emergency care  follow up Dr. Lamonte Sakai  As planned and As needed

## 2016-03-16 ENCOUNTER — Emergency Department (HOSPITAL_COMMUNITY)
Admission: EM | Admit: 2016-03-16 | Discharge: 2016-03-16 | Disposition: A | Discharge status: Home / Self Care | Payer: Medicaid Other | Attending: Emergency Medicine | Admitting: Emergency Medicine

## 2016-03-16 ENCOUNTER — Encounter (HOSPITAL_COMMUNITY): Payer: Self-pay

## 2016-03-16 DIAGNOSIS — E119 Type 2 diabetes mellitus without complications: Secondary | ICD-10-CM | POA: Insufficient documentation

## 2016-03-16 DIAGNOSIS — I10 Essential (primary) hypertension: Secondary | ICD-10-CM | POA: Insufficient documentation

## 2016-03-16 DIAGNOSIS — Z7951 Long term (current) use of inhaled steroids: Secondary | ICD-10-CM | POA: Insufficient documentation

## 2016-03-16 DIAGNOSIS — J45909 Unspecified asthma, uncomplicated: Secondary | ICD-10-CM | POA: Insufficient documentation

## 2016-03-16 DIAGNOSIS — Z79899 Other long term (current) drug therapy: Secondary | ICD-10-CM | POA: Insufficient documentation

## 2016-03-16 DIAGNOSIS — L03011 Cellulitis of right finger: Secondary | ICD-10-CM

## 2016-03-16 MED ORDER — BUPIVACAINE HCL (PF) 0.5 % IJ SOLN
10.0000 mL | Freq: Once | INTRAMUSCULAR | Status: AC
  Administered 2016-03-16: 10 mL
  Filled 2016-03-16: qty 10

## 2016-03-16 NOTE — Discharge Instructions (Signed)
Paronychia  Paronychia is an infection of the skin. It happens near a fingernail or toenail. It may cause pain and swelling around the nail. Usually, it is not serious and it clears up with treatment. HOME CARE  Soak the fingers or toes in warm water as told by your doctor. You may be told to do this for 20 minutes, 2-3 times a day.  Keep the area dry when you are not soaking it.  Take medicines only as told by your doctor.  If you were given an antibiotic medicine, finish all of it even if you start to feel better.  Keep the affected area clean.  Do not try to drain a fluid-filled bump yourself.  Wear rubber gloves when putting your hands in water.  Wear gloves if your hands might touch cleaners or chemicals.  Follow your doctor's instructions about:  Wound care.  Bandage (dressing) changes and removal. GET HELP IF:  Your symptoms get worse or do not improve.  You have a fever or chills.  You have redness spreading from the affected area.  You have more fluid, blood, or pus coming from the affected area.  Your finger or knuckle is swollen or is hard to move.   This information is not intended to replace advice given to you by your health care provider. Make sure you discuss any questions you have with your health care provider.  Soak your finger at home in warm water. Avoid manual manipulation of your infection. Keep wound clean and dry. Return to the emergency department if you experience of a worsening of her symptoms, increased redness or swelling around her fingertips, fever, chills.

## 2016-03-16 NOTE — ED Provider Notes (Signed)
CSN: [id number]     Arrival date & time 03/16/16  1657 History  By signing my name below, I, Walter Harmon, attest that this documentation has been prepared under the direction and in the presence of [employer], PA-C. Electronically Signed: Judithann Harmon, ED Scribe. 03/16/2016. 5:54 PM.    Chief Complaint  Patient presents with  . Wound Infection   The history is provided by the patient. No language interpreter was used.   HPI Comments: Walter Harmon is a 50 y.o. male with a hx of DM and HTN who presents to the Emergency Department complaining of gradually worsening pain and swelling to his right index finger under his nailbed onset 2 weeks ago. He states that he used the needle on his blood sugar machine to drain fluid off the finger last week which provided relief of his pain but the area came back yesterday. No other alleviating factors noted. Pt has not tried any medication for relief. He denies any fever or chills.    Past Medical History  Diagnosis Date  . Asthma   . Diabetes mellitus   . Hypertension    Past Surgical History  Procedure Laterality Date  . Ankle surgery     Family History  Problem Relation Age of Onset  . Diabetes type II Mother    Social History  Substance Use Topics  . Smoking status: Never Smoker   . Smokeless tobacco: Never Used  . Alcohol Use: No    Review of Systems  All other systems reviewed and are negative.     Allergies  Review of patient's allergies indicates no known allergies.  Home Medications   Prior to Admission medications   Medication Sig Start Date End Date Taking? Authorizing Provider  budesonide-formoterol (SYMBICORT) 160-4.5 MCG/ACT inhaler Inhale 2 puffs into the lungs daily. 03/09/15   Collene Gobble, MD  glipiZIDE (GLUCOTROL) 5 MG tablet Take 0.5 tablets (2.5 mg total) by mouth daily before breakfast. 01/26/14   Charlynne Cousins, MD  ibuprofen (ADVIL,MOTRIN) 200 MG tablet Take 200 mg by mouth every 6  (six) hours as needed for mild pain or moderate pain. Reported on 01/15/2016    Historical Provider, MD  pravastatin (PRAVACHOL) 20 MG tablet Take 20 mg by mouth daily.    Historical Provider, MD  PROAIR HFA 108 (90 BASE) MCG/ACT inhaler INHALE TWO PUFFS BY MOUTH EVERY 6 HOURS AS NEEDED FOR WHEEZING 04/13/15   Collene Gobble, MD   BP 145/87 mmHg  Pulse 89  Temp(Src) 98.4 F (36.9 C) (Oral)  Resp 18  Wt 180 lb 14.4 oz (82.056 kg)  SpO2 97% Physical Exam  Constitutional: He is oriented to person, place, and time. He appears well-developed and well-nourished. No distress.  HENT:  Head: Normocephalic and atraumatic.  Eyes: Conjunctivae are normal. Right eye exhibits no discharge. Left eye exhibits no discharge. No scleral icterus.  Cardiovascular: Normal rate.   Pulmonary/Chest: Effort normal.  Musculoskeletal:  Swelling and erythema surrounding right index finger nailbed. Nailbed intact, no evidence of tendon injury, no finger pad swelling.   Neurological: He is alert and oriented to person, place, and time. Coordination normal.  Skin: Skin is warm and dry. No rash noted. He is not diaphoretic. There is erythema. No pallor.  Psychiatric: He has a normal mood and affect. His behavior is normal.  Nursing note and vitals reviewed.   ED Course  Procedures (including critical care time) DIAGNOSTIC STUDIES: Oxygen Saturation is 97% on RA, normal by my  interpretation.    COORDINATION OF CARE: 5:49 PM- Pt advised of plan for treatment and pt agrees. Advised to soak finger in hot water instead of draining it himself. He will receive a digital nerve block.    NERVE BLOCK Performed by: Donnald Garre, PA-C Consent: Verbal consent obtained. Required items: required blood products, implants, devices, and special equipment available Time out: Immediately prior to procedure a "time out" was called to verify the correct patient, procedure, equipment, support staff and site/side marked as  required.  Indication: paronychia Nerve block body site: right index digit  Preparation: Patient was prepped and draped in the usual sterile fashion. Needle gauge: 24 G Location technique: anatomical landmarks  Local anesthetic: bupivicaine  Anesthetic total: 5 ml  Outcome: pain improved Patient tolerance: Patient tolerated the procedure well with no immediate complications.   MDM   Final diagnoses:  Paronychia, right    Patient presents with paronychia of right index finger. Digital nerve block performed. Patient tolerated this procedure well. Cuticle lifted and paronychia drained. Patient encouraged to soak finger in warm water twice daily for 5 days. No antibiotics indicated at this time. No concern for spread of infection or felon. Return precautions outlined to patient discharge instructions. I personally performed the services described in this documentation, which was scribed in my presence. The recorded information has been reviewed and is accurate.      Walter Spry [place], PA-C 03/16/16 1856  Walter Belling, MD 03/16/16 2322

## 2016-03-16 NOTE — ED Notes (Signed)
Declined W/C at D/C and was escorted to lobby by RN. 

## 2016-03-16 NOTE — ED Notes (Signed)
Rt. Index finer nailbed is infected.  Pt. Opened it up a few weeks ago and drained it.  It became infected again

## 2016-03-18 ENCOUNTER — Telehealth: Payer: Self-pay | Admitting: Emergency Medicine

## 2016-03-18 NOTE — Telephone Encounter (Signed)
Pt calling requesting samples of Symbicort 151mcg Placed samples up front. Nothing further needed.

## 2016-05-28 ENCOUNTER — Telehealth: Payer: Self-pay | Admitting: Emergency Medicine

## 2016-05-28 NOTE — Telephone Encounter (Signed)
Spoke with pt. He needs a sample of Symbicort. Advised him that we do not have any at this time. Nothing further was needed.

## 2016-06-10 ENCOUNTER — Telehealth: Payer: Self-pay | Admitting: Emergency Medicine

## 2016-06-10 NOTE — Telephone Encounter (Signed)
Samples have been left at the front desk. Pt is aware. Nothing further was needed. 

## 2016-09-11 ENCOUNTER — Telehealth: Payer: Self-pay | Admitting: Emergency Medicine

## 2016-09-11 NOTE — Telephone Encounter (Signed)
Samples have been received and placed at the front for pick up.  Called spoke with pt. Informed him that samples have been placed at the front for pick up. He voiced understanding and had no further questions.

## 2016-09-11 NOTE — Telephone Encounter (Signed)
Called spoke with pt. He states he was calling for samples of symbicort. I explained to him that we do not have samples at this time. He states he will call back and check later in the week. He voiced understanding and had no further questions.

## 2016-11-21 ENCOUNTER — Telehealth: Payer: Self-pay | Admitting: Emergency Medicine

## 2016-11-21 NOTE — Telephone Encounter (Signed)
We are out of the symbicort samples right now  Nothing further needed

## 2016-11-22 ENCOUNTER — Telehealth: Payer: Self-pay | Admitting: Emergency Medicine

## 2016-11-22 MED ORDER — PROAIR HFA 108 (90 BASE) MCG/ACT IN AERS: INHALATION_SPRAY | RESPIRATORY_TRACT | 2 refills | Status: DC

## 2016-11-22 MED ORDER — BUDESONIDE-FORMOTEROL FUMARATE 160-4.5 MCG/ACT IN AERO: 2.0000 | INHALATION_SPRAY | Freq: Every day | RESPIRATORY_TRACT | 2 refills | Status: DC

## 2016-11-22 NOTE — Telephone Encounter (Signed)
Called and spoke with pt and he is aware of refills that have been sent to his pharmacy. Nothing further is needed.

## 2016-11-26 ENCOUNTER — Telehealth: Payer: Self-pay | Admitting: Emergency Medicine

## 2016-11-26 NOTE — Telephone Encounter (Signed)
Attempted to contact the pt but no answer.  Will try back later.

## 2016-11-27 ENCOUNTER — Telehealth: Payer: Self-pay | Admitting: Emergency Medicine

## 2016-11-27 NOTE — Telephone Encounter (Signed)
Attempted to contact pt. No answer, no option to leave a message. Will try back.  

## 2016-11-27 NOTE — Telephone Encounter (Signed)
Patient is returning phone call. Please call patient at work [phone] Ext: [phone]

## 2016-11-27 NOTE — Telephone Encounter (Signed)
Spoke with the pt  He reports that his ins denied symbicort  I advised in order to provide alternative, we will need a list of alternatives  He will call ins and find out and call us back

## 2016-11-27 NOTE — Telephone Encounter (Signed)
ATC x 1  Automated message stated that the patient is unavailable at this time.  WCB

## 2016-11-28 ENCOUNTER — Encounter: Payer: Self-pay | Admitting: Acute Care

## 2016-11-28 ENCOUNTER — Ambulatory Visit (INDEPENDENT_AMBULATORY_CARE_PROVIDER_SITE_OTHER): Payer: Medicaid Other | Admitting: Acute Care

## 2016-11-28 DIAGNOSIS — Z23 Encounter for immunization: Secondary | ICD-10-CM | POA: Diagnosis not present

## 2016-11-28 DIAGNOSIS — J4520 Mild intermittent asthma, uncomplicated: Secondary | ICD-10-CM

## 2016-11-28 MED ORDER — BUDESONIDE-FORMOTEROL FUMARATE 160-4.5 MCG/ACT IN AERO: 2.0000 | INHALATION_SPRAY | Freq: Every day | RESPIRATORY_TRACT | 1 refills | Status: DC

## 2016-11-28 NOTE — Progress Notes (Signed)
History of Present Illness Walter Harmon is a 50 y.o. male never smoker referred for asthma evaluation on 09/09/13 with Dr. Lamonte Sakai   Patient has a history of diabetes and chronic kidney dz    11/28/2016 Follow UP OV: Pt. Presents today for follow up. Per the patient his insurance [employer]) is no longer covering his Symbicort.He has been out of Symbicort x 1 week. He is using [employer] every 2-3 hours instead. He has called his Medicare caseworker asking which of the same class of drug is better covered for him. He will let us know when he is given that information, so we can write the appropriate RX. He states he is currently having allergic rhinitis with PND which is impacting his asthma. Drainage is clear.He is not using any non-sedating anti histamines or nasal sprays. He states he is using some nasal saline. He has not had his flu shot this year.He is not wheezing today on exam.He denies fever, chest pain, orthopnea or hemoptysis. He is currently not seeing a PCP. I have encouraged him to re-establish with a PCP as he has several chronic health issues that need to be followed on a regular basis.     Past medical hx Past Medical History:  Diagnosis Date  . Asthma   . Diabetes mellitus   . Hypertension      Past surgical hx, Family hx, Social hx all reviewed.  Current Outpatient Prescriptions on File Prior to Visit  Medication Sig  . PROAIR HFA 108 (90 Base) MCG/ACT inhaler INHALE TWO PUFFS BY MOUTH EVERY 6 HOURS AS NEEDED FOR WHEEZING  . budesonide-formoterol (SYMBICORT) 160-4.5 MCG/ACT inhaler Inhale 2 puffs into the lungs daily. (Patient not taking: Reported on 11/28/2016)  . glipiZIDE (GLUCOTROL) 5 MG tablet Take 0.5 tablets (2.5 mg total) by mouth daily before breakfast. (Patient not taking: Reported on 11/28/2016)  . ibuprofen (ADVIL,MOTRIN) 200 MG tablet Take 200 mg by mouth every 6 (six) hours as needed for mild pain or moderate pain. Reported on 01/15/2016  . pravastatin  (PRAVACHOL) 20 MG tablet Take 20 mg by mouth daily.  . [DISCONTINUED] famotidine (PEPCID) 20 MG tablet Take 1 tablet (20 mg total) by mouth 2 (two) times daily.   No current facility-administered medications on file prior to visit.      No Known Allergies  Review Of Systems:  Constitutional:   No  weight loss, night sweats,  Fevers, chills, fatigue, or  lassitude.  HEENT:   No headaches,  Difficulty swallowing,  Tooth/dental problems, or  Sore throat,                No sneezing, itching, ear ache, +nasal congestion, +post nasal drip,   CV:  No chest pain,  Orthopnea, PND, swelling in lower extremities, anasarca, dizziness, palpitations, syncope.   GI  No heartburn, indigestion, abdominal pain, nausea, vomiting, diarrhea, change in bowel habits, loss of appetite, bloody stools.   Resp: + shortness of breath with exertion not  at rest.  No excess mucus, no productive cough,  + non-productive cough,  No coughing up of blood.  No change in color of mucus.  + wheezing.  No chest wall deformity  Skin: no rash or lesions.  GU: no dysuria, change in color of urine, no urgency or frequency.  No flank pain, no hematuria   MS:  No joint pain or swelling.  No decreased range of motion.  No back pain.  Psych:  No change in mood or  affect. No depression or anxiety.  No memory loss.   Vital Signs BP 126/70 (BP Location: Left Arm, Cuff Size: Normal)   Pulse 99   Ht 5\' 5"  (1.651 m)   Wt 167 lb (75.8 kg)   SpO2 97%   BMI 27.79 kg/m    Physical Exam:  General- No distress,  A&Ox3, pleasant ENT: No sinus tenderness, TM clear, pale nasal mucosa, no oral exudate,+ post nasal drip, no LAN Cardiac: S1, S2, regular rate and rhythm, no murmur Chest: No wheeze/ rales/ dullness; no accessory muscle use, no nasal flaring, no sternal retractions Abd.: Soft Non-tender Ext: No clubbing cyanosis, edema Neuro:  normal strength Skin: No rashes, warm and dry Psych: normal mood and  behavior   Assessment/Plan  Asthma, chronic AR causing mild asthma flare Out of Symbicort x 1 week ( Medicaid no longer covering) Plan: We will give you a sample of Symbicort today. Symbicort 2 puffs twice daily  Rinse mouth after use. Please let us know which of the Symbicort drug equivalents your insurance will cover so we can write you a prescription. Please add Zyrtec or Claritin for the post nasal drip you are experiencing. You can add Flonase ( Generic fluticasone ok) nasal spray 2 puffs in each nare daily for nasal congestion. Flu shot today. Please re-establish with a primary care doctor to help manage your chronic problems. Follow up with Dr. Lamonte Sakai in 3 months. Please contact office for sooner follow up if symptoms do not improve or worsen or seek emergency care        Magdalen Spatz, NP 11/28/2016  11:10 AM

## 2016-11-28 NOTE — Addendum Note (Signed)
Addended by: Mathis Bud on: 11/28/2016 11:21 AM   Modules accepted: Orders

## 2016-11-28 NOTE — Assessment & Plan Note (Signed)
AR causing mild asthma flare Out of Symbicort x 1 week ( Medicaid no longer covering) Plan: We will give you a sample of Symbicort today. Symbicort 2 puffs twice daily  Rinse mouth after use. Please let us know which of the Symbicort drug equivalents your insurance will cover so we can write you a prescription. Please add Zyrtec or Claritin for the post nasal drip you are experiencing. You can add Flonase ( Generic fluticasone ok) nasal spray 2 puffs in each nare daily for nasal congestion. Flu shot today. Please re-establish with a primary care doctor to help manage your chronic problems. Follow up with Dr. Lamonte Sakai in 3 months. Please contact office for sooner follow up if symptoms do not improve or worsen or seek emergency care

## 2016-11-28 NOTE — Telephone Encounter (Signed)
LMTCB

## 2016-11-28 NOTE — Patient Instructions (Addendum)
It is nice to meet you today. We will give you a sample of Symbicort today. Symbicort 2 puffs twice daily  Rinse mouth after use. Please let us know which of the drug equivalents your insurance will cover so we can write you a prescription. Please add Zyrtec of Claritin for the post nasal drip you are experiencing. You can add Flonase ( Generic fluticasone ok) nasal spray 2 puffs in each nare daily for nasal congestion. Flu shot today. Please re-establish with a primary care doctor to help manage your chronic problems. Follow up with Dr. Lamonte Sakai in 3 months. Please contact office for sooner follow up if symptoms do not improve or worsen or seek emergency care

## 2016-11-29 ENCOUNTER — Telehealth: Payer: Self-pay | Admitting: Emergency Medicine

## 2016-11-29 NOTE — Telephone Encounter (Signed)
Pt called and stated that he was seen yesterday and given the samples of the symbicort, but this was denied by his insurance and it will either need to be changed or we will need to try and do the PA for the symbicort.  RB please advise on what you would like to do. Thanks

## 2016-11-29 NOTE — Telephone Encounter (Signed)
Called and spoke with pt and he stated that he received samples yesterday. Nothing further is needed.

## 2016-12-02 NOTE — Telephone Encounter (Signed)
Lmtcb. Will await call back 

## 2016-12-02 NOTE — Telephone Encounter (Signed)
As recommended in his OV with SG, we need to know which ICS / LABA is covered by his insurance formulary (medicaid). Get this info from him and we will order the appropriate substitution for the symbicort

## 2016-12-03 NOTE — Telephone Encounter (Signed)
Spoke with pt, states he will call medicaid to see what inhalers are covered by insurance and relay this info back to [place].  Will await call.

## 2016-12-06 NOTE — Telephone Encounter (Signed)
lmtcb x1 for pt. 

## 2016-12-09 NOTE — Telephone Encounter (Signed)
lmtcb for pt.  

## 2016-12-10 NOTE — Telephone Encounter (Signed)
PA form has been faxed. Form has been placed in the yellow folder in RB's look at labeled >> RB's pending PAs.

## 2016-12-10 NOTE — Telephone Encounter (Signed)
Pt returning call.Walter Harmon ° °

## 2016-12-10 NOTE — Telephone Encounter (Signed)
Called spoke with patient who reports that insurance informed him that Symbicort is covered by his insurance, but prior authorization needs to be done first.  This has been verified by [employer]  PA form printed for RB and placed in his lookat Routing to [place] and [place]

## 2016-12-10 NOTE — Telephone Encounter (Signed)
PA form has been filled out to the best of my ability. Will await RB's signature. Form will be faxed once it's signed.

## 2016-12-12 NOTE — Telephone Encounter (Signed)
Called [employer] and the PA for the Symbicort 160 has been approved from 12/10/2016 until 12/05/2017. Pharmacy is aware.   Approval # [id number]

## 2017-01-02 ENCOUNTER — Ambulatory Visit: Payer: Medicaid Other | Admitting: Emergency Medicine

## 2017-02-27 ENCOUNTER — Encounter: Payer: Self-pay | Admitting: Emergency Medicine

## 2017-02-27 ENCOUNTER — Ambulatory Visit (INDEPENDENT_AMBULATORY_CARE_PROVIDER_SITE_OTHER): Payer: Medicaid Other | Admitting: Emergency Medicine

## 2017-02-27 DIAGNOSIS — J309 Allergic rhinitis, unspecified: Secondary | ICD-10-CM | POA: Insufficient documentation

## 2017-02-27 DIAGNOSIS — J301 Allergic rhinitis due to pollen: Secondary | ICD-10-CM

## 2017-02-27 DIAGNOSIS — J4520 Mild intermittent asthma, uncomplicated: Secondary | ICD-10-CM

## 2017-02-27 NOTE — Assessment & Plan Note (Signed)
Please continue your Symbicort 2 puffs once a day. Increase to twice a day when your asthma is more active.  Take albuterol 2 puffs up to every 4 hours if needed for shortness of breath.  Follow with Dr Lamonte Sakai in 12 months or sooner if you have any problems

## 2017-02-27 NOTE — Patient Instructions (Signed)
Please continue your Symbicort 2 puffs once a day. Increase to twice a day when your asthma is more active.  Take albuterol 2 puffs up to every 4 hours if needed for shortness of breath.  Restart your zyrtec or claritin this Spring allergy season.  Follow with Dr Lamonte Sakai in 12 months or sooner if you have any problems

## 2017-02-27 NOTE — Assessment & Plan Note (Signed)
Restart anti-histamine for the allergy season.

## 2017-02-27 NOTE — Progress Notes (Signed)
   Subjective:    Patient ID: Walter Harmon, male    DOB: Jul 31, 1966, 51 y.o.   MRN: [id number]  HPI 51 yo never smoker, hx of childhood and adult asthma. Also allergic rhinitis.  Patient has a history of diabetes and chronic kidney disease. He is no longer followed by nephrology, needs to re-establish.   Has had difficulty when he has had to come off Symbicort - usually due to cost   Currently on Symbicort once a day, able to get it now through [place]. He uses albuterol rarely, sometimes w exertion, change in weather. No flares since last time.    Review of Systems As per HPI     Objective:   Physical Exam Vitals:   02/27/17 0932  BP: 128/88  Pulse: 87  SpO2: 97%  Weight: 166 lb 3.2 oz (75.4 kg)  Height: 5\' 5"  (1.651 m)    GEN: A/Ox3; pleasant , NAD, well nourished    HEENT: OP clear, no nasal congestion.   NECK:  No stridor, no lesions,     RESP  Clear  P & A; w/o wheezes/ rales/ or rhonchi. no accessory muscle use  CARD:  RRR, no m/r/g  , no peripheral edema, pulses intact, no cyanosis or clubbing.  Musco: Warm bil, no deformities or joint swelling noted.   Neuro: alert, no focal deficits noted.    Skin: Warm, no lesions or rashes      Assessment & Plan:  Asthma, chronic Please continue your Symbicort 2 puffs once a day. Increase to twice a day when your asthma is more active.  Take albuterol 2 puffs up to every 4 hours if needed for shortness of breath.  Follow with Dr Lamonte Sakai in 12 months or sooner if you have any problems  Allergic rhinitis Restart anti-histamine for the allergy season.   Baltazar Apo, MD, PhD 02/27/2017, 9:55 AM [facility] [phone] or if no answer [phone]

## 2017-09-28 ENCOUNTER — Other Ambulatory Visit: Payer: Self-pay | Admitting: Emergency Medicine

## 2018-02-20 ENCOUNTER — Telehealth: Payer: Self-pay | Admitting: Emergency Medicine

## 2018-02-20 MED ORDER — BUDESONIDE-FORMOTEROL FUMARATE 160-4.5 MCG/ACT IN AERO: INHALATION_SPRAY | RESPIRATORY_TRACT | 3 refills | Status: DC

## 2018-02-20 NOTE — Telephone Encounter (Signed)
Called pt letting him know I sent a refill of symbicort to his preferred pharmacy.  Pt expressed understanding. Nothing further needed at this current time.

## 2018-02-27 ENCOUNTER — Ambulatory Visit (INDEPENDENT_AMBULATORY_CARE_PROVIDER_SITE_OTHER): Payer: Self-pay | Admitting: Emergency Medicine

## 2018-02-27 ENCOUNTER — Encounter: Payer: Self-pay | Admitting: Emergency Medicine

## 2018-02-27 DIAGNOSIS — Z23 Encounter for immunization: Secondary | ICD-10-CM

## 2018-02-27 DIAGNOSIS — J4520 Mild intermittent asthma, uncomplicated: Secondary | ICD-10-CM

## 2018-02-27 DIAGNOSIS — J301 Allergic rhinitis due to pollen: Secondary | ICD-10-CM

## 2018-02-27 MED ORDER — PROAIR HFA 108 (90 BASE) MCG/ACT IN AERS: INHALATION_SPRAY | RESPIRATORY_TRACT | 5 refills | Status: DC

## 2018-02-27 NOTE — Assessment & Plan Note (Signed)
Good control on his current regimen.  He is using Symbicort but only once a day on most days.  I cautioned him about carrying the Symbicort and considering it to be a rescue medication.  I have advised him to stop this and to use pro-air if needed for breakthrough symptoms.  Flu shot today.  I will see him in 9 months so that we can give him his flu shot earlier next year.

## 2018-02-27 NOTE — Patient Instructions (Signed)
Continue your Symbicort 2 puffs once a day in the morning.  Remember to rinse and gargle after you use this medication. Do not use your Symbicort as a rescue medication.  There may be some danger associated with this. We will refill your pro-air.  Use this medication 2 puffs up to every 4 hours if needed for shortness of breath, wheezing, chest tightness. Flu shot today. You may want to try using loratadine 10 mg daily if needed for congestion and allergy symptoms. Follow with Dr Lamonte Sakai in 9 months or sooner if you have any problems

## 2018-02-27 NOTE — Progress Notes (Signed)
   Subjective:    Patient ID: Walter Harmon, male    DOB: 18-Jun-1966, 52  y.o.   MRN: [id number]  HPI  ROV 02/27/18 --[name] is a 52 year old gentleman, never smoker with a history of childhood and then subsequently adult asthma.  He also has allergic rhinitis, diabetes, chronic kidney disease.  He returns today reporting that he has been well. He has Symbicort that he is using once a day, occasionally uses bid. He never got proair prescription. Uses the symbicort occasionally as rescue. Flu shot up to date.    Review of Systems As per HPI     Objective:   Physical Exam Vitals:   02/27/18 1119  BP: (!) 132/94  Pulse: 78  SpO2: 97%  Weight: 165 lb (74.8 kg)  Height: 5\' 5"  (1.651 m)    GEN: A/Ox3; pleasant , NAD, well nourished    HEENT: OP clear, no nasal congestion.   NECK:  No stridor, no lesions,     RESP  Clear  P & A; w/o wheezes/ rales/ or rhonchi. no accessory muscle use  CARD:  RRR, no m/r/g  , no peripheral edema, pulses intact, no cyanosis or clubbing.  Musco: Warm bil, no deformities or joint swelling noted.   Neuro: alert, no focal deficits noted.    Skin: Warm, no lesions or rashes      Assessment & Plan:  Allergic rhinitis Starting to have some symptoms.  Not currently on medications.  I recommended loratadine if needed.  Asthma, chronic Good control on his current regimen.  He is using Symbicort but only once a day on most days.  I cautioned him about carrying the Symbicort and considering it to be a rescue medication.  I have advised him to stop this and to use pro-air if needed for breakthrough symptoms.  Flu shot today.  I will see him in 9 months so that we can give him his flu shot earlier next year.  Baltazar Apo, MD, PhD 02/27/2018, 11:51 AM [facility] [phone] or if no answer [phone]

## 2018-02-27 NOTE — Assessment & Plan Note (Signed)
Starting to have some symptoms.  Not currently on medications.  I recommended loratadine if needed.

## 2018-11-13 ENCOUNTER — Telehealth: Payer: Self-pay | Admitting: Emergency Medicine

## 2018-11-13 MED ORDER — BUDESONIDE-FORMOTEROL FUMARATE 160-4.5 MCG/ACT IN AERO: 2.0000 | INHALATION_SPRAY | Freq: Two times a day (BID) | RESPIRATORY_TRACT | 0 refills | Status: DC

## 2018-11-13 NOTE — Telephone Encounter (Signed)
Pt is requesting a sample of Symbicort 160, as he is currently uninsured.  One sample of symbicort 160 has been placed up front for pick up. Nothing further is needed.

## 2018-11-24 ENCOUNTER — Ambulatory Visit: Payer: Self-pay | Admitting: Emergency Medicine

## 2019-02-05 ENCOUNTER — Telehealth: Payer: Self-pay | Admitting: Emergency Medicine

## 2019-02-05 NOTE — Telephone Encounter (Signed)
Called and spoke with patient regarding symb 160 samples Pt just lost his insurance coverage, in need of samples at this time Provided pt (2) samples today, and printed pt asst forms to help him in future Patient is picking both samples and paperwork in office today before 5pm Nothing further needed at this time.

## 2019-04-29 ENCOUNTER — Emergency Department (HOSPITAL_COMMUNITY): Payer: Self-pay

## 2019-04-29 ENCOUNTER — Emergency Department (HOSPITAL_COMMUNITY)
Admission: EM | Admit: 2019-04-29 | Discharge: 2019-04-29 | Disposition: A | Discharge status: Home / Self Care | Payer: Self-pay | Attending: Emergency Medicine | Admitting: Emergency Medicine

## 2019-04-29 ENCOUNTER — Encounter (HOSPITAL_COMMUNITY): Payer: Self-pay | Admitting: Emergency Medicine

## 2019-04-29 ENCOUNTER — Other Ambulatory Visit: Payer: Self-pay

## 2019-04-29 DIAGNOSIS — R319 Hematuria, unspecified: Secondary | ICD-10-CM | POA: Insufficient documentation

## 2019-04-29 DIAGNOSIS — N50811 Right testicular pain: Secondary | ICD-10-CM

## 2019-04-29 DIAGNOSIS — I129 Hypertensive chronic kidney disease with stage 1 through stage 4 chronic kidney disease, or unspecified chronic kidney disease: Secondary | ICD-10-CM | POA: Insufficient documentation

## 2019-04-29 DIAGNOSIS — E1122 Type 2 diabetes mellitus with diabetic chronic kidney disease: Secondary | ICD-10-CM | POA: Insufficient documentation

## 2019-04-29 DIAGNOSIS — N183 Chronic kidney disease, stage 3 (moderate): Secondary | ICD-10-CM | POA: Insufficient documentation

## 2019-04-29 LAB — URINALYSIS, ROUTINE W REFLEX MICROSCOPIC
Bilirubin Urine: NEGATIVE
Glucose, UA: >=500 mg/dL — AB
Ketones, ur: NEGATIVE mg/dL
Nitrite: NEGATIVE
Protein, ur: 100 mg/dL — AB
Specific Gravity, Urine: 1.012 (ref 1.005–1.030)
WBC, UA: >50 WBC/hpf — ABNORMAL HIGH (ref 0–5)
pH: 7 (ref 5.0–8.0)

## 2019-04-29 MED ORDER — LEVOFLOXACIN 500 MG PO TABS: 500.0000 mg | ORAL_TABLET | Freq: Every day | ORAL | 0 refills | Status: AC

## 2019-04-29 NOTE — Discharge Instructions (Signed)
You were seen today with abdominal and testicle pain. There is some evidence of infection in your urine. Given your symptoms I am going to treat you with an antibiotic for the next 10 days. If you begin to get rash, chest pain, shortness or breath, or pain in your arms/legs you should stop the antibiotic and return to the ED. '  Call the urologist listed to schedule a follow up for your testicle pain and blood in the urine.

## 2019-04-29 NOTE — ED Triage Notes (Signed)
Onset April having intermittent groin swelling and hematuria. Recently now have symptoms constantly with cloudy urine. States has a history of a hernia when he was 53y/o with surgery.

## 2019-04-29 NOTE — ED Provider Notes (Signed)
Emergency Department Provider Note   I have reviewed the triage vital signs and the nursing notes.   HISTORY  Chief Complaint Groin Swelling; Dysuria; and Hematuria   HPI Walter Harmon is a 53 y.o. male with PMH of asthma, DM, and HTN presents to the emergency department for evaluation of right groin and testicle pain.  Symptoms have been worsening over the past several months.  Patient has had inguinal hernia repair back in the late 80s on the side.  He continues to have bowel movements and denies nausea or vomiting.  He does have some mild dysuria.  Patient has not been sexually active in 2 years since his girlfriend died.  He has no concern for STD exposure.  He has begun to see blood in the urine intermittently over the past 2 weeks. No fever or chills.     Past Medical History:  Diagnosis Date  . Asthma   . Diabetes mellitus   . Hypertension     Patient Active Problem List   Diagnosis Date Noted  . Allergic rhinitis 02/27/2017  . CKD (chronic kidney disease), stage III ([place]) 08/12/2012  . Tachycardia 08/12/2012  . Asthma, chronic 08/10/2012  . Diabetes type 2, controlled ([place]) 08/10/2012  . ANAL FISSURE 07/13/2008    Past Surgical History:  Procedure Laterality Date  . ANKLE SURGERY      Allergies Patient has no known allergies.  Family History  Problem Relation Age of Onset  . Diabetes type II Mother     Social History Social History   Tobacco Use  . Smoking status: Never Smoker  . Smokeless tobacco: Never Used  Substance Use Topics  . Alcohol use: No  . Drug use: No    Review of Systems  Constitutional: No fever/chills Eyes: No visual changes. ENT: No sore throat. Cardiovascular: Denies chest pain. Respiratory: Denies shortness of breath. Gastrointestinal: No abdominal pain.  No nausea, no vomiting.  No diarrhea.  No constipation. Genitourinary: Positive for dysuria. Right groin tenderness.  Musculoskeletal: Negative for back pain.  Skin: Negative for rash. Neurological: Negative for headaches, focal weakness or numbness.  10-point ROS otherwise negative.  ____________________________________________   PHYSICAL EXAM:  VITAL SIGNS: ED Triage Vitals  Enc Vitals Group     BP 04/29/19 1355 (!) 153/107     Pulse Rate 04/29/19 1355 (!) 103     Resp 04/29/19 1355 18     Temp 04/29/19 1355 98 F (36.7 C)     Temp Source 04/29/19 1355 Oral     SpO2 04/29/19 1355 99 %     Weight 04/29/19 1413 163 lb (73.9 kg)     Height 04/29/19 1413 5\' 5"  (1.651 m)     Pain Score 04/29/19 1413 7   Constitutional: Alert and oriented. Well appearing and in no acute distress. Eyes: Conjunctivae are normal. Head: Atraumatic. Nose: No congestion/rhinnorhea. Mouth/Throat: Mucous membranes are moist.  Neck: No stridor.   Cardiovascular: Tachycardia. Good peripheral circulation. Grossly normal heart sounds.   Respiratory: Normal respiratory effort.  No retractions. Lungs CTAB. Gastrointestinal: Soft and nontender. No distention.  Genitourinary: No obvious inguinal hernia palpation in the inguinal canal. Mild posterior right testicle tenderness. No scrotal cellulitis.  Musculoskeletal: No lower extremity tenderness nor edema. No gross deformities of extremities. Neurologic:  Normal speech and language. No gross focal neurologic deficits are appreciated.  Skin:  Skin is warm, dry and intact. No rash noted.  ____________________________________________   LABS (all labs ordered are listed, but only  abnormal results are displayed)  Labs Reviewed  URINALYSIS, ROUTINE W REFLEX MICROSCOPIC - Abnormal; Notable for the following components:      Result Value   APPearance CLOUDY (*)    Glucose, UA >=500 (*)    Hgb urine dipstick SMALL (*)    Protein, ur 100 (*)    Leukocytes,Ua LARGE (*)    WBC, UA >50 (*)    Bacteria, UA RARE (*)    All other components within normal limits  URINE CULTURE    ____________________________________________  RADIOLOGY  Ct Renal Stone Study  Result Date: 04/29/2019 CLINICAL DATA:  Flank pain.  Renal stone disease suspected. EXAM: CT ABDOMEN AND PELVIS WITHOUT CONTRAST TECHNIQUE: Multidetector CT imaging of the abdomen and pelvis was performed following the standard protocol without IV contrast. COMPARISON:  CT 05/30/2014 FINDINGS: Lower chest: Normal Hepatobiliary: Normal Pancreas: Normal Spleen: Normal Adrenals/Urinary Tract: Adrenal glands are normal. Small for a age, measuring only 9.4 cm in length on the left and 9.3 cm in length on the right. No evidence of cyst, stone, mass or hydronephrosis. No stone seen in either ureter. No stone in the bladder. Stomach/Bowel: Normal. No evidence of obstruction or inflammatory disease. Normal appearing appendix. Vascular/Lymphatic: Aorta and IVC are normal. No retroperitoneal adenopathy. Reproductive: Normal Other: No free fluid or air. Musculoskeletal: Negative IMPRESSION: No cause of acute flank pain is identified. No evidence of renal stone disease. The kidneys are small for age measuring just over 9 cm, probably relating to the patient's history of diabetes and hypertension. No focal renal lesion. Electronically Signed   By: Nelson Chimes M.D.   On: 04/29/2019 16:41   US Scrotum W/doppler  Result Date: 04/29/2019 CLINICAL DATA:  Right groin swelling EXAM: SCROTAL ULTRASOUND DOPPLER ULTRASOUND OF THE TESTICLES TECHNIQUE: Complete ultrasound examination of the testicles, epididymis, and other scrotal structures was performed. Color and spectral Doppler ultrasound were also utilized to evaluate blood flow to the testicles. COMPARISON:  CT 04/29/2019 FINDINGS: Right testicle Measurements: 3.6 x 2.6 x 3 cm. No mass or microlithiasis visualized. Left testicle Measurements: 3 x 3.1 x 2.1 cm. Heterogenous echotexture without discrete mass. Right epididymis:  Small septated cyst measuring 7 mm. Left epididymis: Diffuse enlargement  of the epididymal head without a mass. Hydrocele:  Small bilateral hydroceles. Varicocele:  Small right varicocele. Pulsed Doppler interrogation of both testes demonstrates normal low resistance arterial and venous waveforms bilaterally. IMPRESSION: 1. No definite evidence for testicular torsion. 2. Heterogenous appearing left testis, could be secondary to prior insults. No discrete mass lesion. Mild diffuse enlargement of the left epididymal head without a mass, is questionable for resolving inflammatory change 3. Small bilateral hydroceles.  Small right varicocele Electronically Signed   By: Donavan Foil M.D.   On: 04/29/2019 16:57    ____________________________________________   PROCEDURES  Procedure(s) performed:   Procedures  None  ____________________________________________   INITIAL IMPRESSION / ASSESSMENT AND PLAN / ED COURSE  Pertinent labs & imaging results that were available during my care of the patient were reviewed by me and considered in my medical decision making (see chart for details).   Patient describes a right inguinal and right testicle discomfort.  He has had intermittent hematuria.  Plan for CT renal to rule out kidney stone.  No flank pain or fever.  UA shows possible infection.  Patient has been abstinent for the last 2 years and has no concern for STD.  Plan for CT renal along with testicular ultrasound with Doppler to evaluate for  torsion or cyst epididymitis.   CT imaging, ultrasound, UA reviewed.  No hernia or ureterolithiasis.  Ultrasound shows heterogeneous signal in the right testicle correlating with the area of patient's pain.  That, in conjunction with his UA, plan to cover for possible developing orchitis.  No visible abscess.  Patient does have hydrocele noted as well.  I discussed this with the patient.  I advised that we start him on Levaquin for the next 10 days as the patient does not have concern for STD or recent exposure.  I advised that he  follow-up with the urologist.  We discussed the side effect profile of Levaquin including spontaneous tendon rupture.  Discussed that if any of the symptoms develop he should stop the antibiotic immediately and return to the emergency department.  Also provided contact information for his PCP.  Patient comfortable with the plan at discharge.  ____________________________________________  FINAL CLINICAL IMPRESSION(S) / ED DIAGNOSES  Final diagnoses:  Pain in right testicle    NEW OUTPATIENT MEDICATIONS STARTED DURING THIS VISIT:  New Prescriptions   LEVOFLOXACIN (LEVAQUIN) 500 MG TABLET    Take 1 tablet (500 mg total) by mouth daily for 10 days.    Note:  This document was prepared using Dragon voice recognition software and may include unintentional dictation errors.  Nanda Quinton, MD Emergency Medicine    Jarrett Chicoine, [facility], MD 04/29/19 [phone]

## 2019-04-30 LAB — URINE CULTURE
Culture: NO GROWTH
Special Requests: NORMAL

## 2019-05-20 ENCOUNTER — Telehealth: Payer: Self-pay | Admitting: Emergency Medicine

## 2019-05-20 ENCOUNTER — Other Ambulatory Visit: Payer: Self-pay

## 2019-05-20 ENCOUNTER — Ambulatory Visit (INDEPENDENT_AMBULATORY_CARE_PROVIDER_SITE_OTHER): Payer: Medicaid Other | Admitting: Nurse Practitioner

## 2019-05-20 ENCOUNTER — Telehealth: Payer: Medicaid Other | Admitting: Nurse Practitioner

## 2019-05-20 DIAGNOSIS — J45901 Unspecified asthma with (acute) exacerbation: Secondary | ICD-10-CM

## 2019-05-20 MED ORDER — AZITHROMYCIN 250 MG PO TABS: ORAL_TABLET | ORAL | 0 refills | Status: DC

## 2019-05-20 MED ORDER — PREDNISONE 10 MG PO TABS: ORAL_TABLET | ORAL | 0 refills | Status: DC

## 2019-05-20 NOTE — Telephone Encounter (Signed)
Primary Pulmonologist: RB Last office visit and with whom: 02/27/18 with RB What do we see them for (pulmonary problems): asthma Last OV assessment/plan: Instructions   Continue your Symbicort 2 puffs once a day in the morning.  Remember to rinse and gargle after you use this medication. Do not use your Symbicort as a rescue medication.  There may be some danger associated with this. We will refill your pro-air.  Use this medication 2 puffs up to every 4 hours if needed for shortness of breath, wheezing, chest tightness. Flu shot today. You may want to try using loratadine 10 mg daily if needed for congestion and allergy symptoms. Follow with Dr Lamonte Sakai in 9 months or sooner if you have any problems      Was appointment offered to patient (explain)?  Pt wants recommendations   Reason for call: Called and spoke with pt who stated he began having complaints of chest congestion and body aches for about a week now. Pt denies any fever that he knows of as he went to doctor yesterday and was told the temp was 98 but states that he does have chills. Pt is coughing up clear mucus.  Pt has taken Aleve to see if it would help with his symptoms and pt stated that did give him some relief with the aches in his joints. Pt is taking Symbicort as prescribed and has not had to use his rescue inhaler.  Pt has not done any recent travel and has not been around anyone that has either been sick or exposed to [place]. Pt wants to know what we recommend to help with his symptoms. Aaron Edelman, please advise on this for pt. Thanks!

## 2019-05-20 NOTE — Telephone Encounter (Signed)
Patient is returning phone call.  Patient phone number is [phone].

## 2019-05-20 NOTE — Telephone Encounter (Signed)
Left message for patient to call back  

## 2019-05-20 NOTE — Telephone Encounter (Signed)
What doctors office that he go to yesterday?  He states he did not go to the doctor yesterday.   Did he discuss the symptoms with the provider there? No  Is the patient wheezing?  He is not wheezing but his body is achy and his joints are achy. I s he producing more sputum than usual? No he has been taking his Symbicort inhaler and hasnt been an issue

## 2019-05-20 NOTE — Patient Instructions (Addendum)
Continue Symbicort Continue albuterol as needed May start Claritin daily Will order prednisone taper Will order azithromycin May take tylenol as needed for generalized body aches  Follow up: Follow up with Dr. Lamonte Sakai in 3 months or sooner if needed Please call back if symptoms worsen or fever develops  Coronavirus (COVID-19) Are you at risk?  Are you at risk for the Coronavirus (COVID-19)?  To be considered HIGH RISK for Coronavirus (COVID-19), you have to meet the following criteria:  . Traveled to [place], [place], [place], [place] or [place]; or in the [place] to [place], [employer] [place], [place], or [place]; and have fever, cough, and shortness of breath within the last 2 weeks of travel OR . Been in close contact with a person diagnosed with COVID-19 within the last 2 weeks and have fever, cough, and shortness of breath . IF YOU DO NOT MEET THESE CRITERIA, YOU ARE CONSIDERED LOW RISK FOR COVID-19.  What to do if you are HIGH RISK for COVID-19?  Marland Kitchen If you are having a medical emergency, call 911. . Seek medical care right away. Before you go to a doctor's office, urgent care or emergency department, call ahead and tell them about your recent travel, contact with someone diagnosed with COVID-19, and your symptoms. You should receive instructions from your physician's office regarding next steps of care.  . When you arrive at healthcare provider, tell the healthcare staff immediately you have returned from visiting [place], [place], [place], [place] or [place]; or traveled in the [place] to [place], [place], [place], or [place]; in the last two weeks or you have been in close contact with a person diagnosed with COVID-19 in the last 2 weeks.   . Tell the health care staff about your symptoms: fever, cough and shortness of breath. . After you have been seen by a medical provider, you will be either: o Tested for (COVID-19) and discharged home on quarantine except to seek  medical care if symptoms worsen, and asked to  - Stay home and avoid contact with others until you get your results (4-5 days)  - Avoid travel on public transportation if possible (such as bus, train, or airplane) or o Sent to the Emergency Department by EMS for evaluation, COVID-19 testing, and possible admission depending on your condition and test results.  What to do if you are LOW RISK for COVID-19?  Reduce your risk of any infection by using the same precautions used for avoiding the common cold or flu:  Marland Kitchen Wash your hands often with soap and warm water for at least 20 seconds.  If soap and water are not readily available, use an alcohol-based hand sanitizer with at least 60% alcohol.  . If coughing or sneezing, cover your mouth and nose by coughing or sneezing into the elbow areas of your shirt or coat, into a tissue or into your sleeve (not your hands). . Avoid shaking hands with others and consider head nods or verbal greetings only. . Avoid touching your eyes, nose, or mouth with unwashed hands.  . Avoid close contact with people who are sick. . Avoid places or events with large numbers of people in one location, like concerts or sporting events. . Carefully consider travel plans you have or are making. . If you are planning any travel outside or inside the [place], visit the CDC's Travelers' Health webpage for the latest health notices. . If you have some symptoms but not all symptoms, continue to  monitor at home and seek medical attention if your symptoms worsen. . If you are having a medical emergency, call 911.   [place] / e-Visit: [email or web address]         [facility]: [facility]: [phone]                   [facility]: [phone]

## 2019-05-20 NOTE — Progress Notes (Signed)
Virtual Visit via Telephone Note  I connected with Walter Harmon on 05/21/19 at  4:15 PM EDT by telephone and verified that I am speaking with the correct person using two identifiers.  Location: Patient: home Provider: office   I discussed the limitations, risks, security and privacy concerns of performing an evaluation and management service by telephone and the availability of in person appointments. I also discussed with the patient that there may be a patient responsible charge related to this service. The patient expressed understanding and agreed to proceed.   History of Present Illness: 53 year old male never smoker with asthma allergic rhinitis who is followed by Dr. Lamonte Sakai. PMH includes diabetes and chronic kidney disease. Maintenance: Symbicort  Patient has a tele-visit today for an acute visit.  Patient complains of chest congestion and body aches for 1 week.  States that symptoms are progressively worsening.  He denies any recent fever.  Patient states that he has a slight cough with clear mucus.  He denies any significant wheeze.  He denies being exposed anyone who is sick or has COVID.  He does still work.  He states that he does wear a mask at work.  He states that he is compliant with Symbicort and has not used his rescue inhaler.  He is not currently taking an antihistamine. Denies f/c/s, n/v/d, hemoptysis, PND, leg swelling.     Observations/Objective:  CXR 2015 -no active cardiopulmonary disease   Assessment and Plan: Asthma exacerbation/URI:  Patient complains of chest congestion and body aches for 1 week.  States that symptoms are progressively worsening.  He denies any recent fever.  Patient states that he has a slight cough with clear mucus.  He denies any significant wheeze.  He denies being exposed anyone who is sick or has COVID.  He does still work.  He states that he does wear a mask at work.  He states that he is compliant with Symbicort and has not used  his rescue inhaler.  He is not currently taking an antihistamine.  Patient Instructions  Continue Symbicort Continue albuterol as needed May start Claritin daily Will order prednisone taper Will order azithromycin May take tylenol as needed for generalized body aches  Follow up: Follow up with Dr. Lamonte Sakai in 3 months or sooner if needed Please call back if symptoms worsen or fever develops  Coronavirus (COVID-19) Are you at risk?  Are you at risk for the Coronavirus (COVID-19)?  To be considered HIGH RISK for Coronavirus (COVID-19), you have to meet the following criteria:  . Traveled to [place], [place], [place], [place] or [place]; or in the [place] to [place], [place], [place], or [place]; and have fever, cough, and shortness of breath within the last 2 weeks of travel OR . Been in close contact with a person diagnosed with COVID-19 within the last 2 weeks and have fever, cough, and shortness of breath . IF YOU DO NOT MEET THESE CRITERIA, YOU ARE CONSIDERED LOW RISK FOR COVID-19.  What to do if you are HIGH RISK for COVID-19?  Marland Kitchen If you are having a medical emergency, call 911. . Seek medical care right away. Before you go to a doctor's office, urgent care or emergency department, call ahead and tell them about your recent travel, contact with someone diagnosed with COVID-19, and your symptoms. You should receive instructions from your physician's office regarding next steps of care.  . When you arrive at healthcare provider, tell the healthcare staff immediately you  have returned from visiting [place], [place], [place], [place] or [place]; or traveled in the [place] to [place], [place], [place], or [place]; in the last two weeks or you have been in close contact with a person diagnosed with COVID-19 in the last 2 weeks.   . Tell the health care staff about your symptoms: fever, cough and shortness of breath. . After you have been seen by a medical provider, you  will be either: o Tested for (COVID-19) and discharged home on quarantine except to seek medical care if symptoms worsen, and asked to  - Stay home and avoid contact with others until you get your results (4-5 days)  - Avoid travel on public transportation if possible (such as bus, train, or airplane) or o Sent to the Emergency Department by EMS for evaluation, COVID-19 testing, and possible admission depending on your condition and test results.  What to do if you are LOW RISK for COVID-19?  Reduce your risk of any infection by using the same precautions used for avoiding the common cold or flu:  Marland Kitchen Wash your hands often with soap and warm water for at least 20 seconds.  If soap and water are not readily available, use an alcohol-based hand sanitizer with at least 60% alcohol.  . If coughing or sneezing, cover your mouth and nose by coughing or sneezing into the elbow areas of your shirt or coat, into a tissue or into your sleeve (not your hands). . Avoid shaking hands with others and consider head nods or verbal greetings only. . Avoid touching your eyes, nose, or mouth with unwashed hands.  . Avoid close contact with people who are sick. . Avoid places or events with large numbers of people in one location, like concerts or sporting events. . Carefully consider travel plans you have or are making. . If you are planning any travel outside or inside the [place], visit the CDC's Travelers' Health webpage for the latest health notices. . If you have some symptoms but not all symptoms, continue to monitor at home and seek medical attention if your symptoms worsen. . If you are having a medical emergency, call 911.   [place] / e-Visit: [email or web address]         [facility]: [facility]: [phone]                   [facility]: [phone]       Follow Up Instructions: Follow up: Follow up with Dr. Lamonte Sakai in 3 months or sooner if needed Please call back if symptoms worsen or fever develops    I discussed the assessment and treatment plan with the patient. The patient was provided an opportunity to ask questions and all were answered. The patient agreed with the plan and demonstrated an understanding of the instructions.   The patient was advised to call back or seek an in-person evaluation if the symptoms worsen or if the condition fails to improve as anticipated.  I provided 22 minutes of non-face-to-face time during this encounter.   Fenton Foy, NP

## 2019-05-20 NOTE — Telephone Encounter (Signed)
Patient would like further evaluation please offer him a video visit or a telephone visit.  I would prefer video visit.  It can be set up with any APP.  If patient does not want to proceed forward with an appointment with our office I would recommend that he follows up with primary care.  Walter Quaker, FNP

## 2019-05-20 NOTE — Telephone Encounter (Signed)
Called and spoke with pt letting him know that we needed him to have video visit to further evaluate symptoms and pt expressed understanding. Pt has been scheduled for mychart video visit today at 4:30 with [place]. Nothing further needed.

## 2019-05-20 NOTE — Telephone Encounter (Signed)
I am confused initial triage notes specifically stated patient went to the doctor's office yesterday and was afebrile.  Is the patient febrile?  The patient did not go to the doctor's office yesterday than how do we know what his temperature is?  It sounds like the patient needs to be further evaluated.  I would recommend getting set up with an APP for an appointment.  Wyn Quaker, FNP

## 2019-05-20 NOTE — Telephone Encounter (Signed)
Sorry to hear the patient is not feeling well.  What doctors office that he go to yesterday?  Did he discuss the symptoms with the provider there?  Is the patient wheezing?  Is he producing more sputum than usual?  Wyn Quaker, FNP

## 2019-05-20 NOTE — Telephone Encounter (Signed)
Called pt to get clarification from previous call made to pt in regards to where he said he had gone to the doctor and after clarifying with pt, pt stated that he did not go to the doctor that the vice president at the [employer] which is pt's employment took his temp and it was 98 degrees when it was taken yesterday 05/19/2019.  Asked pt if his temp had been taken today and pt stated that it had not due to him being at home today and does not have a thermometer that he can take his temp with.  Pt has been afebrile. Pt has body aches but states it is achy joints and has been taking aleve to help with that. Pt denies any complaints of wheezing. Pt is taking his daily symbicort inhaler.

## 2019-05-21 ENCOUNTER — Encounter: Payer: Self-pay | Admitting: Nurse Practitioner

## 2019-05-21 DIAGNOSIS — J45901 Unspecified asthma with (acute) exacerbation: Secondary | ICD-10-CM | POA: Insufficient documentation

## 2019-05-21 NOTE — Assessment & Plan Note (Signed)
Asthma exacerbation/URI:  Patient complains of chest congestion and body aches for 1 week.  States that symptoms are progressively worsening.  He denies any recent fever.  Patient states that he has a slight cough with clear mucus.  He denies any significant wheeze.  He denies being exposed anyone who is sick or has COVID.  He does still work.  He states that he does wear a mask at work.  He states that he is compliant with Symbicort and has not used his rescue inhaler.  He is not currently taking an antihistamine.  Patient Instructions  Continue Symbicort Continue albuterol as needed May start Claritin daily Will order prednisone taper Will order azithromycin May take tylenol as needed for generalized body aches  Follow up: Follow up with Dr. Lamonte Sakai in 3 months or sooner if needed Please call back if symptoms worsen or fever develops  Coronavirus (COVID-19) Are you at risk?  Are you at risk for the Coronavirus (COVID-19)?  To be considered HIGH RISK for Coronavirus (COVID-19), you have to meet the following criteria:  . Traveled to [place], [place], [place], [place] or [place]; or in the [place] to [place], [place], [place], or [place]; and have fever, cough, and shortness of breath within the last 2 weeks of travel OR . Been in close contact with a person diagnosed with COVID-19 within the last 2 weeks and have fever, cough, and shortness of breath . IF YOU DO NOT MEET THESE CRITERIA, YOU ARE CONSIDERED LOW RISK FOR COVID-19.  What to do if you are HIGH RISK for COVID-19?  Marland Kitchen If you are having a medical emergency, call 911. . Seek medical care right away. Before you go to a doctor's office, urgent care or emergency department, call ahead and tell them about your recent travel, contact with someone diagnosed with COVID-19, and your symptoms. You should receive instructions from your physician's office regarding next steps of care.  . When you arrive at healthcare provider,  tell the healthcare staff immediately you have returned from visiting [place], [place], [place], [place] or [place]; or traveled in the [place] to [place], [place], [place], or [place]; in the last two weeks or you have been in close contact with a person diagnosed with COVID-19 in the last 2 weeks.   . Tell the health care staff about your symptoms: fever, cough and shortness of breath. . After you have been seen by a medical provider, you will be either: o Tested for (COVID-19) and discharged home on quarantine except to seek medical care if symptoms worsen, and asked to  - Stay home and avoid contact with others until you get your results (4-5 days)  - Avoid travel on public transportation if possible (such as bus, train, or airplane) or o Sent to the Emergency Department by EMS for evaluation, COVID-19 testing, and possible admission depending on your condition and test results.  What to do if you are LOW RISK for COVID-19?  Reduce your risk of any infection by using the same precautions used for avoiding the common cold or flu:  Marland Kitchen Wash your hands often with soap and warm water for at least 20 seconds.  If soap and water are not readily available, use an alcohol-based hand sanitizer with at least 60% alcohol.  . If coughing or sneezing, cover your mouth and nose by coughing or sneezing into the elbow areas of your shirt or coat, into a tissue or into your sleeve (  not your hands). . Avoid shaking hands with others and consider head nods or verbal greetings only. . Avoid touching your eyes, nose, or mouth with unwashed hands.  . Avoid close contact with people who are sick. . Avoid places or events with large numbers of people in one location, like concerts or sporting events. . Carefully consider travel plans you have or are making. . If you are planning any travel outside or inside the [place], visit the CDC's Travelers' Health webpage for the latest health notices. . If you have some  symptoms but not all symptoms, continue to monitor at home and seek medical attention if your symptoms worsen. . If you are having a medical emergency, call 911.   [place] / e-Visit: [email or web address]         [facility]: [facility]: [phone]                   [facility]: [phone]

## 2019-05-24 ENCOUNTER — Observation Stay (HOSPITAL_COMMUNITY)
Admission: EM | Admit: 2019-05-24 | Discharge: 2019-05-26 | Disposition: A | Discharge status: Home / Self Care | Payer: Self-pay | Attending: Cardiovascular Disease | Admitting: Cardiovascular Disease

## 2019-05-24 ENCOUNTER — Other Ambulatory Visit: Payer: Self-pay

## 2019-05-24 ENCOUNTER — Encounter (HOSPITAL_COMMUNITY): Payer: Self-pay | Admitting: Emergency Medicine

## 2019-05-24 ENCOUNTER — Emergency Department (HOSPITAL_COMMUNITY): Payer: Medicaid Other

## 2019-05-24 ENCOUNTER — Observation Stay (HOSPITAL_COMMUNITY): Payer: Medicaid Other

## 2019-05-24 DIAGNOSIS — R079 Chest pain, unspecified: Secondary | ICD-10-CM

## 2019-05-24 DIAGNOSIS — R9431 Abnormal electrocardiogram [ECG] [EKG]: Secondary | ICD-10-CM | POA: Insufficient documentation

## 2019-05-24 DIAGNOSIS — I213 ST elevation (STEMI) myocardial infarction of unspecified site: Secondary | ICD-10-CM | POA: Insufficient documentation

## 2019-05-24 DIAGNOSIS — N179 Acute kidney failure, unspecified: Secondary | ICD-10-CM | POA: Insufficient documentation

## 2019-05-24 DIAGNOSIS — U071 COVID-19: Secondary | ICD-10-CM | POA: Insufficient documentation

## 2019-05-24 DIAGNOSIS — B342 Coronavirus infection, unspecified: Secondary | ICD-10-CM

## 2019-05-24 DIAGNOSIS — R918 Other nonspecific abnormal finding of lung field: Secondary | ICD-10-CM | POA: Insufficient documentation

## 2019-05-24 DIAGNOSIS — R066 Hiccough: Secondary | ICD-10-CM | POA: Insufficient documentation

## 2019-05-24 DIAGNOSIS — J45909 Unspecified asthma, uncomplicated: Secondary | ICD-10-CM | POA: Insufficient documentation

## 2019-05-24 DIAGNOSIS — I700 Atherosclerosis of aorta: Secondary | ICD-10-CM | POA: Insufficient documentation

## 2019-05-24 DIAGNOSIS — Z7951 Long term (current) use of inhaled steroids: Secondary | ICD-10-CM | POA: Insufficient documentation

## 2019-05-24 DIAGNOSIS — Z79899 Other long term (current) drug therapy: Secondary | ICD-10-CM | POA: Insufficient documentation

## 2019-05-24 DIAGNOSIS — I249 Acute ischemic heart disease, unspecified: Principal | ICD-10-CM | POA: Insufficient documentation

## 2019-05-24 DIAGNOSIS — Z7952 Long term (current) use of systemic steroids: Secondary | ICD-10-CM | POA: Insufficient documentation

## 2019-05-24 DIAGNOSIS — Z9114 Patient's other noncompliance with medication regimen: Secondary | ICD-10-CM | POA: Insufficient documentation

## 2019-05-24 DIAGNOSIS — I129 Hypertensive chronic kidney disease with stage 1 through stage 4 chronic kidney disease, or unspecified chronic kidney disease: Secondary | ICD-10-CM | POA: Insufficient documentation

## 2019-05-24 DIAGNOSIS — N184 Chronic kidney disease, stage 4 (severe): Secondary | ICD-10-CM | POA: Insufficient documentation

## 2019-05-24 DIAGNOSIS — E1122 Type 2 diabetes mellitus with diabetic chronic kidney disease: Secondary | ICD-10-CM | POA: Insufficient documentation

## 2019-05-24 LAB — HEPARIN LEVEL (UNFRACTIONATED): Heparin Unfractionated: 0.18 IU/mL — ABNORMAL LOW (ref 0.30–0.70)

## 2019-05-24 LAB — CBC
HCT: 44.7 % (ref 39.0–52.0)
Hemoglobin: 15.3 g/dL (ref 13.0–17.0)
MCH: 30.6 pg (ref 26.0–34.0)
MCHC: 34.2 g/dL (ref 30.0–36.0)
MCV: 89.4 fL (ref 80.0–100.0)
Platelets: 272 10*3/uL (ref 150–400)
RBC: 5 MIL/uL (ref 4.22–5.81)
RDW: 11.9 % (ref 11.5–15.5)
WBC: 10.1 10*3/uL (ref 4.0–10.5)
nRBC: 0 % (ref 0.0–0.2)

## 2019-05-24 LAB — ECHOCARDIOGRAM COMPLETE
Height: 65 in
Weight: 2489.6 oz

## 2019-05-24 LAB — LIPID PANEL
Cholesterol: 203 mg/dL — ABNORMAL HIGH (ref 0–200)
HDL: 22 mg/dL — ABNORMAL LOW (ref >40)
LDL Cholesterol: 143 mg/dL — ABNORMAL HIGH (ref 0–99)
Total CHOL/HDL Ratio: 9.2 RATIO
Triglycerides: 190 mg/dL — ABNORMAL HIGH (ref <150)
VLDL: 38 mg/dL (ref 0–40)

## 2019-05-24 LAB — GLUCOSE, CAPILLARY
Glucose-Capillary: 332 mg/dL — ABNORMAL HIGH (ref 70–99)
Glucose-Capillary: 182 mg/dL — ABNORMAL HIGH (ref 70–99)
Glucose-Capillary: 281 mg/dL — ABNORMAL HIGH (ref 70–99)

## 2019-05-24 LAB — SARS CORONAVIRUS 2 BY RT PCR (HOSPITAL ORDER, PERFORMED IN ~~LOC~~ HOSPITAL LAB): SARS Coronavirus 2: POSITIVE — AB

## 2019-05-24 LAB — BASIC METABOLIC PANEL
Anion gap: 15 (ref 5–15)
BUN: 50 mg/dL — ABNORMAL HIGH (ref 6–20)
CO2: 17 mmol/L — ABNORMAL LOW (ref 22–32)
Calcium: 9.4 mg/dL (ref 8.9–10.3)
Chloride: 96 mmol/L — ABNORMAL LOW (ref 98–111)
Creatinine, Ser: 4.05 mg/dL — ABNORMAL HIGH (ref 0.61–1.24)
GFR calc Af Amer: 19 mL/min — ABNORMAL LOW (ref >60)
GFR calc non Af Amer: 16 mL/min — ABNORMAL LOW (ref >60)
Glucose, Bld: 424 mg/dL — ABNORMAL HIGH (ref 70–99)
Potassium: 5.1 mmol/L (ref 3.5–5.1)
Sodium: 128 mmol/L — ABNORMAL LOW (ref 135–145)

## 2019-05-24 LAB — HEMOGLOBIN A1C
Hgb A1c MFr Bld: 10 % — ABNORMAL HIGH (ref 4.8–5.6)
Mean Plasma Glucose: 240.3 mg/dL

## 2019-05-24 LAB — TROPONIN I
Troponin I: <0.03 ng/mL (ref <0.03)
Troponin I: <0.03 ng/mL (ref <0.03)
Troponin I: <0.03 ng/mL (ref <0.03)
Troponin I: <0.03 ng/mL (ref <0.03)

## 2019-05-24 LAB — URIC ACID: Uric Acid, Serum: 9.5 mg/dL — ABNORMAL HIGH (ref 3.7–8.6)

## 2019-05-24 MED ORDER — ACETAMINOPHEN 325 MG PO TABS
650.0000 mg | ORAL_TABLET | ORAL | Status: DC | PRN
  Administered 2019-05-24 – 2019-05-26 (×8): 650 mg via ORAL
  Filled 2019-05-24 (×7): qty 2

## 2019-05-24 MED ORDER — BISACODYL 5 MG PO TBEC
5.0000 mg | DELAYED_RELEASE_TABLET | Freq: Every day | ORAL | Status: DC | PRN
  Administered 2019-05-24: 5 mg via ORAL
  Filled 2019-05-24: qty 1

## 2019-05-24 MED ORDER — SODIUM CHLORIDE 0.9% FLUSH: 3.0000 mL | Freq: Once | INTRAVENOUS | Status: DC

## 2019-05-24 MED ORDER — PANTOPRAZOLE SODIUM 40 MG IV SOLR
40.0000 mg | Freq: Once | INTRAVENOUS | Status: AC
  Administered 2019-05-24: 40 mg via INTRAVENOUS
  Filled 2019-05-24: qty 40

## 2019-05-24 MED ORDER — HEPARIN (PORCINE) 25000 UT/250ML-% IV SOLN
1150.0000 [IU]/h | INTRAVENOUS | Status: DC
  Administered 2019-05-24: 900 [IU]/h via INTRAVENOUS
  Filled 2019-05-24: qty 250

## 2019-05-24 MED ORDER — ALBUTEROL SULFATE (2.5 MG/3ML) 0.083% IN NEBU: 3.0000 mL | INHALATION_SOLUTION | Freq: Four times a day (QID) | RESPIRATORY_TRACT | Status: DC | PRN

## 2019-05-24 MED ORDER — HEPARIN BOLUS VIA INFUSION
3500.0000 [IU] | Freq: Once | INTRAVENOUS | Status: AC
  Administered 2019-05-24: 3500 [IU] via INTRAVENOUS
  Filled 2019-05-24: qty 3500

## 2019-05-24 MED ORDER — ASPIRIN EC 81 MG PO TBEC
81.0000 mg | DELAYED_RELEASE_TABLET | Freq: Every day | ORAL | Status: DC
  Administered 2019-05-25: 81 mg via ORAL
  Filled 2019-05-24: qty 1

## 2019-05-24 MED ORDER — ASPIRIN 300 MG RE SUPP: 300.0000 mg | RECTAL | Status: AC

## 2019-05-24 MED ORDER — ALBUTEROL SULFATE HFA 108 (90 BASE) MCG/ACT IN AERS
2.0000 | INHALATION_SPRAY | Freq: Four times a day (QID) | RESPIRATORY_TRACT | Status: DC | PRN
  Filled 2019-05-24: qty 6.7

## 2019-05-24 MED ORDER — METOCLOPRAMIDE HCL 5 MG/ML IJ SOLN
10.0000 mg | Freq: Once | INTRAMUSCULAR | Status: AC
  Administered 2019-05-24: 10 mg via INTRAVENOUS
  Filled 2019-05-24: qty 2

## 2019-05-24 MED ORDER — METOCLOPRAMIDE HCL 10 MG PO TABS
10.0000 mg | ORAL_TABLET | Freq: Once | ORAL | Status: AC
  Administered 2019-05-24: 10 mg via ORAL
  Filled 2019-05-24: qty 1

## 2019-05-24 MED ORDER — ASPIRIN 81 MG PO CHEW: 324.0000 mg | CHEWABLE_TABLET | ORAL | Status: AC

## 2019-05-24 MED ORDER — ASPIRIN 81 MG PO CHEW
324.0000 mg | CHEWABLE_TABLET | Freq: Once | ORAL | Status: AC
  Administered 2019-05-24: 324 mg via ORAL
  Filled 2019-05-24: qty 4

## 2019-05-24 MED ORDER — SODIUM CHLORIDE 0.9 % IV SOLN
INTRAVENOUS | Status: DC
  Administered 2019-05-24 – 2019-05-26 (×3): via INTRAVENOUS

## 2019-05-24 MED ORDER — HYDRALAZINE HCL 25 MG PO TABS
25.0000 mg | ORAL_TABLET | Freq: Three times a day (TID) | ORAL | Status: DC
  Administered 2019-05-24 – 2019-05-26 (×6): 25 mg via ORAL
  Filled 2019-05-24 (×6): qty 1

## 2019-05-24 MED ORDER — MOMETASONE FURO-FORMOTEROL FUM 200-5 MCG/ACT IN AERO
2.0000 | INHALATION_SPRAY | Freq: Two times a day (BID) | RESPIRATORY_TRACT | Status: DC
  Administered 2019-05-25 (×2): 2 via RESPIRATORY_TRACT
  Filled 2019-05-24 (×2): qty 8.8

## 2019-05-24 MED ORDER — INSULIN ASPART 100 UNIT/ML ~~LOC~~ SOLN
0.0000 [IU] | Freq: Three times a day (TID) | SUBCUTANEOUS | Status: DC
  Administered 2019-05-24: 11 [IU] via SUBCUTANEOUS
  Administered 2019-05-24: 3 [IU] via SUBCUTANEOUS
  Administered 2019-05-25 (×2): 11 [IU] via SUBCUTANEOUS
  Administered 2019-05-25: 3 [IU] via SUBCUTANEOUS

## 2019-05-24 MED ORDER — AMLODIPINE BESYLATE 5 MG PO TABS
5.0000 mg | ORAL_TABLET | Freq: Every day | ORAL | Status: DC
  Administered 2019-05-24 – 2019-05-25 (×2): 5 mg via ORAL
  Filled 2019-05-24 (×2): qty 1

## 2019-05-24 MED ORDER — HEPARIN SODIUM (PORCINE) 5000 UNIT/ML IJ SOLN
5000.0000 [IU] | Freq: Three times a day (TID) | INTRAMUSCULAR | Status: DC
  Administered 2019-05-24 – 2019-05-26 (×5): 5000 [IU] via SUBCUTANEOUS
  Filled 2019-05-24 (×5): qty 1

## 2019-05-24 MED ORDER — ONDANSETRON HCL 4 MG/2ML IJ SOLN: 4.0000 mg | Freq: Four times a day (QID) | INTRAMUSCULAR | Status: DC | PRN

## 2019-05-24 MED ORDER — ATORVASTATIN CALCIUM 40 MG PO TABS
40.0000 mg | ORAL_TABLET | Freq: Every day | ORAL | Status: DC
  Administered 2019-05-24 – 2019-05-25 (×2): 40 mg via ORAL
  Filled 2019-05-24 (×2): qty 1

## 2019-05-24 MED ORDER — NITROGLYCERIN 0.4 MG SL SUBL: 0.4000 mg | SUBLINGUAL_TABLET | SUBLINGUAL | Status: DC | PRN

## 2019-05-24 NOTE — Progress Notes (Signed)
Ref: Patient, No Pcp Per   Subjective:  Troponin I negative x 2. EKG remains unchanged. Patient understood low salt diet and taking medications for hypertension and type 2 DM. VS stable. Denies fever, chest pain or cough. BP control is improving and now on sliding scale insulin.  Objective:  Vital Signs in the last 24 hours: Temp:  [97.6 F (36.4 C)-97.9 F (36.6 C)] 97.6 F (36.4 C) (06/01 0843) Pulse Rate:  [80-100] 84 (06/01 0520) Cardiac Rhythm: Normal sinus rhythm (06/01 0821) Resp:  [14-24] 18 (06/01 0843) BP: (118-159)/(93-111) 148/98 (06/01 0843) SpO2:  [93 %-100 %] 100 % (06/01 0843) Weight:  [70.6 kg] 70.6 kg (06/01 0520)  Physical Exam: BP Readings from Last 1 Encounters:  05/24/19 (!) 148/98     Wt Readings from Last 1 Encounters:  05/24/19 70.6 kg    Weight change:  Body mass index is 25.89 kg/m. HEENT: [place]/AT, Eyes-Brown, PERL, EOMI, Conjunctiva-Pink, Sclera-Non-icteric Neck: No JVD, No bruit, Trachea midline. Lungs:  Clearing, Bilateral. Cardiac:  Regular rhythm, normal S1 and S2, no S3. II/VI systolic murmur. Abdomen:  Soft, non-tender. BS present. Extremities:  No edema present. No cyanosis. No clubbing. CNS: AxOx3, Cranial nerves grossly intact, moves all 4 extremities.  Skin: Warm and dry.   Intake/Output from previous day: No intake/output data recorded.    Lab Results: BMET    Component Value Date/Time   NA 128 (L) 05/24/2019 0248   NA 137 05/30/2014 0259   NA 135 (L) 01/26/2014 0308   K 5.1 05/24/2019 0248   K 5.8 (H) 05/30/2014 0259   K 4.9 01/26/2014 0308   CL 96 (L) 05/24/2019 0248   CL 101 05/30/2014 0259   CL 100 01/26/2014 0308   CO2 17 (L) 05/24/2019 0248   CO2 26 05/30/2014 0259   CO2 20 01/26/2014 0308   GLUCOSE 424 (H) 05/24/2019 0248   GLUCOSE 206 (H) 05/30/2014 0259   GLUCOSE 169 (H) 01/26/2014 0308   BUN 50 (H) 05/24/2019 0248   BUN 27 (H) 05/30/2014 0259   BUN 33 (H) 01/26/2014 0308   CREATININE 4.05 (H) 05/24/2019  0248   CREATININE 2.61 (H) 05/30/2014 0259   CREATININE 2.15 (H) 01/26/2014 0308   CALCIUM 9.4 05/24/2019 0248   CALCIUM 9.5 05/30/2014 0259   CALCIUM 8.8 01/26/2014 0308   GFRNONAA 16 (L) 05/24/2019 0248   GFRNONAA 28 (L) 05/30/2014 0259   GFRNONAA 35 (L) 01/26/2014 0308   GFRAA 19 (L) 05/24/2019 0248   GFRAA 32 (L) 05/30/2014 0259   GFRAA 41 (L) 01/26/2014 0308   CBC    Component Value Date/Time   WBC 10.1 05/24/2019 0248   RBC 5.00 05/24/2019 0248   HGB 15.3 05/24/2019 0248   HCT 44.7 05/24/2019 0248   PLT 272 05/24/2019 0248   MCV 89.4 05/24/2019 0248   MCH 30.6 05/24/2019 0248   MCHC 34.2 05/24/2019 0248   RDW 11.9 05/24/2019 0248   LYMPHSABS 1.2 05/30/2014 0259   MONOABS 0.9 05/30/2014 0259   EOSABS 0.1 05/30/2014 0259   BASOSABS 0.0 05/30/2014 0259   HEPATIC Function Panel No results for input(s): PROT in the last 8760 hours.  Invalid input(s):  ALBUMIN,  AST,  ALT,  ALKPHOS,  BILIDIR,  IBILI HEMOGLOBIN A1C No components found for: HGA1C,  MPG CARDIAC ENZYMES Lab Results  Component Value Date   CKTOTAL 348 (H) 08/10/2012   CKMB 4.0 08/10/2012   TROPONINI <0.03 05/24/2019   TROPONINI <0.03 05/24/2019   TROPONINI <0.03 05/24/2019  BNP No results for input(s): PROBNP in the last 8760 hours. TSH No results for input(s): TSH in the last 8760 hours. CHOLESTEROL Recent Labs    05/24/19 0401  CHOL 203*    Scheduled Meds: . amLODipine  5 mg Oral Daily  . aspirin  324 mg Oral NOW   Or  . aspirin  300 mg Rectal NOW  . [START ON 05/25/2019] aspirin EC  81 mg Oral Daily  . atorvastatin  40 mg Oral q1800  . hydrALAZINE  25 mg Oral Q8H  . insulin aspart  0-15 Units Subcutaneous TID WC  . mometasone-formoterol  2 puff Inhalation BID  . sodium chloride flush  3 mL Intravenous Once   Continuous Infusions: . sodium chloride 100 mL/hr at 05/24/19 1110  . heparin 1,150 Units/hr (05/24/19 1225)   PRN Meds:.acetaminophen, albuterol, nitroGLYCERIN, ondansetron  (ZOFRAN) IV  Assessment/Plan: Abnormal EKG CAD unlikely Acute renal failure HTN Type 2 DM, new onset per patient Hiccups Asthma  Continue sliding scale. Postpone cardiac work up for now. IV fluids for elevated BUN/Cr.  Renal OP consult.    LOS: 0 days    Dixie Dials  MD  05/24/2019, 1:34 PM

## 2019-05-24 NOTE — Progress Notes (Signed)
  Echocardiogram 2D Echocardiogram has been performed.  Walter Harmon 05/24/2019, 10:27 AM

## 2019-05-24 NOTE — Progress Notes (Signed)
ANTICOAGULATION CONSULT NOTE - Initial Consult  Pharmacy Consult for heparin Indication: chest pain/ACS  No Known Allergies  Patient Measurements:   Heparin Dosing Weight: 74 kg  Vital Signs: Temp: 97.6 F (36.4 C) (06/01 0219) Temp Source: Oral (06/01 0219) BP: 138/103 (06/01 0359) Pulse Rate: 100 (06/01 0359)  Labs: Recent Labs    05/24/19 0248  HGB 15.3  HCT 44.7  PLT 272    CrCl cannot be calculated (Patient's most recent lab result is older than the maximum 21 days allowed.).   Medical History: Past Medical History:  Diagnosis Date  . Asthma   . Diabetes mellitus   . Hypertension     Medications:  See medication history  Assessment: 53 yo man r/o ACS to start heparin.  He was not on anticoagulation PTA.   Goal of Therapy:  Heparin level 0.3-0.7 units/ml Monitor platelets by anticoagulation protocol: Yes   Plan:  Heparin bolus 3500 units and drip at 900 units/hr Check heparin level 6-8 hours after start Daily HL and CBC Monitor for bleeding complications  Walter Harmon 05/24/2019,4:13 AM

## 2019-05-24 NOTE — ED Notes (Signed)
EKG taken to Dr. Leonides Schanz

## 2019-05-24 NOTE — ED Notes (Signed)
ED TO INPATIENT HANDOFF REPORT  ED Nurse Name and Phone #: Kathie Rhodes RN  S Name/Age/Gender Walter Harmon Level 53 y.o. male Room/Bed: TRABC/TRABC  Code Status   Code Status: Full Code  Home/SNF/Other Home Patient oriented to: self, place, time and situation Is this baseline? Yes   Triage Complete: Triage complete  Chief Complaint hiccups  Triage Note Pt reports constant hiccups since yesterday with acid reflux and burring in throat.  Denies any chest pain or sob.    Allergies No Known Allergies  Level of Care/Admitting Diagnosis ED Disposition    ED Disposition Condition Comment   Admit  Hospital Area: [facility] [[id number]]  Level of Care: Telemetry Cardiac [103]  Covid Evaluation: N/A  Diagnosis: Acute coronary syndrome [facility]) [[id number]]  Admitting Physician: Dixie Dials [[id number]]  Attending Physician: Dixie Dials [[id number]]  PT Class (Do Not Modify): Observation [104]  PT Acc Code (Do Not Modify): Observation [10022]       B Medical/Surgery History Past Medical History:  Diagnosis Date  . Asthma   . Diabetes mellitus   . Hypertension    Past Surgical History:  Procedure Laterality Date  . ANKLE SURGERY       A IV Location/Drains/Wounds Patient Lines/Drains/Airways Status   Active Line/Drains/Airways    Name:   Placement date:   Placement time:   Site:   Days:   Peripheral IV 05/24/19 Left Forearm   05/24/19    0353    Forearm   less than 1   Incision 10/04/13 Back Left;Lower;Medial   10/04/13    1050     2058          Intake/Output Last 24 hours No intake or output data in the 24 hours ending 05/24/19 0446  Labs/Imaging Results for orders placed or performed during the hospital encounter of 05/24/19 (from the past 48 hour(s))  Basic metabolic panel     Status: Abnormal   Collection Time: 05/24/19  2:48 AM  Result Value Ref Range   Sodium 128 (L) 135 - 145 mmol/L   Potassium 5.1 3.5 - 5.1 mmol/L   Chloride 96 (L) 98 - 111  mmol/L   CO2 17 (L) 22 - 32 mmol/L   Glucose, Bld 424 (H) 70 - 99 mg/dL   BUN 50 (H) 6 - 20 mg/dL   Creatinine, Ser 4.05 (H) 0.61 - 1.24 mg/dL   Calcium 9.4 8.9 - 10.3 mg/dL   GFR calc non Af Amer 16 (L) >60 mL/min   GFR calc Af Amer 19 (L) >60 mL/min   Anion gap 15 5 - 15    Comment: Performed at [facility], [address] [place], [place] [address]  CBC     Status: None   Collection Time: 05/24/19  2:48 AM  Result Value Ref Range   WBC 10.1 4.0 - 10.5 K/uL   RBC 5.00 4.22 - 5.81 MIL/uL   Hemoglobin 15.3 13.0 - 17.0 g/dL   HCT 44.7 39.0 - 52.0 %   MCV 89.4 80.0 - 100.0 fL   MCH 30.6 26.0 - 34.0 pg   MCHC 34.2 30.0 - 36.0 g/dL   RDW 11.9 11.5 - 15.5 %   Platelets 272 150 - 400 K/uL   nRBC 0.0 0.0 - 0.2 %    Comment: Performed at [facility], [place] [address] [place],  [address]  Troponin I - ONCE - STAT     Status: None   Collection Time: 05/24/19  2:48 AM  Result Value Ref Range   Troponin I <0.03 <0.03 ng/mL    Comment: Performed at [facility] [address], [place], [place] [address]   Dg Chest Port 1 View  Result Date: 05/24/2019 CLINICAL DATA:  Chest pain EXAM: PORTABLE CHEST 1 VIEW COMPARISON:  01/16/2014 FINDINGS: No focal consolidation or effusion. Stable cardiomediastinal silhouette. Vague bibasilar opacities, possible foci of atelectasis. No pneumothorax. IMPRESSION: Vague basilar opacities, could reflect small foci of atelectasis. If there is appropriate clinical history, could consider atypical or viral infection Electronically Signed   By: Donavan Foil M.D.   On: 05/24/2019 03:38    Pending Labs Unresulted Labs (From admission, onward)    Start     Ordered   05/25/19 0500  Heparin level (unfractionated)  Daily,   R     05/24/19 0405   05/25/19 0500  CBC  Daily,   R     05/24/19 0405   05/24/19 1100  Heparin level (unfractionated)  Once-Timed,   R     05/24/19 0405   05/24/19 0500  Lipid panel  Tomorrow morning,   R     05/24/19  0351   05/24/19 0350  Troponin I - Now Then Q6H  Now then every 6 hours,   STAT     05/24/19 0351   05/24/19 0349  HIV antibody (Routine Testing)  Once,   R     05/24/19 0351   05/24/19 0313  SARS Coronavirus 2 (CEPHEID - Performed in [facility] hospital lab), [facility] Order  (Asymptomatic Patients Labs)  ONCE - STAT,   R    Question:  Rule Out  Answer:  Yes   05/24/19 0312          Vitals/Pain Today's Vitals   05/24/19 0359 05/24/19 0400 05/24/19 0415 05/24/19 0442  BP: (!) 138/103 (!) 133/102 (!) 118/93   Pulse: 100 90 80   Resp: 16 14 16    Temp:      TempSrc:      SpO2: 96% 97% 94%   PainSc:    0-No pain    Isolation Precautions No active isolations  Medications Medications  sodium chloride flush (NS) 0.9 % injection 3 mL (has no administration in time range)  aspirin chewable tablet 324 mg (324 mg Oral Not Given 05/24/19 0402)    Or  aspirin suppository 300 mg ( Rectal See Alternative 05/24/19 0402)  aspirin EC tablet 81 mg (has no administration in time range)  nitroGLYCERIN (NITROSTAT) SL tablet 0.4 mg (has no administration in time range)  acetaminophen (TYLENOL) tablet 650 mg (has no administration in time range)  ondansetron (ZOFRAN) injection 4 mg (has no administration in time range)  atorvastatin (LIPITOR) tablet 40 mg (has no administration in time range)  mometasone-formoterol (DULERA) 200-5 MCG/ACT inhaler 2 puff (has no administration in time range)  albuterol (PROVENTIL) (2.5 MG/3ML) 0.083% nebulizer solution 3 mL (has no administration in time range)  heparin ADULT infusion 100 units/mL (25000 units/230mL sodium chloride 0.45%) (900 Units/hr Intravenous New Bag/Given 05/24/19 0438)  amLODipine (NORVASC) tablet 5 mg (has no administration in time range)  aspirin chewable tablet 324 mg (324 mg Oral Given 05/24/19 0329)  pantoprazole (PROTONIX) injection 40 mg (40 mg Intravenous Given 05/24/19 0334)  metoCLOPramide (REGLAN) injection 10 mg (10 mg Intravenous Given  05/24/19 0330)  heparin bolus via infusion 3,500 Units (3,500 Units Intravenous Bolus from Bag 05/24/19 0440)    Mobility walks Low fall risk   Focused  Assessments Cardiac Assessment Handoff:    Lab Results  Component Value Date   CKTOTAL 348 (H) 08/10/2012   CKMB 4.0 08/10/2012   TROPONINI <0.03 05/24/2019   Lab Results  Component Value Date   DDIMER 0.66 (H) 08/10/2012   Does the Patient currently have chest pain? No     R Recommendations: See Admitting Provider Note  Report given to:   Additional Notes: alert and oriented

## 2019-05-24 NOTE — ED Notes (Signed)
Paged cardio to Dr Leonides Schanz

## 2019-05-24 NOTE — Progress Notes (Signed)
05/23/2018 Rn spoke to primary today. Per Dr Doylene Canard hold Pneumonia Vaccine for now. [facility] RN.

## 2019-05-24 NOTE — Progress Notes (Signed)
Inpatient Diabetes Program Recommendations  AACE/ADA: New Consensus Statement on Inpatient Glycemic Control (2015)  Target Ranges:  Prepandial:   less than 140 mg/dL      Peak postprandial:   less than 180 mg/dL (1-2 hours)      Critically ill patients:  140 - 180 mg/dL   Results for Walter Harmon, Walter Harmon (MRN [id number]) as of 05/24/2019 10:13  Ref. Range 05/24/2019 02:48  Glucose Latest Ref Range: 70 - 99 mg/dL 424 (H)    Admit with: Acute coronary syndrome/ Hiccups  History: DM, HTN  Home DM Meds: NONE  Current Orders: NONE      MD- Per H&P, patient with History of Diabetes.  NO medications at home.  Lab glucose 424 mg/dl at 2:48am today.  Please consider the following:  1. Start Novolog Moderate Correction Scale/ SSI (0-15 units) TID AC + HS  2. Place order for Current Hemoglobin A1c level     --Will follow patient during hospitalization--  Wyn Quaker RN, MSN, CDE Diabetes Coordinator Inpatient Glycemic Control Team Team Pager: [phone] (8a-5p)

## 2019-05-24 NOTE — ED Notes (Signed)
Cardiac provider bedside

## 2019-05-24 NOTE — ED Triage Notes (Signed)
Pt reports constant hiccups since yesterday with acid reflux and burring in throat.  Denies any chest pain or sob.

## 2019-05-24 NOTE — H&P (Signed)
Referring Physician:   AREN PRYDE is an 53 y.o. male.                       Chief Complaint: Hiccough   HPI: 53 years old male with history of hypertension, diabetes and asthma had intractable hiccups for the past 12 hours. He had pneumonia 2 weeks ago and has finished antibiotics. He is feeling better with Reglan and pantoprazole use. His EKG shows anterior ST elevations similar to EKG done 5 years ago. Patient do not take any antihypertensive medications.  Past Medical History:  Diagnosis Date  . Asthma   . Diabetes mellitus   . Hypertension       Past Surgical History:  Procedure Laterality Date  . ANKLE SURGERY      Family History  Problem Relation Age of Onset  . Diabetes type II Mother    Social History:  reports that he has never smoked. He has never used smokeless tobacco. He reports that he does not drink alcohol or use drugs.  Allergies: No Known Allergies  (Not in a hospital admission)   Results for orders placed or performed during the hospital encounter of 05/24/19 (from the past 48 hour(s))  CBC     Status: None   Collection Time: 05/24/19  2:48 AM  Result Value Ref Range   WBC 10.1 4.0 - 10.5 K/uL   RBC 5.00 4.22 - 5.81 MIL/uL   Hemoglobin 15.3 13.0 - 17.0 g/dL   HCT 44.7 39.0 - 52.0 %   MCV 89.4 80.0 - 100.0 fL   MCH 30.6 26.0 - 34.0 pg   MCHC 34.2 30.0 - 36.0 g/dL   RDW 11.9 11.5 - 15.5 %   Platelets 272 150 - 400 K/uL   nRBC 0.0 0.0 - 0.2 %    Comment: Performed at [facility], [place] [address], [place], [place] [address]   Dg Chest Port 1 View  Result Date: 05/24/2019 CLINICAL DATA:  Chest pain EXAM: PORTABLE CHEST 1 VIEW COMPARISON:  01/16/2014 FINDINGS: No focal consolidation or effusion. Stable cardiomediastinal silhouette. Vague bibasilar opacities, possible foci of atelectasis. No pneumothorax. IMPRESSION: Vague basilar opacities, could reflect small foci of atelectasis. If there is appropriate clinical history, could consider  atypical or viral infection Electronically Signed   By: Donavan Foil M.D.   On: 05/24/2019 03:38    Review Of Systems Constitutional: No fever, chills, weight loss or gain. Eyes: No vision change, wears glasses. No discharge or pain. Ears: No hearing loss, No tinnitus. Respiratory: Positive asthma, COPD, positive pneumonias. positive shortness of breath. No hemoptysis. Cardiovascular: No chest pain, palpitation, leg edema. Gastrointestinal: No nausea, vomiting, diarrhea, constipation. No GI bleed. No hepatitis. Genitourinary: No dysuria, hematuria, kidney stone. No incontinance. Neurological: No headache, stroke, seizures.  Psychiatry: No psych facility admission for anxiety, depression, suicide. No detox. Skin: No rash. Musculoskeletal: No joint pain, fibromyalgia. No neck pain, back pain. Lymphadenopathy: No lymphadenopathy. Hematology: No anemia or easy bruising.   Blood pressure (!) 159/111, pulse 91, temperature 97.6 F (36.4 C), temperature source Oral, resp. rate 16, SpO2 97 %. There is no height or weight on file to calculate BMI. General appearance: alert, cooperative, appears stated age and no distress Head: Normocephalic, atraumatic. Eyes: Brown eyes, pink conjunctiva, corneas clear. PERRL, EOM's intact. Neck: No adenopathy, no carotid bruit, no JVD, supple, symmetrical, trachea midline and thyroid not enlarged. Resp: Clear to auscultation bilaterally. Cardio: Regular rate and rhythm, S1, S2  normal, II/VI systolic murmur, no click, rub or gallop GI: Soft, non-tender; bowel sounds normal; no organomegaly. Extremities: No edema, cyanosis or clubbing. Skin: Warm and dry.  Neurologic: Alert and oriented X 3, normal strength. Normal coordination and gait.  Assessment/Plan Acute coronary syndrome. HTN Hiccups Asthma  Place in observation. Cycle troponin I. Consider NM myocardial stress test v/s cardiac cath if troponin is abnormal. Start amlodipine.  Birdie Riddle,  MD  05/24/2019, 3:54 AM

## 2019-05-24 NOTE — ED Notes (Signed)
Called lab inquiring about trop results.  Lab tech stated just resulted.

## 2019-05-24 NOTE — ED Provider Notes (Signed)
TIME SEEN: 2:53 AM  CHIEF COMPLAINT: Hiccups  HPI: Patient is a 53 year old male with history of hypertension, diabetes who presents to the emergency department with complaints of intractable hiccups for the past 12 hours.  He denies having any chest pain, shortness of breath, nausea, vomiting, diaphoresis, dizziness.  States he just feels uncomfortable from having hiccups for 12 hours.  No known history of CAD.  ROS: See HPI Constitutional: no fever  Eyes: no drainage  ENT: no runny nose   Cardiovascular:  no chest pain  Resp: no SOB  GI: no vomiting GU: no dysuria Integumentary: no rash  Allergy: no hives  Musculoskeletal: no leg swelling  Neurological: no slurred speech ROS otherwise negative  PAST MEDICAL HISTORY/PAST SURGICAL HISTORY:  Past Medical History:  Diagnosis Date  . Asthma   . Diabetes mellitus   . Hypertension     MEDICATIONS:  Prior to Admission medications   Medication Sig Start Date End Date Taking? Authorizing Provider  azithromycin (ZITHROMAX) 250 MG tablet Take 2 tablets (500 mg) on day 1, then take 1 tablet (250 mg) on days 2-5 05/20/19   Fenton Foy, NP  budesonide-formoterol (SYMBICORT) 160-4.5 MCG/ACT inhaler INHALE TWO PUFFS INTO THE LUNGS DAILY 02/20/18   Collene Gobble, MD  budesonide-formoterol (SYMBICORT) 160-4.5 MCG/ACT inhaler Inhale 2 puffs into the lungs 2 (two) times daily for 1 day. 11/13/18 11/14/18  Collene Gobble, MD  ibuprofen (ADVIL,MOTRIN) 200 MG tablet Take 200 mg by mouth every 6 (six) hours as needed for mild pain or moderate pain. Reported on 01/15/2016    [provider]  predniSONE (DELTASONE) 10 MG tablet Take 4 tabs for 2 days, then 3 tabs for 2 days, then 2 tabs for 2 days, then 1 tab for 2 days, then stop 05/20/19   Fenton Foy, NP  PROAIR HFA 108 (90 Base) MCG/ACT inhaler INHALE TWO PUFFS BY MOUTH EVERY 6 HOURS AS NEEDED FOR WHEEZING 02/27/18   Byrum, Rose Fillers, MD    ALLERGIES:  No Known Allergies  SOCIAL  HISTORY:  Social History   Tobacco Use  . Smoking status: Never Smoker  . Smokeless tobacco: Never Used  Substance Use Topics  . Alcohol use: No    FAMILY HISTORY: Family History  Problem Relation Age of Onset  . Diabetes type II Mother     EXAM: BP (!) 159/111 (BP Location: Left Arm)   Pulse 91   Temp 97.6 F (36.4 C) (Oral)   Resp 16   SpO2 97%  CONSTITUTIONAL: Alert and oriented and responds appropriately to questions.  Chronically ill-appearing, actively hiccuping, in no distress HEAD: Normocephalic EYES: Conjunctivae clear, pupils appear equal, EOMI ENT: normal nose; moist mucous membranes NECK: Supple, no meningismus, no nuchal rigidity, no LAD  CARD: RRR; S1 and S2 appreciated; no murmurs, no clicks, no rubs, no gallops RESP: Normal chest excursion without splinting or tachypnea; breath sounds clear and equal bilaterally; no wheezes, no rhonchi, no rales, no hypoxia or respiratory distress, speaking full sentences ABD/GI: Normal bowel sounds; non-distended; soft, non-tender, no rebound, no guarding, no peritoneal signs, no hepatosplenomegaly BACK:  The back appears normal and is non-tender to palpation, there is no CVA tenderness EXT: Normal ROM in all joints; non-tender to palpation; no edema; normal capillary refill; no cyanosis, no calf tenderness or swelling    SKIN: Normal color for age and race; warm; no rash NEURO: Moves all extremities equally PSYCH: The patient's mood and manner are appropriate. Grooming and personal  hygiene are appropriate.  MEDICAL DECISION MAKING: Patient here with intractable hiccups.  EKG was obtained in triage that shows ST elevation in inferior and anterior leads but does appear similar to previous EKG but may be more elevation in the anterior leads than compared to previous.  He has no chest pain, shortness of breath only intractable hiccups.  My suspicion that this is a STEMI is low but will discuss with cardiology on-call.  ED  PROGRESS: 3:05 AM  Spoke with Dr. Doylene Canard on-call for cardiology who is reviewed patient's EKGs today as well as his old EKGs in 2015 and agrees that they look concerning.  Agrees on holding off calling this a STEMI and activating a code STEMI at this time given patient is asymptomatic.  I have updated patient with this plan that he will need to be admitted to the hospital and likely have a stress test.  He agrees.  Dr. Doylene Canard will see for admission.  Will give patient aspirin and PPI and Reglan for intractable hiccups.  Patient has already been taken upstairs.  His labs show negative troponin.  He is hyperglycemic without DKA.  Mild hyponatremia.  Coronavirus testing was positive.   Walter Harmon was evaluated in Emergency Department on 05/24/2019 for the symptoms described in the history of present illness. He was evaluated in the context of the global COVID-19 pandemic, which necessitated consideration that the patient might be at risk for infection with the SARS-CoV-2 virus that causes COVID-19. Institutional protocols and algorithms that pertain to the evaluation of patients at risk for COVID-19 are in a state of rapid change based on information released by regulatory bodies including the CDC and federal and state organizations. These policies and algorithms were followed during the patient's care in the ED.    Date: 05/24/2019 2:43 AM  Rate: 98  Rhythm: normal sinus rhythm  QRS Axis: normal, LVH  Intervals: normal  ST/T Wave abnormalities: Patient has ST elevation in inferior lateral leads that appear similar to EKGs in 2015.  Also has ST elevation in anterior leads that appears new compared EKGs in 2015.  Conduction Disutrbances: none  Narrative Interpretation: ST elevation in inferior and lateral leads that appear similar to 2015.  New ST elevation in anterior leads compared to 2015.    EKG Interpretation  Date/Time:  Monday May 24 2019 03:12:19 EDT Ventricular Rate:  92 PR  Interval:    QRS Duration: 93 QT Interval:  349 QTC Calculation: 432 R Axis:   142 Text Interpretation:  Right and left arm electrode reversal, interpretation assumes no reversal Sinus rhythm Right axis deviation Abnormal R-wave progression, early transition Consider left ventricular hypertrophy Abnormal T, consider ischemia, diffuse leads Anterior ST elevation, probably due to LVH Confirmed by Pryor Curia [phone]) on 05/24/2019 3:14:20 AM               CRITICAL CARE Performed by: Cyril Mourning Aislinn Feliz   Total critical care time: 45 minutes  Critical care time was exclusive of separately billable procedures and treating other patients.  Critical care was necessary to treat or prevent imminent or life-threatening deterioration.  Critical care was time spent personally by me on the following activities: development of treatment plan with patient and/or surrogate as well as nursing, discussions with consultants, evaluation of patient's response to treatment, examination of patient, obtaining history from patient or surrogate, ordering and performing treatments and interventions, ordering and review of laboratory studies, ordering and review of radiographic studies, pulse oximetry and re-evaluation of  patient's condition.      Evalena Fujii, Delice Bison, DO 05/24/19 [phone]

## 2019-05-24 NOTE — Progress Notes (Signed)
[facility] for IV heparin Indication: ACS  No Known Allergies  Patient Measurements: Height: 5\' 5"  (165.1 cm) Weight: 155 lb 9.6 oz (70.6 kg) IBW/kg (Calculated) : 61.5 Heparin Dosing Weight: actual body weight   Vital Signs: Temp: 97.6 F (36.4 C) (06/01 0843) Temp Source: Oral (06/01 0843) BP: 148/98 (06/01 0843) Pulse Rate: 84 (06/01 0520)  Labs: Recent Labs    05/24/19 0248 05/24/19 0401 05/24/19 1042  HGB 15.3  --   --   HCT 44.7  --   --   PLT 272  --   --   HEPARINUNFRC  --   --  0.18*  CREATININE 4.05*  --   --   TROPONINI <0.03 <0.03  --     Estimated Creatinine Clearance: 18.8 mL/min (A) (by C-G formula based on SCr of 4.05 mg/dL (H)).   Medical History: Past Medical History:  Diagnosis Date  . Asthma   . Diabetes mellitus   . Hypertension     Assessment: 16 yoM r/o ACS to start heparin.  He was not on anticoagulation PTA.     First heparin level = 0.18 units/mL, subtherapeutic  CBC: Hgb, Pltc WNL  No bleeding or infusion issues reported per nursing  Goal of Therapy:  Heparin level 0.3-0.7 units/ml Monitor platelets by anticoagulation protocol: Yes   Plan:  Increase heparin infusion to 1150 units/hr Heparin level 8 hours after rate change Daily heparin level, CBC Monitor closely for s/sx of bleeding F/u cardiology recs   Lindell Spar, PharmD, BCPS Pager: [phone] 05/24/2019 12:06 PM

## 2019-05-25 ENCOUNTER — Encounter (HOSPITAL_COMMUNITY): Payer: Self-pay

## 2019-05-25 LAB — BASIC METABOLIC PANEL
Anion gap: 10 (ref 5–15)
BUN: 45 mg/dL — ABNORMAL HIGH (ref 6–20)
CO2: 20 mmol/L — ABNORMAL LOW (ref 22–32)
Calcium: 8.5 mg/dL — ABNORMAL LOW (ref 8.9–10.3)
Chloride: 104 mmol/L (ref 98–111)
Creatinine, Ser: 3.37 mg/dL — ABNORMAL HIGH (ref 0.61–1.24)
GFR calc Af Amer: 23 mL/min — ABNORMAL LOW (ref >60)
GFR calc non Af Amer: 20 mL/min — ABNORMAL LOW (ref >60)
Glucose, Bld: 311 mg/dL — ABNORMAL HIGH (ref 70–99)
Potassium: 4.3 mmol/L (ref 3.5–5.1)
Sodium: 134 mmol/L — ABNORMAL LOW (ref 135–145)

## 2019-05-25 LAB — GLUCOSE, CAPILLARY
Glucose-Capillary: 308 mg/dL — ABNORMAL HIGH (ref 70–99)
Glucose-Capillary: 305 mg/dL — ABNORMAL HIGH (ref 70–99)
Glucose-Capillary: 170 mg/dL — ABNORMAL HIGH (ref 70–99)
Glucose-Capillary: 118 mg/dL — ABNORMAL HIGH (ref 70–99)

## 2019-05-25 MED ORDER — LIVING WELL WITH DIABETES BOOK: 1.0000 | Freq: Once | Status: AC

## 2019-05-25 MED ORDER — INSULIN STARTER KIT- SYRINGES (ENGLISH)
1.0000 | Freq: Once | Status: AC
  Administered 2019-05-26: 1
  Filled 2019-05-25: qty 1

## 2019-05-25 MED ORDER — ADULT MULTIVITAMIN W/MINERALS CH
1.0000 | ORAL_TABLET | Freq: Every day | ORAL | Status: DC
  Administered 2019-05-25: 1 via ORAL
  Filled 2019-05-25: qty 1

## 2019-05-25 MED ORDER — ADULT MULTIVITAMIN W/MINERALS CH: 1.0000 | ORAL_TABLET | Freq: Every day | ORAL | Status: AC

## 2019-05-25 MED ORDER — ALLOPURINOL 100 MG PO TABS
100.0000 mg | ORAL_TABLET | Freq: Every day | ORAL | Status: DC
  Administered 2019-05-25: 100 mg via ORAL
  Filled 2019-05-25: qty 1

## 2019-05-25 MED ORDER — PANTOPRAZOLE SODIUM 40 MG PO TBEC: 40.0000 mg | DELAYED_RELEASE_TABLET | Freq: Every day | ORAL | 1 refills | Status: DC

## 2019-05-25 MED ORDER — GLUCERNA SHAKE PO LIQD: 237.0000 mL | ORAL | 2 refills | Status: DC

## 2019-05-25 MED ORDER — HYDRALAZINE HCL 25 MG PO TABS: 25.0000 mg | ORAL_TABLET | Freq: Two times a day (BID) | ORAL | 3 refills | Status: DC

## 2019-05-25 MED ORDER — INSULIN ASPART 100 UNIT/ML ~~LOC~~ SOLN: 0.0000 [IU] | Freq: Three times a day (TID) | SUBCUTANEOUS | 11 refills | Status: DC

## 2019-05-25 MED ORDER — ALLOPURINOL 100 MG PO TABS: 100.0000 mg | ORAL_TABLET | Freq: Every day | ORAL | 2 refills | Status: DC

## 2019-05-25 MED ORDER — ASPIRIN 81 MG PO TBEC: 81.0000 mg | DELAYED_RELEASE_TABLET | Freq: Every day | ORAL | Status: DC

## 2019-05-25 MED ORDER — LIVING WELL WITH DIABETES BOOK
Freq: Once | Status: AC
  Administered 2019-05-25: 17:00:00
  Filled 2019-05-25: qty 1

## 2019-05-25 MED ORDER — INSULIN GLARGINE 100 UNIT/ML ~~LOC~~ SOLN
15.0000 [IU] | Freq: Every day | SUBCUTANEOUS | Status: DC
  Administered 2019-05-25: 15 [IU] via SUBCUTANEOUS
  Filled 2019-05-25 (×2): qty 0.15

## 2019-05-25 MED ORDER — AMLODIPINE BESYLATE 5 MG PO TABS: 5.0000 mg | ORAL_TABLET | Freq: Every day | ORAL | 3 refills | Status: DC

## 2019-05-25 MED ORDER — NITROGLYCERIN 0.4 MG SL SUBL: 0.4000 mg | SUBLINGUAL_TABLET | SUBLINGUAL | 1 refills | Status: DC | PRN

## 2019-05-25 MED ORDER — METOCLOPRAMIDE HCL 5 MG PO TABS
5.0000 mg | ORAL_TABLET | Freq: Three times a day (TID) | ORAL | Status: DC | PRN
  Administered 2019-05-25 – 2019-05-26 (×2): 5 mg via ORAL
  Filled 2019-05-25 (×2): qty 1

## 2019-05-25 MED ORDER — BISACODYL 5 MG PO TBEC: 5.0000 mg | DELAYED_RELEASE_TABLET | Freq: Every day | ORAL | 0 refills | Status: DC | PRN

## 2019-05-25 MED ORDER — PANTOPRAZOLE SODIUM 40 MG PO TBEC
40.0000 mg | DELAYED_RELEASE_TABLET | Freq: Every day | ORAL | Status: DC
  Administered 2019-05-25: 40 mg via ORAL
  Filled 2019-05-25: qty 1

## 2019-05-25 MED ORDER — METOCLOPRAMIDE HCL 5 MG PO TABS: 5.0000 mg | ORAL_TABLET | Freq: Three times a day (TID) | ORAL | 1 refills | Status: DC | PRN

## 2019-05-25 MED ORDER — ATORVASTATIN CALCIUM 40 MG PO TABS: 40.0000 mg | ORAL_TABLET | Freq: Every day | ORAL | 3 refills | Status: DC

## 2019-05-25 MED ORDER — INSULIN GLARGINE 100 UNIT/ML ~~LOC~~ SOLN: 20.0000 [IU] | Freq: Every day | SUBCUTANEOUS | 3 refills | Status: DC

## 2019-05-25 MED ORDER — GLUCERNA SHAKE PO LIQD
237.0000 mL | Freq: Two times a day (BID) | ORAL | Status: DC
  Administered 2019-05-25 (×2): 237 mL via ORAL

## 2019-05-25 NOTE — Plan of Care (Signed)
  Problem: Clinical Measurements: Goal: Respiratory complications will improve Outcome: Progressing Note:  Stable on room air.  No s/s of respiratory complications noted.

## 2019-05-25 NOTE — Discharge Instructions (Signed)
Carbohydrate Counting for People with Diabetes  Why Is Carbohydrate Counting Important?  Counting carbohydrate servings may help you control your blood glucose level so that you feel better.  The balance between the carbohydrates you eat and insulin determines what your blood glucose level will be after eating.  Carbohydrate counting can also help you plan your meals.  Which Foods Have Carbohydrates? Foods with carbohydrates include:  Breads, crackers, and cereals  Pasta, rice, and grains  Starchy vegetables, such as potatoes, corn, and peas  Beans and legumes  Milk, soy milk, and yogurt  Fruits and fruit juices  Sweets, such as cakes, cookies, ice cream, jam, and jelly  Carbohydrate Servings In diabetes meal planning, 1 serving of a food with carbohydrate has about 15 grams of carbohydrate:  Check serving sizes with measuring cups and spoons or a food scale.  Read the Nutrition Facts on food labels to find out how many grams of carbohydrate are in foods you eat. The food lists in this handout show portions that have about 15 grams of carbohydrate.   Meal Planning Tips  An Eating Plan tells you how many carbohydrate servings to eat at your meals and snacks. For many adults, eating 3 to 5 servings of carbohydrate foods at each meal and 1 or 2 carbohydrate servings for each snack works well.  In a healthy daily Eating Plan, most carbohydrates come from: ? At least 6 servings of fruits and nonstarchy vegetables ? At least 6 servings of grains, beans, and starchy vegetables, with at least 3 servings from whole grains ? At least 2 servings of milk or milk products  Check your blood glucose level regularly. It can tell you if you need to adjust when you eat carbohydrates.  Eating foods that have fiber, such as whole grains, and having very few salty foods is good for your health.  Eat 4 to 6 ounces of meat or other protein foods (such as soybean burgers) each day. Choose  low-fat sources of protein, such as lean beef, lean pork, chicken, fish, low-fat cheese, or vegetarian foods such as soy.  Eat some healthy fats, such as olive oil, canola oil, and nuts.  Eat very little saturated fats. These unhealthy fats are found in butter, cream, and high-fat meats, such as bacon and sausage.  Eat very little or no trans fats. These unhealthy fats are found in all foods that list partially hydrogenated oil as an ingredient.  Label Reading Tips The Nutrition Facts panel on a label lists the grams of total carbohydrate in1 standard serving. The labels standard serving may be larger or smaller than 1 carbohydrate serving. To figure out how many carbohydrate servings are in the food:  First, look at the labels standard serving size.  Check the grams of total carbohydrate. This is the amount of carbohydrate in 1 standard serving.  Divide the grams of total carbohydrate by 15. This number equals the number of carbohydrate servings in 1 standard serving. Remember: 1 carbohydrate serving is 15 grams of carbohydrate.  Note: You may ignore the grams of sugars on the Nutrition Facts panel because they are included in the grams of total carbohydrate.   Foods Recommended 1 serving = about 15 grams of carbohydrate  Starches  1 slice bread (1 ounce)  1 tortilla (6-inch size)   large bagel (1 ounce)  2 taco shells (5-inch size)   hamburger or hot dog bun ( ounce)   cup ready-to-eat unsweetened cereal   cup cooked cereal  1 cup broth-based soup  4 to 6 small crackers  1/3 cup pasta or rice (cooked)   cup beans, peas, corn, sweet potatoes, winter squash, or mashed or boiled potatoes (cooked)   large baked potato (3 ounces)   ounce pretzels, potato chips, or tortilla chips  3 cups popcorn (popped)  Fruit  1 small fresh fruit ( to 1 cup)   cup canned or frozen fruit  2 tablespoons dried fruit (blueberries, cherries, cranberries, mixed  fruit, raisins)  17 small grapes (3 ounces)  1 cup melon or berries   cup unsweetened fruit juice  Milk  1 cup fat-free or reduced-fat milk  1 cup soy milk  2/3 cup (6 ounces) nonfat yogurt sweetened with sugar-free sweetener  Sweets and Desserts  2-inch square cake (unfrosted)  2 small cookies (2/3 ounce)   cup ice cream or frozen yogurt   cup sherbet or sorbet  1 tablespoon syrup, jam, jelly, table sugar, or honey  2 tablespoons light syrup  Other Foods  Count 1 cup raw vegetables or  cup cooked nonstarchy vegetables as zero (0) carbohydrate servings or free foods. If you eat 3 or more servings at one meal, count them as 1 carbohydrate serving.  Foods that have less than 20 calories in each serving also may be counted as zero carbohydrate servings or free foods.  Count 1 cup of casserole or other mixed foods as 2 carbohydrate servings.     Carbohydrate Counting for People with Diabetes Sample 1-Day Menu Breakfast 1 extra-small banana (1 carbohydrate serving) 1 cup low-fat or fat-free milk (1 carbohydrate serving) 1 slice whole wheat bread (1 carbohydrate serving) 1 teaspoon margarine  Lunch 2 ounces [place] slices 2 slices whole wheat bread (2 carbohydrate servings) 2 lettuce leaves 4 celery sticks 4 carrot sticks 1 medium apple (1 carbohydrate serving) 1 cup low-fat or fat-free milk (1 carbohydrate serving)  Afternoon Snack 2 tablespoons raisins (1 carbohydrate serving) 3/4 ounce unsalted mini pretzels (1 carbohydrate serving)  Evening Meal 3 ounces lean roast beef  1/2 large baked potato (2 carbohydrate servings) 1 tablespoon reduced-fat sour cream 1/2 cup green beans 1 tablespoon light salad dressing 1 whole wheat dinner roll (1 carbohydrate serving) 1 teaspoon margarine 1 cup melon balls (1 carbohydrate serving)  Evening Snack 2 tablespoons unsalted nuts     Your average blood sugar was 240. The hemoglobin A1c was 10% (average past 2-3  months of 240). Goal is 7%. (last checked in our system was 7.4% in 2015).  Please check your blood sugar with a glucometer (can purchase at [facility]) two times a day and record. Take this to your appointment at [place]. Please make sure to follow up with your primary care provider in regards to your diabetes.    Your doctor may have ordered insulin PEN (see below) or vial / syringe to administer insulin. Email links will be sent to you with demonstration of both of these.  Insulin Injection Instructions, Using Insulin Pens, Adult A subcutaneous injection is a shot of medicine that is injected into the layer of fat and tissue between skin and muscle. People with type 1 diabetes must take insulin because their bodies do not make it. People with type 2 diabetes may need to take insulin. There are many different types of insulin. The type of insulin that you take may determine how many injections you give yourself and when you need to give the injections. Supplies needed: Soap and water to  wash hands. Your insulin pen. A new, unused needle. Alcohol wipes. A disposal container that is meant for sharp items (sharps container), such as an empty plastic bottle with a cover. How to choose a site for injection The body absorbs insulin differently, depending on where the insulin is injected (injection site). It is best to inject insulin into the same body area each time (for example, always in the abdomen), but you should use a different spot in that area for each injection. Do not inject the insulin in the same spot each time. There are five main areas that can be used for injecting. These areas include: Abdomen. This is the preferred area. Front of thigh. Upper, outer side of thigh. Upper, outer side of arm. Upper, outer part of buttock. How to use an insulin pen  First, follow the steps for Get ready, then continue with the steps for Inject the insulin. Get ready Wash your  hands with soap and water. If soap and water are not available, use hand sanitizer. Before you give yourself an insulin injection, be sure to test your blood sugar level (blood glucose level) and write down that number. Follow any instructions from your health care provider about what to do if your blood glucose level is higher or lower than your normal range. Check the expiration date and the type of insulin that is in the pen. If you are using CLEAR insulin, check to see that it is clear and free of clumps. If you are using CLOUDY insulin, do not shake the pen to get the injection ready. Instead, get it ready in one of these ways: Gently roll the pen between your palms several times. Tip the pen up and down several times. Remove the cap from the insulin pen. Use an alcohol wipe to clean the rubber tip of the pen. Remove the protective paper tab from the disposable needle. Do not let the needle touch anything. Screw a new, unused needle onto the pen. Remove the outer plastic needle cover. Do not throw away the outer plastic cover yet. If the pen uses a special safety needle, leave the inner needle shield in place. If the pen does not use a special safety needle, remove the inner plastic cover from the needle. Follow the manufacturer's instructions to prime the insulin pen with the volume of insulin needed. Hold the pen with the needle pointing up, and push the button on the opposite end of the pen until a drop of insulin appears at the needle tip. If no insulin appears, repeat this step. Turn the button (dial) to the number of units of insulin that you will be injecting. Inject the insulin Use an alcohol wipe to clean the site where you will be injecting the needle. Let the site air-dry. Hold the pen in the palm of your writing hand like a pencil. If directed by your health care provider, use your other hand to pinch and hold about an inch (2.5 cm) of skin at the injection site. Do not directly  touch the cleaned part of the skin. Gently but quickly, use your writing hand to put the needle straight into the skin. The needle should be at a 90-degree angle (perpendicular) to the skin. When the needle is completely inserted into the skin, use your thumb or index finger of your writing hand to push the top button of the pen down all the way to inject the insulin. Let go of the skin that you are pinching. Continue to  hold the pen in place with your writing hand. Wait 10 seconds, then pull the needle straight out of the skin. This will allow all of the insulin to go from the pen and needle into your body. Carefully put the larger (outer) plastic cover of the needle back over the needle, then unscrew the capped needle and discard it in a sharps container, such as an empty plastic bottle with a cover. Put the plastic cap back on the insulin pen. How to throw away supplies Discard all used needles in a puncture-proof sharps disposal container. You can ask your local pharmacy about where you can get this kind of disposal container, or you can use an empty plastic liquid laundry detergent bottle that has a cover. Follow the disposal regulations for the area where you live. Do not use any needle more than one time. Throw away empty disposable pens in the regular trash. Questions to ask your health care provider How often should I be taking insulin? How often should I check my blood glucose? What amount of insulin should I be taking at each time? What are the side effects? What should I do if my blood glucose is too high? What should I do if my blood glucose is too low? What should I do if I forget to take my insulin? What number should I call if I have questions? Where to find more information American Diabetes Association (ADA): www.diabetes.org American Association of Diabetes Educators (AADE) Patient Resources: https://www.diabeteseducator.org Summary A subcutaneous injection is a shot of  medicine that is injected into the layer of fat and tissue between skin and muscle. Before you give yourself an insulin injection, be sure to test your blood sugar level (blood glucose level) and write down that number. Check the expiration date and the type of insulin that is in the pen. The type of insulin that you take may determine how many injections you give yourself and when you need to give the injections. It is best to inject insulin into the same body area each time (for example, always in the abdomen), but you should use a different spot in that area for each injection. This information is not intended to replace advice given to you by your health care provider. Make sure you discuss any questions you have with your health care provider. Document Released: 01/12/2016 Document Revised: 12/29/2017 Document Reviewed: 01/12/2016 Elsevier Interactive Patient Education  2019 [place].  Blood Glucose Monitoring, Adult Monitoring your blood sugar (glucose) is an important part of managing your diabetes (diabetes mellitus). Blood glucose monitoring involves checking your blood glucose as often as directed and keeping a record (log) of your results over time. Checking your blood glucose regularly and keeping a blood glucose log can:  Help you and your health care provider adjust your diabetes management plan as needed, including your medicines or insulin.  Help you understand how food, exercise, illnesses, and medicines affect your blood glucose.  Let you know what your blood glucose is at any time. You can quickly find out if you have low blood glucose (hypoglycemia) or high blood glucose (hyperglycemia). Your health care provider will set individualized treatment goals for you. Your goals will be based on your age, other medical conditions you have, and how you respond to diabetes treatment. Generally, the goal of treatment is to maintain the following blood glucose levels:  Before meals  (preprandial): 80-130 mg/dL (4.4-7.2 mmol/L).  After meals (postprandial): below 180 mg/dL (10 mmol/L).  A1c level: less  than 7%. Supplies needed:  Blood glucose meter.  Test strips for your meter. Each meter has its own strips. You must use the strips that came with your meter.  A needle to prick your finger (lancet). Do not use a lancet more than one time.  A device that holds the lancet (lancing device).  A journal or log book to write down your results. How to check your blood glucose  1. Wash your hands with soap and water. 2. Prick the side of your finger (not the tip) with the lancet. Use a different finger each time. 3. Gently rub the finger until a small drop of blood appears. 4. Follow instructions that come with your meter for inserting the test strip, applying blood to the strip, and using your blood glucose meter. 5. Write down your result and any notes. Some meters allow you to use areas of your body other than your finger (alternative sites) to test your blood. The most common alternative sites are:  Forearm.  Thigh.  Palm of the hand. If you think you may have hypoglycemia, or if you have a history of not knowing when your blood glucose is getting low (hypoglycemia unawareness), do not use alternative sites. Use your finger instead. Alternative sites may not be as accurate as the fingers, because blood flow is slower in these areas. This means that the result you get may be delayed, and it may be different from the result that you would get from your finger. Follow these instructions at home: Blood glucose log   Every time you check your blood glucose, write down your result. Also write down any notes about things that may be affecting your blood glucose, such as your diet and exercise for the day. This information can help you and your health care provider: ? Look for patterns in your blood glucose over time. ? Adjust your diabetes management plan as  needed.  Check if your meter allows you to download your records to a computer. Most glucose meters store a record of glucose readings in the meter. If you have type 1 diabetes:  Check your blood glucose 2 or more times a day.  Also check your blood glucose: ? Before every insulin injection. ? Before and after exercise. ? Before meals. ? 2 hours after a meal. ? Occasionally between 2:00 a.m. and 3:00 a.m., as directed. ? Before potentially dangerous tasks, like driving or using heavy machinery. ? At bedtime.  You may need to check your blood glucose more often, up to 6-10 times a day, if you: ? Use an insulin pump. ? Need multiple daily injections (MDI). ? Have diabetes that is not well-controlled. ? Are ill. ? Have a history of severe hypoglycemia. ? Have hypoglycemia unawareness. If you have type 2 diabetes:  If you take insulin or other diabetes medicines, check your blood glucose 2 or more times a day.  If you are on intensive insulin therapy, check your blood glucose 4 or more times a day. Occasionally, you may also need to check between 2:00 a.m. and 3:00 a.m., as directed.  Also check your blood glucose: ? Before and after exercise. ? Before potentially dangerous tasks, like driving or using heavy machinery.  You may need to check your blood glucose more often if: ? Your medicine is being adjusted. ? Your diabetes is not well-controlled. ? You are ill. General tips  Always keep your supplies with you.  If you have questions or need help, all blood  glucose meters have a 24-hour "hotline" phone number that you can call. You may also contact your health care provider.  After you use a few boxes of test strips, adjust (calibrate) your blood glucose meter by following instructions that came with your meter. Contact a health care provider if:  Your blood glucose is at or above 240 mg/dL (13.3 mmol/L) for 2 days in a row.  You have been sick or have had a fever for 2  days or longer, and you are not getting better.  You have any of the following problems for more than 6 hours: ? You cannot eat or drink. ? You have nausea or vomiting. ? You have diarrhea. Get help right away if:  Your blood glucose is lower than 54 mg/dL (3 mmol/L).  You become confused or you have trouble thinking clearly.  You have difficulty breathing.  You have moderate or large ketone levels in your urine. Summary  Monitoring your blood sugar (glucose) is an important part of managing your diabetes (diabetes mellitus).  Blood glucose monitoring involves checking your blood glucose as often as directed and keeping a record (log) of your results over time.  Your health care provider will set individualized treatment goals for you. Your goals will be based on your age, other medical conditions you have, and how you respond to diabetes treatment.  Every time you check your blood glucose, write down your result. Also write down any notes about things that may be affecting your blood glucose, such as your diet and exercise for the day. This information is not intended to replace advice given to you by your health care provider. Make sure you discuss any questions you have with your health care provider. Document Released: 12/12/2003 Document Revised: 10/20/2017 Document Reviewed: 05/20/2016 Elsevier Interactive Patient Education  2019 [place].  Hypoglycemia Hypoglycemia is when the sugar (glucose) level in your blood is too low. Signs of low blood sugar may include:  Feeling: ? Hungry. ? Worried or nervous (anxious). ? Sweaty and clammy. ? Confused. ? Dizzy. ? Sleepy. ? Sick to your stomach (nauseous).  Having: ? A fast heartbeat. ? A headache. ? A change in your vision. ? Tingling or no feeling (numbness) around your mouth, lips, or tongue. ? Jerky movements that you cannot control (seizure).  Having trouble with: ? Moving  (coordination). ? Sleeping. ? Passing out (fainting). ? Getting upset easily (irritability). Low blood sugar can happen to people who have diabetes and people who do not have diabetes. Low blood sugar can happen quickly, and it can be an emergency. Treating low blood sugar Low blood sugar is often treated by eating or drinking something sugary right away, such as:  Fruit juice, 4-6 oz (120-150 mL).  Regular soda (not diet soda), 4-6 oz (120-150 mL).  Low-fat milk, 4 oz (120 mL).  Several pieces of hard candy.  Sugar or honey, 1 Tbsp (15 mL). Treating low blood sugar if you have diabetes If you can think clearly and swallow safely, follow the 15:15 rule:  Take 15 grams of a fast-acting carb (carbohydrate). Talk with your doctor about how much you should take.  Always keep a source of fast-acting carb with you, such as: ? Sugar tablets (glucose pills). Take 3-4 pills. ? 6-8 pieces of hard candy. ? 4-6 oz (120-150 mL) of fruit juice. ? 4-6 oz (120-150 mL) of regular (not diet) soda. ? 1 Tbsp (15 mL) honey or sugar.  Check your blood sugar 15 minutes  after you take the carb.  If your blood sugar is still at or below 70 mg/dL (3.9 mmol/L), take 15 grams of a carb again.  If your blood sugar does not go above 70 mg/dL (3.9 mmol/L) after 3 tries, get help right away.  After your blood sugar goes back to normal, eat a meal or a snack within 1 hour.  Treating very low blood sugar If your blood sugar is at or below 54 mg/dL (3 mmol/L), you have very low blood sugar (severe hypoglycemia). This may also cause:  Passing out.  Jerky movements you cannot control (seizure).  Losing consciousness (coma). This is an emergency. Do not wait to see if the symptoms will go away. Get medical help right away. Call your local emergency services (911 in the [place] Do not drive yourself to the hospital. If you have very low blood sugar and you cannot eat or drink, you may need a glucagon shot  (injection). A family member or friend should learn how to check your blood sugar and how to give you a glucagon shot. Ask your doctor if you need to have a glucagon shot kit at home. Follow these instructions at home: General instructions  Take over-the-counter and prescription medicines only as told by your doctor.  Stay aware of your blood sugar as told by your doctor.  Limit alcohol intake to no more than 1 drink a day for nonpregnant women and 2 drinks a day for men. One drink equals 12 oz of beer (355 mL), 5 oz of wine (148 mL), or 1 oz of hard liquor (44 mL).  Keep all follow-up visits as told by your doctor. This is important. If you have diabetes:   Follow your diabetes care plan as told by your doctor. Make sure you: ? Know the signs of low blood sugar. ? Take your medicines as told. ? Follow your exercise and meal plan. ? Eat on time. Do not skip meals. ? Check your blood sugar as often as told by your doctor. Always check it before and after exercise. ? Follow your sick day plan when you cannot eat or drink normally. Make this plan ahead of time with your doctor.  Share your diabetes care plan with: ? Your work or school. ? People you live with.  Check your pee (urine) for ketones: ? When you are sick. ? As told by your doctor.  Carry a card or wear jewelry that says you have diabetes. Contact a doctor if:  You have trouble keeping your blood sugar in your target range.  You have low blood sugar often. Get help right away if:  You still have symptoms after you eat or drink something sugary.  Your blood sugar is at or below 54 mg/dL (3 mmol/L).  You have jerky movements that you cannot control.  You pass out. These symptoms may be an emergency. Do not wait to see if the symptoms will go away. Get medical help right away. Call your local emergency services (911 in the [place] Do not drive yourself to the hospital. Summary  Hypoglycemia happens when the level  of sugar (glucose) in your blood is too low.  Low blood sugar can happen to people who have diabetes and people who do not have diabetes. Low blood sugar can happen quickly, and it can be an emergency.  Make sure you know the signs of low blood sugar and know how to treat it.  Always keep a source of sugar (fast-acting  carb) with you to treat low blood sugar. This information is not intended to replace advice given to you by your health care provider. Make sure you discuss any questions you have with your health care provider. Document Released: 03/05/2010 Document Revised: 06/02/2018 Document Reviewed: 01/12/2016 Elsevier Interactive Patient Education  2019 [employer].

## 2019-05-25 NOTE — Progress Notes (Signed)
Spoke with attending this afternoon, he said he would put the discharge in but wanted to wait to see what his dinner time CBG was since his sugar at lunch was 305. I attempted to message the attending with the 1653 CBG of 170 but was unable to reach him to confirm he was still ok to discharge. Also upon looking at the discharge paperwork RN noticed there was insulin ordered but no insulin syringes and this is the first time the pt would be giving himself insulin at home so he has no home supplies and would be unable to give himself the insulin he needed at home.

## 2019-05-25 NOTE — Discharge Summary (Signed)
Physician Discharge Summary  Patient ID: Walter Harmon MRN: [id number] DOB/AGE: 1966/12/09 53 y.o.  Admit date: 05/24/2019 Discharge date: 05/25/2019  Admission Diagnoses: Acute coronary syndrome HTN Hiccups Asthma  Discharge Diagnoses:  Principle problem: Hiccups Active Problems:   Abnormal EKG   Acute renal failure, improving   Hypertension   Type 2 DM   Asthma   Dietary and medication non-compliance   CKD, IV  Discharged Condition: fair  Hospital Course: 53 years old male with PMH of hypertension, type 2 DM and asthma had intractable hiccups for over 12 hours prior to admission which improved with pantoprazole and Reglan use. He admitted to dietary and medications non-compliance when he learned his Hgb A1C was very high compared to usual control 5 years back.  His troponin I levels were normal. His EKG showed chronic ST elevation in anterior leads.  He did not have chest pain, shortness of breath or fever. His blood sugar levels improved with insulin by sliding scale and Lantus 20 units SQ.  His renal function improves somewhat with IV fluids. He will see primary care doctor in 1 week and see me in 2 weeks as needed. He will see nephrology on OP basis. Diabetic teaching was provided by nurse and educator.  Social Psychologist, prison and probation services also recommended.  Consults: cardiology  Significant Diagnostic Studies: labs: Low sodium with high blood sugar improves with IV saline and lowering of sugar. Normal potassium level, BUN 50 and Cr 4.05.  EKG: Sinus rhythm with LVH with repolarization abnormality, similar to 5 years ago tracing..  Echocardiogram : Normal LV systolic function with mild LVH.  Treatments: cardiac meds: amlodipine, SL NTG, atorvastatin, aspirin, hydralazine. GI meds: Pantoprazole and Reglan. Diabetic medication: Lantus and Novolog  Discharge Exam: Blood pressure 113/78, pulse (!) 101, temperature 98.4 F (36.9 C), temperature source Oral, resp. rate 18, height  5\' 5"  (1.651 m), weight 72 kg, SpO2 97 %. General appearance: alert, cooperative and appears stated age. Head: Normocephalic, atraumatic. Eyes: Brown eyes, pink conjunctiva, corneas clear. PERRL, EOM's intact.  Neck: No adenopathy, no carotid bruit, no JVD, supple, symmetrical, trachea midline and thyroid not enlarged. Resp: Clear to auscultation bilaterally. Cardio: Regular rate and rhythm, S1, S2 normal, II/VI systolic murmur, no click, rub or gallop. GI: Soft, non-tender; bowel sounds normal; no organomegaly. Extremities: No edema, cyanosis or clubbing. Skin: Warm and dry.  Neurologic: Alert and oriented X 3, normal strength and tone. Normal coordination and gait.  Disposition: Discharge disposition: 01-Home or Self Care        Allergies as of 05/25/2019   No Known Allergies     Medication List    TAKE these medications   allopurinol 100 MG tablet Commonly known as:  ZYLOPRIM Take 1 tablet (100 mg total) by mouth daily.   amLODipine 5 MG tablet Commonly known as:  NORVASC Take 1 tablet (5 mg total) by mouth daily. Start taking on:  May 26, 2019   aspirin 81 MG EC tablet Take 1 tablet (81 mg total) by mouth daily. Start taking on:  May 26, 2019   atorvastatin 40 MG tablet Commonly known as:  LIPITOR Take 1 tablet (40 mg total) by mouth daily at 6 PM.   azithromycin 250 MG tablet Commonly known as:  ZITHROMAX Take 2 tablets (500 mg) on day 1, then take 1 tablet (250 mg) on days 2-5   bisacodyl 5 MG EC tablet Commonly known as:  DULCOLAX Take 1 tablet (5 mg total) by mouth daily as needed  for moderate constipation.   budesonide-formoterol 160-4.5 MCG/ACT inhaler Commonly known as:  Symbicort INHALE TWO PUFFS INTO THE LUNGS DAILY What changed:    how much to take  how to take this  when to take this   feeding supplement (GLUCERNA SHAKE) Liqd Take 237 mLs by mouth daily.   hydrALAZINE 25 MG tablet Commonly known as:  APRESOLINE Take 1 tablet (25 mg  total) by mouth 2 (two) times daily.   insulin aspart 100 UNIT/ML injection Commonly known as:  novoLOG Inject 0-15 Units into the skin 3 (three) times daily with meals.   insulin glargine 100 UNIT/ML injection Commonly known as:  LANTUS Inject 0.2 mLs (20 Units total) into the skin daily. Start taking on:  May 26, 2019   living well with diabetes book Misc 1 each by Does not apply route once for 1 dose.   metoCLOPramide 5 MG tablet Commonly known as:  REGLAN Take 1 tablet (5 mg total) by mouth every 8 (eight) hours as needed for nausea.   multivitamin with minerals Tabs tablet Take 1 tablet by mouth daily. Start taking on:  May 26, 2019   nitroGLYCERIN 0.4 MG SL tablet Commonly known as:  NITROSTAT Place 1 tablet (0.4 mg total) under the tongue every 5 (five) minutes x 3 doses as needed for chest pain.   pantoprazole 40 MG tablet Commonly known as:  PROTONIX Take 1 tablet (40 mg total) by mouth daily at 12 noon. Start taking on:  May 26, 2019   predniSONE 10 MG tablet Commonly known as:  DELTASONE Take 4 tabs for 2 days, then 3 tabs for 2 days, then 2 tabs for 2 days, then 1 tab for 2 days, then stop   ProAir HFA 108 (90 Base) MCG/ACT inhaler Generic drug:  albuterol INHALE TWO PUFFS BY MOUTH EVERY 6 HOURS AS NEEDED FOR WHEEZING What changed:    how much to take  how to take this  when to take this  reasons to take this  additional instructions      Follow-up Information    Dixie Dials, MD. Schedule an appointment as soon as possible for a visit in 2 week(s).   Specialty:  Cardiology Why:  as needed Contact information: [place] [address] [phone]        [place]. Schedule an appointment as soon as possible for a visit in 1 week(s).   Why:  Check blood sugars 2-3 times daily and bring record Contact information: [place] [address] [phone]           Signed: Birdie Riddle 05/25/2019, 1:23 PM

## 2019-05-25 NOTE — TOC Initial Note (Signed)
Transition of Care [facility]) - Initial/Assessment Note    Patient Details  Name: Walter Harmon MRN: [id number] Date of Birth: 27-Jul-1966  [facility]) CM/SW Contact:    Laurena Slimmer, RN Phone Number: 05/25/2019, 7:34 PM  Clinical Narrative:                 ED CM received call from [place] concerning patient being discharged home tonight and he is newly diagnosed diabetic and does not have supplies.  ED CM reviewed patient record then contacted patient who verified he did not receive discharge medications from [facility].  Patient also confirmed he does not have a PCP either/         Patient Goals and CMS Choice    " I want to get better"    Expected Discharge Plan and Services    Patient is being discharged home tonight with prescriptions, patient is uninsured Medicaid Potential  and not able to afford scripts.  Discussed MATCH medication assistance, patient is agreeable patient was enrolled and letter printed and handed to patient with instruction on how to redeem,  Also diiscuss f/u with the [facility] permission given to forward information to clinic's CM.  Reviewed information with  patient verbalized understand and teach back done.  No further ED CM needs identified.        Expected Discharge Date: 05/25/19                                                           Activities of Daily Living Home Assistive Devices/Equipment: None ADL Screening (condition at time of admission) Patient's cognitive ability adequate to safely complete daily activities?: Yes Is the patient deaf or have difficulty hearing?: No Does the patient have difficulty seeing, even when wearing glasses/contacts?: No Does the patient have difficulty concentrating, remembering, or making decisions?: No Patient able to express need for assistance with ADLs?: Yes Does the patient have difficulty dressing or bathing?: No Independently performs ADLs?:  No Communication: Independent Dressing (OT): Independent Grooming: Independent Feeding: Independent Bathing: Independent Toileting: Independent In/Out Bed: Independent Walks in Home: Independent Does the patient have difficulty walking or climbing stairs?: No Weakness of Legs: None Weakness of Arms/Hands: None                               Admission diagnosis:  Abnormal EKG [R94.31] Intractable hiccups [R06.6] Chest pain [R07.9] Patient Active Problem List   Diagnosis Date Noted  . Acute coronary syndrome ([place]) 05/24/2019  . Exacerbation of asthma 05/21/2019  . Allergic rhinitis 02/27/2017  . CKD (chronic kidney disease), stage III ([place]) 08/12/2012  . Tachycardia 08/12/2012  . Asthma, chronic 08/10/2012  . Diabetes type 2, controlled ([place]) 08/10/2012  . ANAL FISSURE 07/13/2008   PCP:  Patient, No Pcp Per Pharmacy:   [place], [place] - [address] [place] [address] Phone: [phone] Fax: [place], [place] [address] [place] [address] Phone: [phone] Fax: [phone]     Social Determinants of Health (SDOH) Interventions    Readmission Risk Interventions No flowsheet data found.

## 2019-05-25 NOTE — Progress Notes (Signed)
Initial Nutrition Assessment  RD working remotely.  DOCUMENTATION CODES:   Not applicable  INTERVENTION:   - Glucerna Shake po BID, each supplement provides 220 kcal and 10 grams of protein  - MVI with minerals daily  - Pt may benefit from further DM diet education, will attach "[place] for People with Diabetes" handout from the Academy of Nutrition and Dietetics to pt's discharge instructions  NUTRITION DIAGNOSIS:   Inadequate oral intake related to decreased appetite as evidenced by per patient/family report.  GOAL:   Patient will meet greater than or equal to 90% of their needs  MONITOR:   PO intake, Labs, Supplement acceptance, Weight trends  REASON FOR ASSESSMENT:   Malnutrition Screening Tool    ASSESSMENT:   53 year old male who presented to the ED on 6/01 with intractable hiccups. PMH of HTN, T2DM. Pt is COVID-19 positive.  Spoke with pt via phone call to room. Pt not very forthcoming with nutrition history, answering RD questions in 1-2 words and not providing many details.  Pt reports that his appetite is "so-so" and has been for about 1 week. Pt states that his appetite decreases when he is sick.  Pt reports that his appetite is normally fine and that he does not have any issues with nutrition when he is not sick.  Pt endorses experiencing recent weight loss. Pt reports his UBW as 160 lbs and that he noticed he now weighs around 155 lbs.  Pt states that he did not eat much at breakfast but was able to eat his fruit cup and banana. Pt denies any N/V at this time. Pt states that hiccups are persisting "on and off" despite Reglan.  RD encouraged adequate PO intake with a focus on protein-rich foods.  No meal completions recorded since admission.  Pt amenable to RD ordering protein shake to aid pt in meeting kcal and protein needs. Will order Glucerna Shake BID. Will also order daily MVI  Medications reviewed and include: SSI, Lantus 15 units  daily, Protonix IVF: NS @ 100 ml/hr  Labs reviewed: sodium 134 (L), BUN 45 (H), creatinine 3.37 (H), hemoglobin A1C 10.0 (H), cholesterol 203 (H), LDL 143 (H) CBG's: 182-308 x 24 hours  NUTRITION - FOCUSED PHYSICAL EXAM:  Unable to complete at this time. RD working remotely.  Diet Order:   Diet Order            Diet Carb Modified Fluid consistency: Thin; Room service appropriate? Yes  Diet effective now              EDUCATION NEEDS:   Education needs have been addressed  Skin:  Skin Assessment: Reviewed RN Assessment  Last BM:  05/15/19  Height:   Ht Readings from Last 1 Encounters:  05/24/19 5\' 5"  (1.651 m)    Weight:   Wt Readings from Last 1 Encounters:  05/25/19 72 kg    Ideal Body Weight:  61.8 kg  BMI:  Body mass index is 26.41 kg/m.  Estimated Nutritional Needs:   Kcal:  2000-2200  Protein:  100-115 grams  Fluid:  >/= 2.0 L    Gaynell Face, MS, RD, LDN Inpatient Clinical Dietitian Pager: [phone] Weekend/After Hours: [phone]

## 2019-05-25 NOTE — TOC Transition Note (Signed)
Transition of Care [facility]) - CM/SW Discharge Note  SEE Initial TOC Note   Patient Details  Name: Walter Harmon MRN: [id number] Date of Birth: Jul 13, 1966  [facility]) CM/SW Contact:  Laurena Slimmer, RN Phone Number: 05/25/2019, 7:46 PM   Clinical Narrative:             Patient Goals and CMS Choice        Discharge Placement                       Discharge Plan and Services                                     Social Determinants of Health (SDOH) Interventions     Readmission Risk Interventions No flowsheet data found.

## 2019-05-25 NOTE — Progress Notes (Addendum)
Inpatient Diabetes Program Recommendations  AACE/ADA: New Consensus Statement on Inpatient Glycemic Control (2015)  Target Ranges:  Prepandial:   less than 140 mg/dL      Peak postprandial:   less than 180 mg/dL (1-2 hours)      Critically ill patients:  140 - 180 mg/dL   Lab Results  Component Value Date   GLUCAP 305 (H) 05/25/2019   HGBA1C 10.0 (H) 05/24/2019    Review of Glycemic Control  Diabetes history: DM2 Outpatient Diabetes medications: None   Received diabetic consult. Patient going home on insulin for the first time at discharge later today. Has been Type 2 diabetic but not on any home meds. Last hgb A1c documented prior to this admission was 7.4% in 2015. Now is 10% (average of 240mg /dl over past 2-3 months). Called and informed patient about his 10% A1c and explained what an A1c is and what it measures. Reminded patient that goal A1c is 7% or less per ADA standards to prevent both acute and long-term complications.   Explained to patient the extreme importance of good glucose control at home. He is going to purchase a glucometer. He says he knows how to use it as his mom was diabetic and he used to help her. Encouraged patient to check CBGs at home before meals and write it down to take to his follow up appointment. Reviewed s/s of hypoglycemia and how to treat it. Saw dietary has connected with patient regarding CM diet.  Living well with diabetes book ordered and bedside RN is giving patient that book this afternoon.   Talked with patient and explained that he is going home on a daily basal (long acting) insulin as well as a shorter acting insulin (before meals). Explained the Novolog insulin before meals is a scale depending on what his blood sugar is.  Discussed insulin pen as well as vial and syringe options for patient. Information printed in AVS which will go home with patient as well as will send email link (he granted permission) with short video on how to give insulin  via vial/syringe as well as a video on insulin pen. Spoke to bedside RN Suezanne Jacquet who is going to review both methods with patient prior to d/c. Spoke to [place] who is arranging patient to have his meds delivered to his room via [place] prior to d/c home. She is also going to set up a follow up appt for him at [facility]. Patient told me he had no more questions.  -- Will follow during hospitalization.--  Jonna Clark RN, MSN Diabetes Coordinator Inpatient Glycemic Control Team Team Pager: [phone] (8am-5pm)

## 2019-05-26 LAB — BASIC METABOLIC PANEL
Anion gap: 10 (ref 5–15)
BUN: 26 mg/dL — ABNORMAL HIGH (ref 6–20)
CO2: 18 mmol/L — ABNORMAL LOW (ref 22–32)
Calcium: 8.5 mg/dL — ABNORMAL LOW (ref 8.9–10.3)
Chloride: 109 mmol/L (ref 98–111)
Creatinine, Ser: 2.92 mg/dL — ABNORMAL HIGH (ref 0.61–1.24)
GFR calc Af Amer: 28 mL/min — ABNORMAL LOW (ref >60)
GFR calc non Af Amer: 24 mL/min — ABNORMAL LOW (ref >60)
Glucose, Bld: 140 mg/dL — ABNORMAL HIGH (ref 70–99)
Potassium: 4 mmol/L (ref 3.5–5.1)
Sodium: 137 mmol/L (ref 135–145)

## 2019-05-26 MED ORDER — ATORVASTATIN CALCIUM 40 MG PO TABS: 40.0000 mg | ORAL_TABLET | Freq: Every day | ORAL | 3 refills | Status: DC

## 2019-05-26 MED ORDER — METOCLOPRAMIDE HCL 5 MG PO TABS: 5.0000 mg | ORAL_TABLET | Freq: Three times a day (TID) | ORAL | 1 refills | Status: DC | PRN

## 2019-05-26 MED ORDER — INSULIN ASPART 100 UNIT/ML FLEXPEN: 0.0000 [IU] | PEN_INJECTOR | Freq: Three times a day (TID) | SUBCUTANEOUS | 11 refills | Status: DC

## 2019-05-26 MED ORDER — AMLODIPINE BESYLATE 5 MG PO TABS: 5.0000 mg | ORAL_TABLET | Freq: Every day | ORAL | 3 refills | Status: DC

## 2019-05-26 MED ORDER — HYDRALAZINE HCL 25 MG PO TABS: 25.0000 mg | ORAL_TABLET | Freq: Two times a day (BID) | ORAL | 3 refills | Status: DC

## 2019-05-26 MED ORDER — NITROGLYCERIN 0.4 MG SL SUBL: 0.4000 mg | SUBLINGUAL_TABLET | SUBLINGUAL | 1 refills | Status: DC | PRN

## 2019-05-26 MED ORDER — INSULIN GLARGINE 100 UNIT/ML SOLOSTAR PEN: 20.0000 [IU] | PEN_INJECTOR | Freq: Every day | SUBCUTANEOUS | 11 refills | Status: DC

## 2019-05-26 MED ORDER — PEN NEEDLES 30G X 8 MM MISC: 1.0000 | 11 refills | Status: DC

## 2019-05-26 MED ORDER — ALLOPURINOL 100 MG PO TABS: 100.0000 mg | ORAL_TABLET | Freq: Every day | ORAL | 2 refills | Status: DC

## 2019-05-26 MED ORDER — BLOOD GLUCOSE MONITOR KIT: PACK | 0 refills | Status: DC

## 2019-05-26 MED ORDER — ASPIRIN 81 MG PO TBEC: 81.0000 mg | DELAYED_RELEASE_TABLET | Freq: Every day | ORAL | Status: DC

## 2019-05-26 MED ORDER — PANTOPRAZOLE SODIUM 40 MG PO TBEC: 40.0000 mg | DELAYED_RELEASE_TABLET | Freq: Every day | ORAL | 1 refills | Status: DC

## 2019-05-26 MED FILL — hydrALAZINE HCL 25 MG TABS: 25 | 30 days supply | Qty: 60 | Fill #0

## 2019-05-26 MED FILL — AMLODIPINE BESYLATE 5 MG TA: 5 | 30 days supply | Qty: 30 | Fill #0

## 2019-05-26 MED FILL — ATORVASTATIN CALCIUM 40 MG: 40 | 30 days supply | Qty: 30 | Fill #0

## 2019-05-26 MED FILL — PENTIPS 31G X 8 MM MISC: 31G X 8 MM | 25 days supply | Qty: 100 | Fill #0

## 2019-05-26 MED FILL — PANTOPRAZOLE SOD DR 40 MG T: 40 | 30 days supply | Qty: 30 | Fill #0

## 2019-05-26 MED FILL — METOCLOPRAMIDE 5 MG TABLET: 5 | 30 days supply | Qty: 90 | Fill #0

## 2019-05-26 MED FILL — NOVOLOG FLEXPEN SYRINGE: 100 | 30 days supply | Qty: 15 | Fill #0

## 2019-05-26 MED FILL — LANTUS SOLOSTAR 100 UNITS/M: 100 | 30 days supply | Qty: 6 | Fill #0

## 2019-05-26 MED FILL — NITROGLYCERIN 0.4 MG TAB SL: 0.4 | 8 days supply | Qty: 25 | Fill #0

## 2019-05-26 MED FILL — ALLOPURINOL 100 MG TABLET: 100 | 30 days supply | Qty: 30 | Fill #0

## 2019-05-26 NOTE — Progress Notes (Signed)
Ref: Patient, No Pcp Per   Subjective:  Discharge delayed due to curfew in city and medications not available to continue care at home. Patient aware of self quarantine for 2 weeks at home. He understood need to check blood sugars 3 times a day and take his medications regularly. His creatinine has further improved to 2.92. He is aware of need to see renal doctor after seeing primary care doctor for diabetes and hypertension control. VS stable. Blood sugar is down to 140 mg. Today.  Objective:  Vital Signs in the last 24 hours: Temp:  [98 F (36.7 C)-98.9 F (37.2 C)] 98.9 F (37.2 C) (06/03 0016) Pulse Rate:  [102-103] 102 (06/03 0016) Cardiac Rhythm: Normal sinus rhythm (06/03 0700) Resp:  [18-20] 18 (06/03 0016) BP: (113-141)/(75-88) 116/75 (06/03 0016) SpO2:  [98 %-100 %] 100 % (06/03 0016) Weight:  [72.3 kg] 72.3 kg (06/03 0500)  Physical Exam: BP Readings from Last 1 Encounters:  05/26/19 116/75     Wt Readings from Last 1 Encounters:  05/26/19 72.3 kg    Weight change: 0.3 kg Body mass index is 26.52 kg/m. HEENT: [place]/AT, Eyes-Brown, PERL, EOMI, Conjunctiva-Pink, Sclera-Non-icteric Neck: No JVD, No bruit, Trachea midline. Lungs:  Clear, Bilateral. Cardiac:  Regular rhythm, normal S1 and S2, no S3. II/VI systolic murmur. Abdomen:  Soft, non-tender. BS present. Extremities:  No edema present. No cyanosis. No clubbing. CNS: AxOx3, Cranial nerves grossly intact, moves all 4 extremities.  Skin: Warm and dry.   Intake/Output from previous day: 06/02 0701 - 06/03 0700 In: 1101.7 [I.V.:1101.7] Out: 1800 [Urine:1800]    Lab Results: BMET    Component Value Date/Time   NA 137 05/26/2019 0605   NA 134 (L) 05/25/2019 0421   NA 128 (L) 05/24/2019 0248   K 4.0 05/26/2019 0605   K 4.3 05/25/2019 0421   K 5.1 05/24/2019 0248   CL 109 05/26/2019 0605   CL 104 05/25/2019 0421   CL 96 (L) 05/24/2019 0248   CO2 18 (L) 05/26/2019 0605   CO2 20 (L) 05/25/2019 0421   CO2 17  (L) 05/24/2019 0248   GLUCOSE 140 (H) 05/26/2019 0605   GLUCOSE 311 (H) 05/25/2019 0421   GLUCOSE 424 (H) 05/24/2019 0248   BUN 26 (H) 05/26/2019 0605   BUN 45 (H) 05/25/2019 0421   BUN 50 (H) 05/24/2019 0248   CREATININE 2.92 (H) 05/26/2019 0605   CREATININE 3.37 (H) 05/25/2019 0421   CREATININE 4.05 (H) 05/24/2019 0248   CALCIUM 8.5 (L) 05/26/2019 0605   CALCIUM 8.5 (L) 05/25/2019 0421   CALCIUM 9.4 05/24/2019 0248   GFRNONAA 24 (L) 05/26/2019 0605   GFRNONAA 20 (L) 05/25/2019 0421   GFRNONAA 16 (L) 05/24/2019 0248   GFRAA 28 (L) 05/26/2019 0605   GFRAA 23 (L) 05/25/2019 0421   GFRAA 19 (L) 05/24/2019 0248   CBC    Component Value Date/Time   WBC 10.1 05/24/2019 0248   RBC 5.00 05/24/2019 0248   HGB 15.3 05/24/2019 0248   HCT 44.7 05/24/2019 0248   PLT 272 05/24/2019 0248   MCV 89.4 05/24/2019 0248   MCH 30.6 05/24/2019 0248   MCHC 34.2 05/24/2019 0248   RDW 11.9 05/24/2019 0248   LYMPHSABS 1.2 05/30/2014 0259   MONOABS 0.9 05/30/2014 0259   EOSABS 0.1 05/30/2014 0259   BASOSABS 0.0 05/30/2014 0259   HEPATIC Function Panel No results for input(s): PROT in the last 8760 hours.  Invalid input(s):  ALBUMIN,  AST,  ALT,  ALKPHOS,  BILIDIR,  IBILI HEMOGLOBIN A1C No components found for: HGA1C,  MPG CARDIAC ENZYMES Lab Results  Component Value Date   CKTOTAL 348 (H) 08/10/2012   CKMB 4.0 08/10/2012   TROPONINI <0.03 05/24/2019   TROPONINI <0.03 05/24/2019   TROPONINI <0.03 05/24/2019   BNP No results for input(s): PROBNP in the last 8760 hours. TSH No results for input(s): TSH in the last 8760 hours. CHOLESTEROL Recent Labs    05/24/19 0401  CHOL 203*    Scheduled Meds: . allopurinol  100 mg Oral Daily  . amLODipine  5 mg Oral Daily  . aspirin EC  81 mg Oral Daily  . atorvastatin  40 mg Oral q1800  . feeding supplement (GLUCERNA SHAKE)  237 mL Oral BID BM  . heparin injection (subcutaneous)  5,000 Units Subcutaneous Q8H  . hydrALAZINE  25 mg Oral Q8H   . insulin aspart  0-15 Units Subcutaneous TID WC  . insulin glargine  15 Units Subcutaneous Daily  . mometasone-formoterol  2 puff Inhalation BID  . multivitamin with minerals  1 tablet Oral Daily  . pantoprazole  40 mg Oral Q1200  . sodium chloride flush  3 mL Intravenous Once   Continuous Infusions: . sodium chloride Stopped (05/26/19 0049)   PRN Meds:.acetaminophen, albuterol, bisacodyl, metoCLOPramide, nitroGLYCERIN, ondansetron (ZOFRAN) IV  Assessment/Plan: Hiccups, controlled Abnormal EKG, chronic changes Acute renal failure, improving Hypertension, controlled now Type 2 DM, controlled now Asthma Dietary and medication non-compliance  Continue current treatment. Home today post medications and supply.   LOS: 0 days    Dixie Dials  MD  05/26/2019, 10:43 AM

## 2019-05-26 NOTE — Progress Notes (Addendum)
D/C delayed.  Patient to be d/c'd home today.  Per RN, [place] pharmacy to bring by patient's medications. Patient has been taught injection of insulin using vial and syringe.  RN states he will review again prior to d/c.   Thanks, Adah Perl, RN, BC-ADM Inpatient Diabetes Coordinator Pager [phone] (8a-5p)

## 2019-06-02 NOTE — Progress Notes (Signed)
Patient ID: Walter Harmon, male   DOB: [birth year], 53 y.o.   MRN: [id number] Virtual Visit via Telephone Note  I connected with Walter Harmon on 06/03/19 at  3:50 PM EDT by telephone and verified that I am speaking with the correct person using two identifiers.   I discussed the limitations, risks, security and privacy concerns of performing an evaluation and management service by telephone and the availability of in person appointments. I also discussed with the patient that there may be a patient responsible charge related to this service. The patient expressed understanding and agreed to proceed.  Patient location:  home My Location:  [facility] office Persons on the call:  Myself and the patient  History of Present Illness: After hospitalization 6/01-6/01/2019.  A1C=10.0.  Still having some hiccups despite taking reglan.  Blood sugars running from 150-270 depending on diet.  He did get all of his meds filled and is taking them as prescribed.  He feels good.  No cough, no fever.  Tested +Covid-19 but has not had s/sx.    From discharge summary: Admit date: 05/24/2019 Discharge date: 05/25/2019  Admission Diagnoses: Acute coronary syndrome HTN Hiccups Asthma  Discharge Diagnoses:  Principle problem: Hiccups Active Problems:   Abnormal EKG   Acute renal failure, improving   Hypertension   Type 2 DM   Asthma   Dietary and medication non-compliance   CKD, IV  Discharged Condition: fair  Hospital Course: 53 years old male with PMH of hypertension, type 2 DM and asthma had intractable hiccups for over 12 hours prior to admission which improved with pantoprazole and Reglan use. He admitted to dietary and medications non-compliance when he learned his Hgb A1C was very high compared to usual control 5 years back.  His troponin I levels were normal. His EKG showed chronic ST elevation in anterior leads.  He did not have chest pain, shortness of breath or fever. His blood sugar  levels improved with insulin by sliding scale and Lantus 20 units SQ.  His renal function improves somewhat with IV fluids. He will see primary care doctor in 1 week and see me in 2 weeks as needed. He will see nephrology on OP basis. Diabetic teaching was provided by nurse and educator.  Social Psychologist, prison and probation services also recommended.  Consults: cardiology  Significant Diagnostic Studies: labs: Low sodium with high blood sugar improves with IV saline and lowering of sugar. Normal potassium Harmon, BUN 50 and Cr 4.05.  EKG: Sinus rhythm with LVH with repolarization abnormality, similar to 5 years ago tracing..  Echocardiogram : Normal LV systolic function with mild LVH.  Treatments: cardiac meds: amlodipine, SL NTG, atorvastatin, aspirin, hydralazine. GI meds: Pantoprazole and Reglan. Diabetic medication: Lantus and Novolog   Observations/Objective:  A&Ox3   Assessment and Plan: 1. Intractable hiccups Increase dose of reglan as needed(only taking when he has hiccups) - metoCLOPramide (REGLAN) 5 MG tablet; Take 2 tablets (10 mg total) by mouth 3 (three) times daily before meals. Prn hiccups/nausea  Dispense: 90 tablet; Refill: 1  2. Controlled type 2 diabetes mellitus with hyperglycemia, without long-term current use of insulin (HCC) Improving but not at goal.  Continue current regimen.  Eliminate sugars/white carbs from diet.  continue TID glucose checks, record, and bring to f/up.    3. Hypertension, unspecified type Check BP OOO 3x/week and record.  Bring to f/up  4. Hospital discharge follow-up Improving.   5.  Tested +Covid 19;  Asymptomatic; following quarantine guidelines.  Follow Up Instructions: appt in 1 month to be assigned PCP/BP/DM f/up    I discussed the assessment and treatment plan with the patient. The patient was provided an opportunity to ask questions and all were answered. The patient agreed with the plan and demonstrated an understanding of the  instructions.   The patient was advised to call back or seek an in-person evaluation if the symptoms worsen or if the condition fails to improve as anticipated.  I provided 14 minutes of non-face-to-face time during this encounter.   Freeman Caldron, PA-C

## 2019-06-03 ENCOUNTER — Ambulatory Visit: Payer: Self-pay | Attending: Family Medicine | Admitting: Physician Assistant

## 2019-06-03 ENCOUNTER — Other Ambulatory Visit: Payer: Self-pay

## 2019-06-03 DIAGNOSIS — R066 Hiccough: Secondary | ICD-10-CM

## 2019-06-03 DIAGNOSIS — Z09 Encounter for follow-up examination after completed treatment for conditions other than malignant neoplasm: Secondary | ICD-10-CM

## 2019-06-03 DIAGNOSIS — E1165 Type 2 diabetes mellitus with hyperglycemia: Secondary | ICD-10-CM

## 2019-06-03 DIAGNOSIS — I10 Essential (primary) hypertension: Secondary | ICD-10-CM

## 2019-06-03 MED ORDER — METOCLOPRAMIDE HCL 5 MG PO TABS: 10.0000 mg | ORAL_TABLET | Freq: Three times a day (TID) | ORAL | 1 refills | Status: DC

## 2019-07-15 ENCOUNTER — Other Ambulatory Visit: Payer: Self-pay

## 2019-07-15 ENCOUNTER — Encounter: Payer: Self-pay | Admitting: Family Medicine

## 2019-07-15 ENCOUNTER — Ambulatory Visit: Payer: MEDICAID | Attending: Family Medicine | Admitting: Family Medicine

## 2019-07-15 DIAGNOSIS — N183 Chronic kidney disease, stage 3 unspecified: Secondary | ICD-10-CM

## 2019-07-15 DIAGNOSIS — Z794 Long term (current) use of insulin: Secondary | ICD-10-CM

## 2019-07-15 DIAGNOSIS — E1165 Type 2 diabetes mellitus with hyperglycemia: Secondary | ICD-10-CM

## 2019-07-15 NOTE — Progress Notes (Signed)
Virtual Visit via Telephone Note  I connected with Walter Harmon  on 07/15/19 at  3:30 PM EDT by telephone and verified that I am speaking with the correct person using two identifiers.   I discussed the limitations, risks, security and privacy concerns of performing an evaluation and management service by telephone and the availability of in person appointments. I also discussed with the patient that there may be a patient responsible charge related to this service. The patient expressed understanding and agreed to proceed.  Patient Location: Home Provider Location: Office at [place] Others participating in call: none   History of Present Illness:       53 year old male who is seen in follow-up of recent new patient visit on 06/02/2021 by another provider.  At that time, patient was having issues with intractable hiccups.  He states that after 10 days of the use of Reglan prescribed at his last visit, the hiccups finally resolved.  He reports that he is using the Lantus for his diabetes and his blood sugars have been generally 140s to 160s and sometimes slightly less fasting.  He is keeping track of his blood pressure as well as his blood sugars on a log.  Patient also has chronic kidney disease with most recent creatinine of 2.92 on 05/26/2019.  He is not currently established with a nephrologist.  He reports that he is about to run out of his Lantus and he is not sure if he will be able to afford the same medication.      Patient reports improvement in his blood sugars.  He denies any increased thirst or urinary frequency at this time.  Patient with prior positive test for COVID-19 but has had no shortness of breath or cough, no fever or chills, no body aches, no nausea, vomiting or diarrhea.  No sore throat or rash.   Past Medical History:  Diagnosis Date  . Asthma   . Diabetes mellitus   . Hypertension     Past Surgical History:  Procedure Laterality Date  . ANKLE SURGERY      Family History    Problem Relation Age of Onset  . Diabetes type II Mother     Social History   Tobacco Use  . Smoking status: Never Smoker  . Smokeless tobacco: Never Used  Substance Use Topics  . Alcohol use: No  . Drug use: No     No Known Allergies     Observations/Objective: No vital signs or physical exam conducted as visit was done via telephone  Assessment and Plan: 1. Stage 3 chronic kidney disease ([place]) Patient is aware that he should avoid the use of nonsteroidal anti-inflammatories and continue to make sure that his blood pressure and diabetes are well controlled.  Patient will be referred to nephrology for further evaluation and treatment/follow-up of stage III chronic kidney disease. - Ambulatory referral to Nephrology  2. Uncontrolled type 2 diabetes mellitus with hyperglycemia, with long-term current use of insulin ([place]) Patient was asked to contact this pharmacy regarding the cost of his Lantus as well as contacting [place] where he normally has his medications filled in order to find out about the cost of Lantus versus 70/30 insulin.  Patient is encouraged to contact the office if he will be unable to afford Lantus refills so that other alternatives such as patient assistance program or change in medication can be done to ensure that he is able to continue treatment for his type 2 diabetes.  He does report  that blood sugar control has improved with restart of both long and short acting insulin.  On review of chart, patient's most recent hemoglobin A1c was 10.0 on 05/24/2019.  Follow Up Instructions:Return in about 2 months (around 09/15/2019) for DM/chronic issues and as needed.    I discussed the assessment and treatment plan with the patient. The patient was provided an opportunity to ask questions and all were answered. The patient agreed with the plan and demonstrated an understanding of the instructions.   The patient was advised to call back or seek an in-person evaluation if the  symptoms worsen or if the condition fails to improve as anticipated.  I provided 11 minutes of non-face-to-face time during this encounter.   Antony Blackbird, MD

## 2019-07-26 ENCOUNTER — Telehealth: Payer: Self-pay | Admitting: Emergency Medicine

## 2019-07-26 NOTE — Telephone Encounter (Signed)
Called and spoke with pt letting him know that we did have samples of Symbicort but also stated to him that he was due for 3 month follow up per TN and asked if I could go ahead and get him scheduled for that appt. Pt verbalized understanding and stated that we could make the appt. Pt has been scheduled for a 3 month follow up with RB tomorrow, 8/4 at 3:45 and I stated to pt at that visit we could get him some samples of Symbicort. Pt verbalized understanding. Nothing further needed.

## 2019-07-27 ENCOUNTER — Encounter: Payer: Self-pay | Admitting: Emergency Medicine

## 2019-07-27 ENCOUNTER — Ambulatory Visit (INDEPENDENT_AMBULATORY_CARE_PROVIDER_SITE_OTHER): Payer: Medicaid Other | Admitting: Emergency Medicine

## 2019-07-27 ENCOUNTER — Other Ambulatory Visit: Payer: Self-pay

## 2019-07-27 DIAGNOSIS — J4520 Mild intermittent asthma, uncomplicated: Secondary | ICD-10-CM

## 2019-07-27 MED ORDER — BUDESONIDE-FORMOTEROL FUMARATE 160-4.5 MCG/ACT IN AERO: 2.0000 | INHALATION_SPRAY | Freq: Two times a day (BID) | RESPIRATORY_TRACT | 0 refills | Status: DC

## 2019-07-27 NOTE — Patient Instructions (Signed)
Please continue Symbicort 2 puffs twice daily.  Rinse and gargle after using. Keep your albuterol available use 2 puffs if needed for shortness of breath, chest tightness, wheezing. Get the flu shot in the fall. Follow with Dr Lamonte Sakai in 3 months or sooner if you have any problems.

## 2019-07-27 NOTE — Assessment & Plan Note (Signed)
Currently back to baseline.  He had a flare at the end of May/beginning of June that actually turned out to be due to COVID-19.  He is using Symbicort 2 puffs once daily in order to conserve it, has trouble affording and has been counting on samples.  We have tried to provide these when we are able.  Very rare albuterol use.

## 2019-07-27 NOTE — Progress Notes (Signed)
   Subjective:    Patient ID: Walter Harmon, male    DOB: 03-May-1966, 53  y.o.   MRN: [id number]  HPI  ROV 07/27/2019 --follow-up visit for 53 year old gentleman, never smoker with a history of asthma, allergic rhinitis, diabetes, chronic kidney disease.  He returns today reporting.  He has been managed on Symbicort tells me that he is currently taking 2 puffs once daily to preserve medicine due to cost.  He has albuterol which he uses very rarely.  He was treated for an acute flare in late May with azithromycin and prednisone, then 2 days later was admitted with an arrhythmia, COVID positive. He self-isolated x 14 days. He now feels back to baseline.    Review of Systems As per HPI     Objective:   Physical Exam Vitals:   07/27/19 1614  BP: 122/86  Pulse: 85  SpO2: 98%  Weight: 158 lb (71.7 kg)  Height: 5\' 5"  (1.651 m)   Gen: Pleasant, well-nourished, in no distress,  normal affect  ENT: No lesions,  mouth clear,  oropharynx clear, no postnasal drip  Neck: No JVD, no stridor  Lungs: No use of accessory muscles, no crackles or wheezing on normal respiration, no wheeze on forced expiration  Cardiovascular: RRR, heart sounds normal, no murmur or gallops, no peripheral edema  Musculoskeletal: No deformities, no cyanosis or clubbing  Neuro: alert, awake, non focal  Skin: Warm, no lesions or rash     Assessment & Plan:  Asthma, chronic Currently back to baseline.  He had a flare at the end of May/beginning of June that actually turned out to be due to COVID-19.  He is using Symbicort 2 puffs once daily in order to conserve it, has trouble affording and has been counting on samples.  We have tried to provide these when we are able.  Very rare albuterol use.  Baltazar Apo, MD, PhD 07/27/2019, 4:57 PM [facility] [phone] or if no answer [phone]

## 2019-12-08 ENCOUNTER — Telehealth: Payer: Self-pay | Admitting: Emergency Medicine

## 2019-12-08 NOTE — Telephone Encounter (Signed)
No samples of symbicort available  Pt aware  Nothing further needed

## 2019-12-31 ENCOUNTER — Ambulatory Visit: Payer: Self-pay | Admitting: Internal Medicine

## 2019-12-31 ENCOUNTER — Encounter: Payer: Self-pay | Admitting: Internal Medicine

## 2019-12-31 ENCOUNTER — Other Ambulatory Visit: Payer: Self-pay

## 2019-12-31 VITALS — BP 122/84 | HR 72 | Resp 12 | Ht 63.25 in | Wt 164.0 lb

## 2019-12-31 DIAGNOSIS — I10 Essential (primary) hypertension: Secondary | ICD-10-CM

## 2019-12-31 DIAGNOSIS — Z23 Encounter for immunization: Secondary | ICD-10-CM

## 2019-12-31 DIAGNOSIS — J45909 Unspecified asthma, uncomplicated: Secondary | ICD-10-CM

## 2019-12-31 DIAGNOSIS — E1165 Type 2 diabetes mellitus with hyperglycemia: Secondary | ICD-10-CM

## 2019-12-31 DIAGNOSIS — E782 Mixed hyperlipidemia: Secondary | ICD-10-CM

## 2019-12-31 DIAGNOSIS — N50811 Right testicular pain: Secondary | ICD-10-CM

## 2019-12-31 DIAGNOSIS — N189 Chronic kidney disease, unspecified: Secondary | ICD-10-CM

## 2019-12-31 LAB — POCT URINALYSIS DIPSTICK
Bilirubin, UA: NEGATIVE
Glucose, UA: POSITIVE — AB
Ketones, UA: NEGATIVE
Nitrite, UA: NEGATIVE
Protein, UA: POSITIVE — AB
Spec Grav, UA: 1.02 (ref 1.010–1.025)
Urobilinogen, UA: 1 E.U./dL
pH, UA: 6 (ref 5.0–8.0)

## 2019-12-31 MED ORDER — METOCLOPRAMIDE HCL 5 MG PO TABS: 10.0000 mg | ORAL_TABLET | Freq: Three times a day (TID) | ORAL | 11 refills | Status: DC

## 2019-12-31 MED ORDER — AMLODIPINE BESYLATE 5 MG PO TABS: 5.0000 mg | ORAL_TABLET | Freq: Every day | ORAL | 3 refills | Status: DC

## 2019-12-31 MED ORDER — HYDRALAZINE HCL 25 MG PO TABS: 25.0000 mg | ORAL_TABLET | Freq: Two times a day (BID) | ORAL | 11 refills | Status: DC

## 2019-12-31 MED ORDER — PROAIR HFA 108 (90 BASE) MCG/ACT IN AERS: INHALATION_SPRAY | RESPIRATORY_TRACT | 2 refills | Status: DC

## 2019-12-31 MED ORDER — ATORVASTATIN CALCIUM 40 MG PO TABS: 40.0000 mg | ORAL_TABLET | Freq: Every day | ORAL | 11 refills | Status: DC

## 2019-12-31 MED ORDER — PANTOPRAZOLE SODIUM 40 MG PO TBEC: 40.0000 mg | DELAYED_RELEASE_TABLET | Freq: Every day | ORAL | 11 refills | Status: DC

## 2019-12-31 MED ORDER — LANTUS SOLOSTAR 100 UNIT/ML ~~LOC~~ SOPN: 20.0000 [IU] | PEN_INJECTOR | Freq: Every day | SUBCUTANEOUS | 11 refills | Status: DC

## 2019-12-31 MED ORDER — NOVOLOG FLEXPEN 100 UNIT/ML ~~LOC~~ SOPN: 0.0000 [IU] | PEN_INJECTOR | Freq: Three times a day (TID) | SUBCUTANEOUS | 11 refills | Status: DC

## 2019-12-31 MED ORDER — LEVOFLOXACIN 500 MG PO TABS: 500.0000 mg | ORAL_TABLET | Freq: Every day | ORAL | 0 refills | Status: DC

## 2019-12-31 MED ORDER — BUDESONIDE-FORMOTEROL FUMARATE 160-4.5 MCG/ACT IN AERO: INHALATION_SPRAY | RESPIRATORY_TRACT | 11 refills | Status: DC

## 2019-12-31 MED ORDER — ALLOPURINOL 100 MG PO TABS: 100.0000 mg | ORAL_TABLET | Freq: Every day | ORAL | 11 refills | Status: DC

## 2019-12-31 NOTE — Progress Notes (Signed)
Subjective:    Patient ID: Walter Harmon, male   DOB: 11-26-1966, 54 y.o.   MRN: [id number]   HPI   Here to establish  1.  Right groin/testicular pain:  History of right inguinal hernia repair when age 57 yo. Last year, started having pain in groin and down into testicle intermittently. July of 2020, noted having more frequent discomfort. Has swelling now when has discomfort--sounds like he can press on the area and the swelling will go down.  He states he does not feel anything is moving down into the scrotum as he had with the hernia before.  He at times will try to urinate with the pain and he states cannot urinate until he puts pressure over the right scrotum to allow urine to come out. No fever, no abdominal or flank pain.  Patient went to ED 04/29/2019 for what he feels was the same issue and also mentioned gross hematuria at times, though denies having any since.   CT scan did not show renal or collecting system stone disease.  It did show small kidneys consistent with CKD with DM/hypertension. Ultrasound of testicles showed bilateral hydroceles and left epididymal swelling.   He was treated with Levaquin for probable UTI.  He feels he did get better for a while. His urine culture, however, was negative for bacterial UTI.    He has not been sexually active since 09/2017, which is just before his girlfriend died in her sleep of a seizure.  2.  DM:  A1C done 05/24/2019 was elevated at 10.0.  Did not follow up with Dr. Chapman Fitch in [facility] he did not know he was to follow up.  3.  Asthma:  Followed up with [facility], but has not continued.   4.  CKD:  Has appt on the 12th with [facility].  He does have the orange card.      Current Meds  Medication Sig  . allopurinol (ZYLOPRIM) 100 MG tablet Take 1 tablet (100 mg total) by mouth daily.  Marland Kitchen amLODipine (NORVASC) 5 MG tablet Take 1 tablet (5 mg total) by mouth daily.  Marland Kitchen aspirin 81 MG EC tablet Take 1  tablet (81 mg total) by mouth daily.  Marland Kitchen atorvastatin (LIPITOR) 40 MG tablet Take 1 tablet (40 mg total) by mouth daily at 6 PM.  . bisacodyl (DULCOLAX) 5 MG EC tablet Take 1 tablet (5 mg total) by mouth daily as needed for moderate constipation.  . blood glucose meter kit and supplies KIT Dispense glucometer and testing supplies based on patient and insurance preference. Use up to four times daily as directed. (FOR ICD-9 250.00, 250.01).  . budesonide-formoterol (SYMBICORT) 160-4.5 MCG/ACT inhaler INHALE TWO PUFFS INTO THE LUNGS DAILY (Patient taking differently: Inhale 2 puffs into the lungs daily. INHALE TWO PUFFS INTO THE LUNGS DAILY)  . hydrALAZINE (APRESOLINE) 25 MG tablet Take 1 tablet (25 mg total) by mouth 2 (two) times daily.  . insulin aspart (NOVOLOG FLEXPEN) 100 UNIT/ML FlexPen Inject 0-15 Units into the skin 3 (three) times daily with meals.  . Insulin Glargine (LANTUS SOLOSTAR) 100 UNIT/ML Solostar Pen Inject 20 Units into the skin daily.  . Insulin Pen Needle (PEN NEEDLES) 30G X 8 MM MISC 1 Dose by Does not apply route as directed.  . metoCLOPramide (REGLAN) 5 MG tablet Take 2 tablets (10 mg total) by mouth 3 (three) times daily before meals. Prn hiccups/nausea  . Multiple Vitamin (MULTIVITAMIN WITH MINERALS) TABS tablet Take 1 tablet by  mouth daily.  . nitroGLYCERIN (NITROSTAT) 0.4 MG SL tablet Place 1 tablet (0.4 mg total) under the tongue every 5 (five) minutes x 3 doses as needed for chest pain.  . pantoprazole (PROTONIX) 40 MG tablet Take 1 tablet (40 mg total) by mouth daily at 12 noon.  Marland Kitchen PROAIR HFA 108 (90 Base) MCG/ACT inhaler INHALE TWO PUFFS BY MOUTH EVERY 6 HOURS AS NEEDED FOR WHEEZING   No Known Allergies   Review of Systems    Objective:   BP 122/84 (BP Location: Left Arm, Patient Position: Sitting, Cuff Size: Normal)   Pulse 72   Resp 12   Ht 5' 3.25" (1.607 m)   Wt 164 lb (74.4 kg)   BMI 28.82 kg/m   Physical Exam  NAD Lungs:  CTA CV:  RRR with normal  S1 and S2, No S3, S4 or murmur.  Radial pulses normal and equal Abd:  S, NT, No HSM or mass, + BS.  No flank tenderness. GU:  Area of loss of pigment over right lateral scrotum.  No inguinal hernia noted.  Left testicle soft and small.  No mass.  Right testicle--more prominent tubular structure extending down into scrotum, which is nontender.  No definite mass.  Tender over nodular structure which reproduces the pain at superior pole of right testicle.  No other genital lesion noted.  The nodular structure is what he states is the the swelling he discussed in history.   Assessment & Plan   1.  Possible right recurrent epididymitis:  Levaquin 500 mg daily for 10 days.  Send urine for culture.  UA not particularly supportive of UTI--more so with CKD.  2.  DM/hypertension/hyperlipidemia/asthma:  To return next week for fasting labs.  Refilled chronic meds with [facility] as now with orange card.  Discussed MAP at [facility] for some meds.  3.  HM:  Tdap today

## 2020-01-02 LAB — URINE CULTURE: Organism ID, Bacteria: NO GROWTH

## 2020-01-03 ENCOUNTER — Telehealth: Payer: Self-pay | Admitting: Emergency Medicine

## 2020-01-03 MED ORDER — PROAIR HFA 108 (90 BASE) MCG/ACT IN AERS: INHALATION_SPRAY | RESPIRATORY_TRACT | 2 refills | Status: DC

## 2020-01-03 NOTE — Telephone Encounter (Signed)
I called and spoke with patient and advised him that we no longer carry Symbicort samples and have never had any Pro-air. I asked if he would like these sent into the pharmacy and he states only the Pro-air. I have sent Pro-air to the pharmacy.

## 2020-01-04 ENCOUNTER — Ambulatory Visit: Payer: Self-pay | Admitting: Internal Medicine

## 2020-01-06 ENCOUNTER — Other Ambulatory Visit (INDEPENDENT_AMBULATORY_CARE_PROVIDER_SITE_OTHER): Payer: Self-pay

## 2020-01-06 ENCOUNTER — Telehealth: Payer: Self-pay | Admitting: Internal Medicine

## 2020-01-06 DIAGNOSIS — Z79899 Other long term (current) drug therapy: Secondary | ICD-10-CM

## 2020-01-06 DIAGNOSIS — Z13220 Encounter for screening for lipoid disorders: Secondary | ICD-10-CM

## 2020-01-06 DIAGNOSIS — E1165 Type 2 diabetes mellitus with hyperglycemia: Secondary | ICD-10-CM

## 2020-01-06 NOTE — Telephone Encounter (Signed)
Dawn at [facility] called requesting a call back to get clarification on pt's medication budesonide-formoterol (SYMBICORT) 160-4.5 MCG/ACT inhaler. Dawn stated they received for instructions 2 puff into the lungs daily and the way the dispense it is 2 puff twice daily.  Dawn stated pt. Is at the pharmacy now and will like a call back from [place] to gets clearance on what to do.  Please advise.

## 2020-01-06 NOTE — Telephone Encounter (Signed)
Spoke with Dawn and Dr. Amil Amen and yes he should use it  twice a day.

## 2020-01-07 ENCOUNTER — Other Ambulatory Visit: Payer: Self-pay

## 2020-01-07 LAB — CBC WITH DIFFERENTIAL/PLATELET
Basophils Absolute: 0 10*3/uL (ref 0.0–0.2)
Basos: 1 %
EOS (ABSOLUTE): 0.3 10*3/uL (ref 0.0–0.4)
Eos: 6 %
Hematocrit: 44.9 % (ref 37.5–51.0)
Hemoglobin: 15.6 g/dL (ref 13.0–17.7)
Immature Grans (Abs): 0 10*3/uL (ref 0.0–0.1)
Immature Granulocytes: 0 %
Lymphocytes Absolute: 1.4 10*3/uL (ref 0.7–3.1)
Lymphs: 25 %
MCH: 31.5 pg (ref 26.6–33.0)
MCHC: 34.7 g/dL (ref 31.5–35.7)
MCV: 91 fL (ref 79–97)
Monocytes Absolute: 0.4 10*3/uL (ref 0.1–0.9)
Monocytes: 7 %
Neutrophils Absolute: 3.5 10*3/uL (ref 1.4–7.0)
Neutrophils: 61 %
Platelets: 229 10*3/uL (ref 150–450)
RBC: 4.95 x10E6/uL (ref 4.14–5.80)
RDW: 12.5 % (ref 11.6–15.4)
WBC: 5.7 10*3/uL (ref 3.4–10.8)

## 2020-01-07 LAB — LIPID PANEL W/O CHOL/HDL RATIO
Cholesterol, Total: 227 mg/dL — ABNORMAL HIGH (ref 100–199)
HDL: 35 mg/dL — ABNORMAL LOW (ref >39)
LDL Chol Calc (NIH): 162 mg/dL — ABNORMAL HIGH (ref 0–99)
Triglycerides: 161 mg/dL — ABNORMAL HIGH (ref 0–149)
VLDL Cholesterol Cal: 30 mg/dL (ref 5–40)

## 2020-01-07 LAB — COMPREHENSIVE METABOLIC PANEL
ALT: 9 IU/L (ref 0–44)
AST: 6 IU/L (ref 0–40)
Albumin/Globulin Ratio: 1.5 (ref 1.2–2.2)
Albumin: 4.3 g/dL (ref 3.8–4.9)
Alkaline Phosphatase: 66 IU/L (ref 39–117)
BUN/Creatinine Ratio: 8 — ABNORMAL LOW (ref 9–20)
BUN: 22 mg/dL (ref 6–24)
Bilirubin Total: 0.5 mg/dL (ref 0.0–1.2)
CO2: 23 mmol/L (ref 20–29)
Calcium: 9.8 mg/dL (ref 8.7–10.2)
Chloride: 102 mmol/L (ref 96–106)
Creatinine, Ser: 2.6 mg/dL — ABNORMAL HIGH (ref 0.76–1.27)
GFR calc Af Amer: 31 mL/min/{1.73_m2} — ABNORMAL LOW (ref >59)
GFR calc non Af Amer: 27 mL/min/{1.73_m2} — ABNORMAL LOW (ref >59)
Globulin, Total: 2.9 g/dL (ref 1.5–4.5)
Glucose: 187 mg/dL — ABNORMAL HIGH (ref 65–99)
Potassium: 5.3 mmol/L — ABNORMAL HIGH (ref 3.5–5.2)
Sodium: 138 mmol/L (ref 134–144)
Total Protein: 7.2 g/dL (ref 6.0–8.5)

## 2020-01-07 LAB — HGB A1C W/O EAG: Hgb A1c MFr Bld: 9 % — ABNORMAL HIGH (ref 4.8–5.6)

## 2020-01-11 ENCOUNTER — Telehealth: Payer: Self-pay | Admitting: Internal Medicine

## 2020-01-11 NOTE — Telephone Encounter (Signed)
Spoke with patient per Dr. Amil Amen have patient come in and leave UA. Patient verbalized understanding and scheduled for tomorrow in the am

## 2020-01-11 NOTE — Telephone Encounter (Signed)
Patient called stating has been having pain on the right groin area since Sunday in the afternoon; patient states this pain is causing too much discomfort even though he has been taking Levofloxacin 500 mg; pt. Also stated is taking ibuprofen 200 mg 4 pills twice a day but is not helping that much.  Please advise.

## 2020-01-12 ENCOUNTER — Other Ambulatory Visit (INDEPENDENT_AMBULATORY_CARE_PROVIDER_SITE_OTHER): Payer: Self-pay

## 2020-01-12 DIAGNOSIS — N50811 Right testicular pain: Secondary | ICD-10-CM

## 2020-01-12 LAB — POCT URINALYSIS DIPSTICK
Bilirubin, UA: NEGATIVE
Glucose, UA: NEGATIVE
Ketones, UA: NEGATIVE
Leukocytes, UA: NEGATIVE
Nitrite, UA: NEGATIVE
Protein, UA: POSITIVE — AB
Spec Grav, UA: 1.02 (ref 1.010–1.025)
Urobilinogen, UA: 0.2 E.U./dL
pH, UA: 5 (ref 5.0–8.0)

## 2020-01-15 LAB — URINE CULTURE: Organism ID, Bacteria: NO GROWTH

## 2020-01-21 ENCOUNTER — Ambulatory Visit (INDEPENDENT_AMBULATORY_CARE_PROVIDER_SITE_OTHER): Payer: Self-pay | Admitting: Internal Medicine

## 2020-01-21 ENCOUNTER — Encounter: Payer: Self-pay | Admitting: Internal Medicine

## 2020-01-21 ENCOUNTER — Other Ambulatory Visit: Payer: Self-pay

## 2020-01-21 VITALS — BP 170/110 | HR 82 | Resp 12 | Ht 63.25 in | Wt 165.0 lb

## 2020-01-21 DIAGNOSIS — N50811 Right testicular pain: Secondary | ICD-10-CM

## 2020-01-21 DIAGNOSIS — E1165 Type 2 diabetes mellitus with hyperglycemia: Secondary | ICD-10-CM

## 2020-01-21 DIAGNOSIS — E782 Mixed hyperlipidemia: Secondary | ICD-10-CM

## 2020-01-21 DIAGNOSIS — N183 Chronic kidney disease, stage 3 unspecified: Secondary | ICD-10-CM

## 2020-01-21 DIAGNOSIS — I10 Essential (primary) hypertension: Secondary | ICD-10-CM

## 2020-01-21 DIAGNOSIS — J4520 Mild intermittent asthma, uncomplicated: Secondary | ICD-10-CM

## 2020-01-21 LAB — GLUCOSE, POCT (MANUAL RESULT ENTRY): POC Glucose: 195 mg/dl — AB (ref 70–99)

## 2020-01-21 MED ORDER — AMLODIPINE BESYLATE 5 MG PO TABS: 5.0000 mg | ORAL_TABLET | Freq: Every day | ORAL | 11 refills | Status: DC

## 2020-01-21 MED ORDER — ASPIRIN 81 MG PO TBEC: 81.0000 mg | DELAYED_RELEASE_TABLET | Freq: Every day | ORAL | Status: DC

## 2020-01-21 MED ORDER — ALLOPURINOL 100 MG PO TABS: 100.0000 mg | ORAL_TABLET | Freq: Every day | ORAL | 11 refills | Status: DC

## 2020-01-21 MED ORDER — HYDRALAZINE HCL 25 MG PO TABS: 25.0000 mg | ORAL_TABLET | Freq: Two times a day (BID) | ORAL | 11 refills | Status: DC

## 2020-01-21 MED ORDER — ATORVASTATIN CALCIUM 40 MG PO TABS: 40.0000 mg | ORAL_TABLET | Freq: Every day | ORAL | 11 refills | Status: DC

## 2020-01-21 NOTE — Patient Instructions (Signed)
Check your blood sugars before each dose of Novolog so you know how much to give--10 minutes before eating.

## 2020-01-21 NOTE — Progress Notes (Signed)
Subjective:    Patient ID: Walter Harmon, male   DOB: 1966-11-26, 54 y.o.   MRN: [id number]   HPI   See acute visit when established care and complaint of right groin/testicular pain--12/31/2019  General:  Lost to care from 2017 or 2018 until hospitalized 05/24/2019.  Went without any treatment during that time period save for asthma care.  1.  Recurrent right groin pain:  Urine culture from last visit was negative.  States even when on Levaquin after last visit, he continued to have the pain on and off. Previously, would have pain in right groin associated with swelling and would push in the swelling to be able to urinate.   History of inguinal hernia repair in the area many years ago.    2.  CKD/hypertension:  Had an appt on Jan 12th, but got busy and forgot.   He has only been to [place] twice--2020 was the first.  Sounds like he was started on Losartan.   He cannot say who his nephrologist is.   He is reportedly supposed to be taking 2 bp meds.  Patient is a very poor historian--not clear when he last took any meds.  Last visit, stated he was taking most of his currently listed meds.   Sent for records from Nephrology and he is to be taking Hydralazine, Amlodipine, and 25 mg of Losartan daily.  He is only taking the latter and his K+ was 5.3% with labs on 01/06/2020 He has a diagnosis of biopsy (2014) proven FSGS and Diabetic glomerulonephropathy. Treated with cyclosporine by [name], but Harmon was always undetectable (patient states he felt it caused a cough and refused to take, though was felt to have ACE I cough) and so med was discontinued.  He is not certain what meds he does and does not have at home.  He would like to check before I send a new Rx.   3.  DM, Type 2, insulin requiring:  Checking sugars but only once in the morning.   States he gives a dose of Novolog up to 3 times daily before meals based on sugar Harmon, but again, only checking once  daily. A1C last check 01/06/2020:  9.0%, improved from 10.  4.  Hyperlipidemia:  Not at goal. Lipid Panel     Component Value Date/Time   CHOL 227 (H) 01/06/2020 0901   TRIG 161 (H) 01/06/2020 0901   HDL 35 (L) 01/06/2020 0901   CHOLHDL 9.2 05/24/2019 0401   VLDL 38 05/24/2019 0401   LDLCALC 162 (H) 01/06/2020 0901   LABVLDL 30 01/06/2020 0901      Current Meds  Medication Sig  . budesonide-formoterol (SYMBICORT) 160-4.5 MCG/ACT inhaler INHALE TWO PUFFS INTO THE LUNGS DAILY  . insulin aspart (NOVOLOG FLEXPEN) 100 UNIT/ML FlexPen Inject 0-15 Units into the skin 3 (three) times daily with meals.  . Insulin Glargine (LANTUS SOLOSTAR) 100 UNIT/ML Solostar Pen Inject 20 Units into the skin daily.  . Insulin Pen Needle (PEN NEEDLES) 30G X 8 MM MISC 1 Dose by Does not apply route as directed.  Marland Kitchen PROAIR HFA 108 (90 Base) MCG/ACT inhaler INHALE TWO PUFFS BY MOUTH EVERY 6 HOURS AS NEEDED FOR WHEEZING        No Known Allergies   Review of Systems    Objective:   BP (!) 170/110 (BP Location: Left Arm, Patient Position: Sitting, Cuff Size: Normal)   Pulse 82   Resp 12   Ht 5' 3.25" (  1.607 m)   Wt 165 lb (74.8 kg)   BMI 29.00 kg/m   Physical Exam  NAD HEENT:  PERRL, EOMI, TMs pearly gray Neck:  Supple, No adenopathy, no thyromegaly Chest:  CTA CV:  RRR without murmur or rub.  Radial and DP pulses normal and equal Abd:  S, NT, No HSM or mass, + BS GU:  No palpable inguinal hernia.  I do not palpate a thickened epididymis on right testicle as did on the 8th.  Mild tenderness over right epididymis, testicles descended and do not palpate mass, NT.  Penis without lesion.   Assessment & Plan   1.  Intermittent right groin pain with difficulty urinating and swelling with history of right inguinal hernia repair years ago.  Unable to find obvious cause of symptoms today.   Microscopic hematuria felt secondary to CKD.  CT for renal stone study in May 2020 negative for  stones. Ultrasound of testicles also in May 2020 showed mild enlargement of left epididymis, bilateral small hydroceles, small right varicocele.   He has had 2 rounds of Levaquin, the latter for what was felt to be possibly right epididymitis in this office, but continues with right sided symptoms. Urology referral.  2.  DM:  Encouraged patient to check his sugars before each meal so he knows what dose of short acting insulin to give.  3.  CKD:  He is to reappoint for Nephrology  4.  Hypertension:  Amlodipine and hydralazine ordered.  He will call back with meds from home.  Follow up in 1 month.  5.  Hyperlipidemia:  Atorvastatin  6.  Hx of elevated uric acid:  Allopurinol.

## 2020-01-27 ENCOUNTER — Ambulatory Visit: Payer: Self-pay | Admitting: Internal Medicine

## 2020-02-07 ENCOUNTER — Other Ambulatory Visit: Payer: Self-pay

## 2020-02-07 MED ORDER — AMLODIPINE-ATORVASTATIN 5-40 MG PO TABS: 1.0000 | ORAL_TABLET | Freq: Every day | ORAL | 11 refills | Status: DC

## 2020-02-28 ENCOUNTER — Ambulatory Visit: Payer: Self-pay | Admitting: Internal Medicine

## 2020-02-29 ENCOUNTER — Ambulatory Visit: Payer: Self-pay | Admitting: Internal Medicine

## 2020-03-22 ENCOUNTER — Ambulatory Visit (INDEPENDENT_AMBULATORY_CARE_PROVIDER_SITE_OTHER): Payer: Self-pay | Admitting: Internal Medicine

## 2020-03-22 ENCOUNTER — Encounter: Payer: Self-pay | Admitting: Internal Medicine

## 2020-03-22 ENCOUNTER — Other Ambulatory Visit: Payer: Self-pay

## 2020-03-22 VITALS — BP 138/88 | HR 80 | Resp 12 | Ht 63.25 in | Wt 166.0 lb

## 2020-03-22 DIAGNOSIS — E1165 Type 2 diabetes mellitus with hyperglycemia: Secondary | ICD-10-CM

## 2020-03-22 DIAGNOSIS — J301 Allergic rhinitis due to pollen: Secondary | ICD-10-CM

## 2020-03-22 DIAGNOSIS — N183 Chronic kidney disease, stage 3 unspecified: Secondary | ICD-10-CM

## 2020-03-22 DIAGNOSIS — I10 Essential (primary) hypertension: Secondary | ICD-10-CM

## 2020-03-22 DIAGNOSIS — J4520 Mild intermittent asthma, uncomplicated: Secondary | ICD-10-CM

## 2020-03-22 LAB — GLUCOSE, POCT (MANUAL RESULT ENTRY): POC Glucose: 309 mg/dl — AB (ref 70–99)

## 2020-03-22 NOTE — Patient Instructions (Addendum)
Filling medication:  Fill pill box on Sunday every week When you finish one of your pill bottles for the month after completing a week in your pill box, call into pharmacy to refill all meds and make a reminder on your phone to pick up the pills/meds 48 hours before you will be out of the med.  Eat at 7:30 a.m. every morning  1 p.m. for lunch  8 p.m. for dinner.  Give at least 1 unit of Novolog to cover the carbs in the meal, even if your sugar is 120 Give yourself your sliding level of Novolog if your sugar is high plus the 1 unit for the meal. Give the Novolog 10 minutes before each meal.  You need to check your sugar before each meal to know how much insulin to give.  Lantus--give in the morning before you eat breakfast.  When you find the paper from [facility] with bloodwork needed, call us and we can set it up;

## 2020-03-22 NOTE — Progress Notes (Signed)
    Subjective:    Patient ID: Walter Harmon, male   DOB: [birth year], 54 y.o.   MRN: [id number]   HPI   1.  Hypertension:  Has not been taking Amlodipine for at least a month.  Maybe taking Hydralazine.   2.  Right groin discomfort and urinary hesitancy, need to press on right groin to urinate, microhematuria.  He saw Urology on March 18th and sounds like plan for cystoscopy.    3.  DM:  A1C 01/06/2020 with result of 9.0%.  Checking sugar only once in the morning.  Lowest has been 150.  Other times in 300s.  Sounds like he does not miss the Lantus, but does miss Novolog.     7 am breakfast  Often does not eat lunch, but discussed the possibility of 1 p.m.   8 p.m.:  Dinner  4.  Asthma and allergies:  Using Symbicort regularly.  Using Proair twice weekly.    Current Meds  Medication Sig  . budesonide-formoterol (SYMBICORT) 160-4.5 MCG/ACT inhaler INHALE TWO PUFFS INTO THE LUNGS DAILY  . insulin aspart (NOVOLOG FLEXPEN) 100 UNIT/ML FlexPen Inject 0-15 Units into the skin 3 (three) times daily with meals.  . Insulin Glargine (LANTUS SOLOSTAR) 100 UNIT/ML Solostar Pen Inject 20 Units into the skin daily.  . Insulin Pen Needle (PEN NEEDLES) 30G X 8 MM MISC 1 Dose by Does not apply route as directed.  . Multiple Vitamin (MULTIVITAMIN WITH MINERALS) TABS tablet Take 1 tablet by mouth daily.  Marland Kitchen PROAIR HFA 108 (90 Base) MCG/ACT inhaler INHALE TWO PUFFS BY MOUTH EVERY 6 HOURS AS NEEDED FOR WHEEZING   No Known Allergies   Review of Systems    Objective:   BP 138/88 (BP Location: Left Arm, Patient Position: Sitting, Cuff Size: Normal)   Pulse 80   Resp 12   Ht 5' 3.25" (1.607 m)   Wt 166 lb (75.3 kg)   PF 310 L/min   BMI 29.17 kg/m   Physical Exam  NAD HEENT:  PERRL, EOMI Neck:  Supple, No adenopathy Chest:  CTA CV:  RRR without murmur or rub.  Radial and DP pulses normal and equal. Abd:  S, NT, No HSM or mass, + BS LE:  No edema   Assessment & Plan   1.   DM/CKD/Difficulty following medical advice/treatment:  Discussed eating regularly scheduled meals and making sure he gives himself at least 1-2 units of Novolog 10 minutes before each meal.  Continue Lantus SW intern to call and make sure he has set up pill box with instructions in patient instructions given. Confirm he is following up with Nephrology  2.  Hypertension:  BP okay today despite likely frequently missing meds.  Goal in 120/70 range.   Pillbox as above.  3.  GU issues:  As per Urology.  He apparently was to have bloodwork done for [facility]--he will call with those labs needed.    4.  Allergies/Asthma:  Needs to get meds from [facility] at [facility].  Rx sent in January.  Instructions on how to apply for MAP given again.  Fasting labs in 3 months followed by OV

## 2020-06-22 ENCOUNTER — Other Ambulatory Visit (INDEPENDENT_AMBULATORY_CARE_PROVIDER_SITE_OTHER): Payer: Self-pay

## 2020-06-22 DIAGNOSIS — E1165 Type 2 diabetes mellitus with hyperglycemia: Secondary | ICD-10-CM

## 2020-06-22 DIAGNOSIS — E782 Mixed hyperlipidemia: Secondary | ICD-10-CM

## 2020-06-22 DIAGNOSIS — Z79899 Other long term (current) drug therapy: Secondary | ICD-10-CM

## 2020-06-23 ENCOUNTER — Other Ambulatory Visit: Payer: Medicaid Other

## 2020-06-23 LAB — COMPREHENSIVE METABOLIC PANEL
ALT: 17 IU/L (ref 0–44)
AST: 18 IU/L (ref 0–40)
Albumin/Globulin Ratio: 1.3 (ref 1.2–2.2)
Albumin: 4.2 g/dL (ref 3.8–4.9)
Alkaline Phosphatase: 88 IU/L (ref 48–121)
BUN/Creatinine Ratio: 8 — ABNORMAL LOW (ref 9–20)
BUN: 22 mg/dL (ref 6–24)
Bilirubin Total: 0.4 mg/dL (ref 0.0–1.2)
CO2: 22 mmol/L (ref 20–29)
Calcium: 9.6 mg/dL (ref 8.7–10.2)
Chloride: 104 mmol/L (ref 96–106)
Creatinine, Ser: 2.87 mg/dL — ABNORMAL HIGH (ref 0.76–1.27)
GFR calc Af Amer: 28 mL/min/{1.73_m2} — ABNORMAL LOW (ref >59)
GFR calc non Af Amer: 24 mL/min/{1.73_m2} — ABNORMAL LOW (ref >59)
Globulin, Total: 3.2 g/dL (ref 1.5–4.5)
Glucose: 89 mg/dL (ref 65–99)
Potassium: 4.6 mmol/L (ref 3.5–5.2)
Sodium: 140 mmol/L (ref 134–144)
Total Protein: 7.4 g/dL (ref 6.0–8.5)

## 2020-06-23 LAB — LIPID PANEL W/O CHOL/HDL RATIO
Cholesterol, Total: 109 mg/dL (ref 100–199)
HDL: 33 mg/dL — ABNORMAL LOW (ref >39)
LDL Chol Calc (NIH): 61 mg/dL (ref 0–99)
Triglycerides: 74 mg/dL (ref 0–149)
VLDL Cholesterol Cal: 15 mg/dL (ref 5–40)

## 2020-06-23 LAB — HGB A1C W/O EAG: Hgb A1c MFr Bld: 8.5 % — ABNORMAL HIGH (ref 4.8–5.6)

## 2020-06-28 ENCOUNTER — Ambulatory Visit (INDEPENDENT_AMBULATORY_CARE_PROVIDER_SITE_OTHER): Payer: Self-pay | Admitting: Internal Medicine

## 2020-06-28 ENCOUNTER — Other Ambulatory Visit: Payer: Self-pay

## 2020-06-28 ENCOUNTER — Encounter: Payer: Self-pay | Admitting: Internal Medicine

## 2020-06-28 VITALS — BP 136/88 | HR 96 | Resp 12 | Ht 63.25 in | Wt 172.0 lb

## 2020-06-28 DIAGNOSIS — I10 Essential (primary) hypertension: Secondary | ICD-10-CM

## 2020-06-28 DIAGNOSIS — N183 Chronic kidney disease, stage 3 unspecified: Secondary | ICD-10-CM

## 2020-06-28 DIAGNOSIS — J301 Allergic rhinitis due to pollen: Secondary | ICD-10-CM

## 2020-06-28 DIAGNOSIS — E1165 Type 2 diabetes mellitus with hyperglycemia: Secondary | ICD-10-CM

## 2020-06-28 DIAGNOSIS — E782 Mixed hyperlipidemia: Secondary | ICD-10-CM

## 2020-06-28 MED ORDER — CETIRIZINE HCL 10 MG PO TABS: 10.0000 mg | ORAL_TABLET | Freq: Every day | ORAL | 11 refills | Status: DC

## 2020-06-28 MED ORDER — LANTUS SOLOSTAR 100 UNIT/ML ~~LOC~~ SOPN: PEN_INJECTOR | SUBCUTANEOUS | 11 refills | Status: DC

## 2020-06-28 NOTE — Progress Notes (Signed)
Subjective:    Patient ID: Walter Harmon, male   DOB: 1966-05-19, 54 y.o.   MRN: [id number]   HPI   1.  DM:  Lantus 20 units daily.  Using the Novolog generally 2-3 times daily as does not take if his blood glucose is less than 120.  A1C down to 8.5% Describes eating high carb diet, though some meals are good.  Often eating pasta and then another starch.     2.  Hypertension:  Missing Hydralazine.   3.  CKD:  Went to [facility]--not sure who his nephrologist is.  States he was told he is stable.  Creatinine recently 2.87.  Discussed need to tightly control DM and HTN to prevent more rapid progression of kidney damage.  4.  History of elevated Uric Acid, but no gouty attack in past.  He is not taking Allopurinol at this time.    5.  Hyperlipidemia:  At goal with everything besides HDL.   Lipid Panel     Component Value Date/Time   CHOL 109 06/22/2020 0854   TRIG 74 06/22/2020 0854   HDL 33 (L) 06/22/2020 0854   CHOLHDL 9.2 05/24/2019 0401   VLDL 38 05/24/2019 0401   LDLCALC 61 06/22/2020 0854   LABVLDL 15 06/22/2020 0854   6.  Asthma and allergies:  Taking symbicort daily.  Using rescue inhaler once or less than once weekly.  He has responded to Zyrtec in past.  Currently using Benadryl.    7.  Urology:  States had cystoscopy done in early June with Dr. Gilford Rile, they were unable to get very far as had lots of scarring.  Sounds like planning some sort of radiologic procedure to fully evaluate.  States he needs bloodwork done again, but does not know what it is supposed to be.  Current Meds  Medication Sig  . amLODipine-atorvastatin (CADUET) 5-40 MG tablet Take 1 tablet by mouth daily.  Marland Kitchen aspirin 81 MG EC tablet Take 1 tablet (81 mg total) by mouth daily.  . blood glucose meter kit and supplies KIT Dispense glucometer and testing supplies based on patient and insurance preference. Use up to four times daily as directed. (FOR ICD-9 250.00, 250.01).  .  budesonide-formoterol (SYMBICORT) 160-4.5 MCG/ACT inhaler INHALE TWO PUFFS INTO THE LUNGS DAILY  . insulin aspart (NOVOLOG FLEXPEN) 100 UNIT/ML FlexPen Inject 0-15 Units into the skin 3 (three) times daily with meals.  . Insulin Glargine (LANTUS SOLOSTAR) 100 UNIT/ML Solostar Pen Inject 20 Units into the skin daily.  . Insulin Pen Needle (PEN NEEDLES) 30G X 8 MM MISC 1 Dose by Does not apply route as directed.  . Methylsulfonylmethane (MSM PO) Take by mouth. 2 daily  . Multiple Vitamin (MULTIVITAMIN WITH MINERALS) TABS tablet Take 1 tablet by mouth daily.  Marland Kitchen PROAIR HFA 108 (90 Base) MCG/ACT inhaler INHALE TWO PUFFS BY MOUTH EVERY 6 HOURS AS NEEDED FOR WHEEZING   No Known Allergies   Review of Systems    Objective:   BP 136/88 (BP Location: Left Arm, Patient Position: Sitting, Cuff Size: Normal)   Pulse 96   Resp 12   Ht 5' 3.25" (1.607 m)   Wt 172 lb (78 kg)   BMI 30.23 kg/m   Physical Exam  NAD HEENT:  PERRL, EOMI Neck:  Supple, No adenopathy, no thyromegaly Chest:  CTA CV:  RRR without murmur or rub.  Radial and DP pulses normal and equal. LE:  No edema.   Assessment & Plan  1.  DM:  titrating Lantus up every 2 days if sugars above 150 by 2 units.  Tomorrow 22 units.   Diabetic eye exam referral.  2.  Hypertension:  Fair control.  Lifestyle changes with diet and physical activity to get into 120/70 range  3.  CKD:  Control  #1 and 2.  4.  Asthma and allergies:  Add Zyrtec 10 mg daily.  Continue inhalers  5.  Hyperlipidemia:  At goal save for HDL now with medication--needs more lifestyle changes.  6.  Urethral strictures- will call [facility] tomorrow to clarify what labs needed--suspect some sort of contrast study needed

## 2020-07-10 ENCOUNTER — Telehealth: Payer: Self-pay | Admitting: Internal Medicine

## 2020-07-10 NOTE — Telephone Encounter (Signed)
Spoke with [facility]. States they did not see any order for labs to de done for this patient. Call was transferred to Dr. Gilford Rile nurse and message was left for her to call back and let me know if patient does or does not need labs.

## 2020-07-10 NOTE — Telephone Encounter (Signed)
Patient scheduled for 07/12/20

## 2020-07-10 NOTE — Telephone Encounter (Signed)
Spoke with Elmyra Ricks at [facility]. States the only lab that patient was to have done is a PSA with Dx: N40.1. Elmyra Ricks states that this lab was ordered for patient to have done since March.  Nicky please contact patient and schedule lab appointment for above lab and added Dx code to lab note.

## 2020-07-12 ENCOUNTER — Other Ambulatory Visit: Payer: Self-pay

## 2020-07-12 DIAGNOSIS — N401 Enlarged prostate with lower urinary tract symptoms: Secondary | ICD-10-CM

## 2020-07-12 DIAGNOSIS — N138 Other obstructive and reflux uropathy: Secondary | ICD-10-CM

## 2020-07-13 LAB — PSA: Prostate Specific Ag, Serum: 1.7 ng/mL (ref 0.0–4.0)

## 2020-07-21 ENCOUNTER — Other Ambulatory Visit: Payer: Medicaid Other

## 2020-10-03 ENCOUNTER — Other Ambulatory Visit: Payer: Self-pay | Admitting: Internal Medicine

## 2020-10-04 ENCOUNTER — Encounter: Payer: Self-pay | Admitting: Internal Medicine

## 2020-10-04 ENCOUNTER — Ambulatory Visit: Payer: Medicaid Other | Admitting: Internal Medicine

## 2020-10-04 VITALS — BP 157/100 | HR 101 | Resp 16 | Ht 63.75 in | Wt 175.0 lb

## 2020-10-04 DIAGNOSIS — Z9189 Other specified personal risk factors, not elsewhere classified: Secondary | ICD-10-CM

## 2020-10-04 DIAGNOSIS — I10 Essential (primary) hypertension: Secondary | ICD-10-CM

## 2020-10-04 DIAGNOSIS — E1165 Type 2 diabetes mellitus with hyperglycemia: Secondary | ICD-10-CM

## 2020-10-04 DIAGNOSIS — Z0000 Encounter for general adult medical examination without abnormal findings: Secondary | ICD-10-CM

## 2020-10-04 DIAGNOSIS — E782 Mixed hyperlipidemia: Secondary | ICD-10-CM

## 2020-10-04 DIAGNOSIS — N183 Chronic kidney disease, stage 3 unspecified: Secondary | ICD-10-CM

## 2020-10-04 DIAGNOSIS — N189 Chronic kidney disease, unspecified: Secondary | ICD-10-CM
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
## 2020-10-05 ENCOUNTER — Telehealth: Payer: Self-pay | Admitting: Internal Medicine

## 2020-10-05 DIAGNOSIS — E1165 Type 2 diabetes mellitus with hyperglycemia: Secondary | ICD-10-CM

## 2020-10-05 NOTE — Telephone Encounter (Signed)
Pt. Called stating yesterday was provided with an [facility] flyer to  [facility] and set up optometry appointment. Pt. Stating called and was told that office is not operating no more they don't have optometrist. Patient stated was told to call to the [facility] location and was told their next available appointment will be in November.  Pt. Will like to see if there is other place where he can be referred.  FYI.- pt. Does not have current orange card just yet.

## 2020-10-06 NOTE — Telephone Encounter (Signed)
Spoke to pt.  And gave recommendations Pt. Will call [facility] and get on their scheduled for November and will call us to let us know if he gets in with them first so we can cancel [facility] referral.

## 2020-10-09 ENCOUNTER — Other Ambulatory Visit: Payer: Medicaid Other

## 2020-10-09 DIAGNOSIS — E782 Mixed hyperlipidemia: Secondary | ICD-10-CM

## 2020-10-09 DIAGNOSIS — E1165 Type 2 diabetes mellitus with hyperglycemia: Secondary | ICD-10-CM

## 2020-10-10 LAB — SPECIMEN STATUS

## 2020-10-10 LAB — LIPID PANEL W/O CHOL/HDL RATIO
Cholesterol, Total: 135 mg/dL (ref 100–199)
HDL: 37 mg/dL — ABNORMAL LOW (ref >39)
LDL Chol Calc (NIH): 79 mg/dL (ref 0–99)
Triglycerides: 101 mg/dL (ref 0–149)
VLDL Cholesterol Cal: 19 mg/dL (ref 5–40)

## 2020-10-12 LAB — SPECIMEN STATUS REPORT

## 2020-10-12 LAB — HGB A1C W/O EAG: Hgb A1c MFr Bld: 8.6 % — ABNORMAL HIGH (ref 4.8–5.6)

## 2020-11-29 ENCOUNTER — Other Ambulatory Visit: Payer: Self-pay | Admitting: Internal Medicine

## 2020-11-29 MED ORDER — LANTUS SOLOSTAR 100 UNIT/ML ~~LOC~~ SOPN: PEN_INJECTOR | SUBCUTANEOUS | 99 refills | Status: DC

## 2020-12-06 ENCOUNTER — Telehealth: Payer: Self-pay | Admitting: Internal Medicine

## 2020-12-06 NOTE — Telephone Encounter (Signed)
Patient called requesting a prescription for pen needles   Last night he got a toothpick on his right foot and wants to know if there is any major concerns he needs to be aware of since patient is diabetic. Please advise.

## 2020-12-07 NOTE — Telephone Encounter (Signed)
Spoke to Mr. Walter Harmon to get more information requested by Dr. Amil Amen  Pt. States he stepped on the toothpick and went in about an eight of an inch, a little red near his heel where it went in. It is not swollen, just a little sore and denies any fever  Regarding pen needles pt. States they are expensive to buy over the counter $50 for 100 needles [facility]  advise him to have her PCP send in a Rx for pen needles and maybe use a coupon so it can be cheaper

## 2020-12-07 NOTE — Telephone Encounter (Signed)
[facility] [address] [place] -

## 2020-12-08 MED ORDER — PEN NEEDLES 30G X 8 MM MISC
11 refills | Status: DC
Start: 2020-12-08 — End: 2023-10-22

## 2020-12-08 NOTE — Telephone Encounter (Signed)
Did he get all of the toothpick out? Will send in Rx for needles.

## 2020-12-08 NOTE — Addendum Note (Signed)
Addended by: Marcelino Duster on: 12/08/2020 03:56 PM   Modules accepted: Orders

## 2020-12-12 NOTE — Telephone Encounter (Signed)
Yes toothpick is out. Pt. Informed  of Rx sent to pharmacy

## 2020-12-31 IMAGING — US ULTRASOUND SCROTUM DOPPLER COMPLETE
1 series · 13 of 25 positions shown · non-contrast
Comparison: CT 04/29/2019

CLINICAL DATA: Right groin swelling

EXAM:
SCROTAL ULTRASOUND
DOPPLER ULTRASOUND OF THE TESTICLES
TECHNIQUE: Complete ultrasound examination of the testicles, epididymis, and
other scrotal structures was performed. Color and spectral Doppler
ultrasound were also utilized to evaluate blood flow to the
testicles.

[Series 1: ultrasound scrotum doppler complete · 13 of 75 slices shown]
[im 1/75]
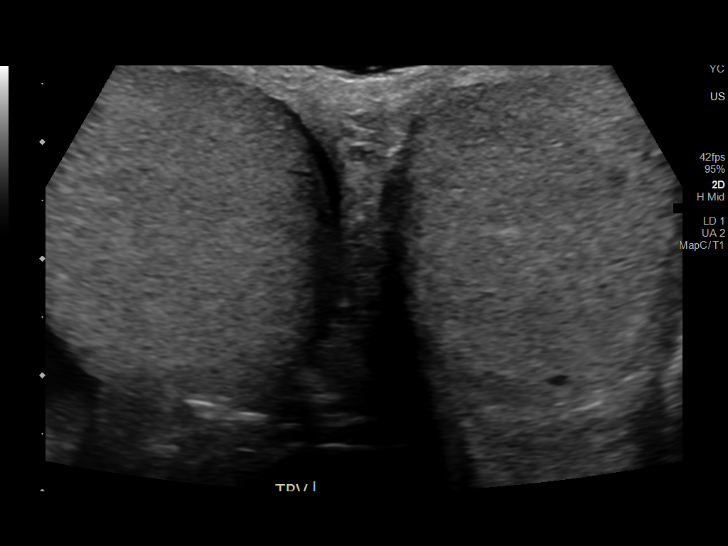
[im 7/75]
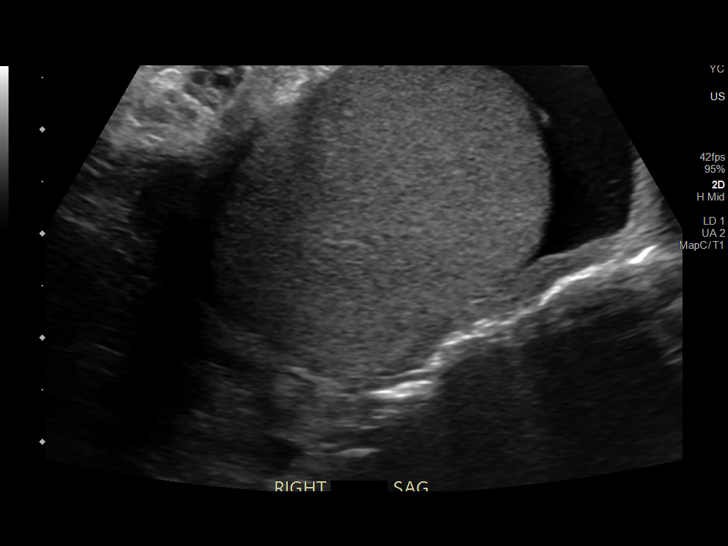
[im 13/75]
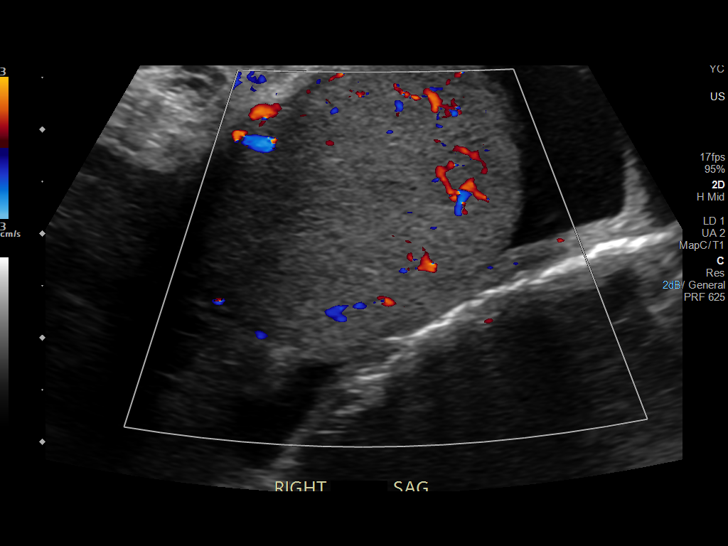
[im 19/75]
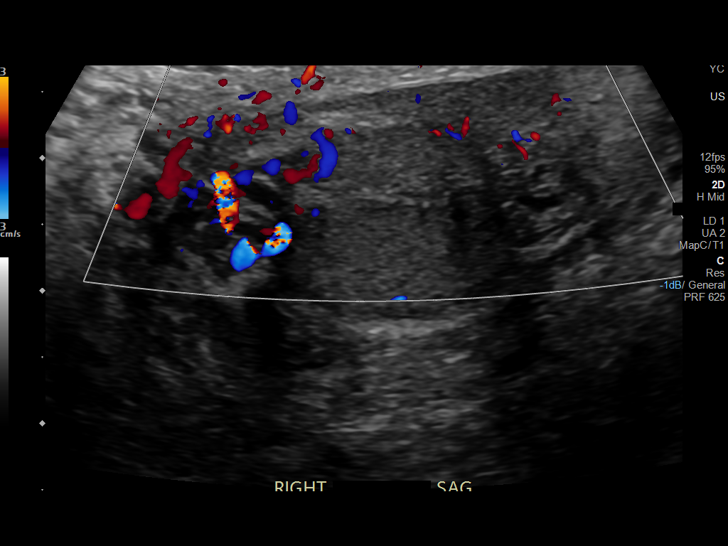
[im 25/75]
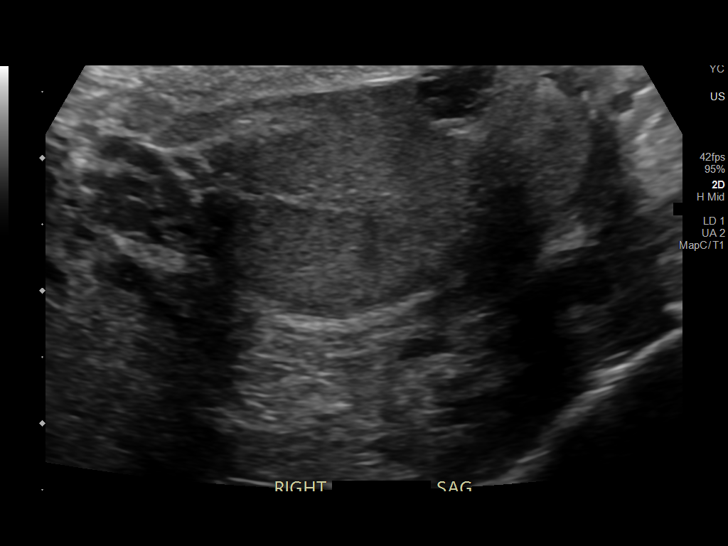
[im 31/75]
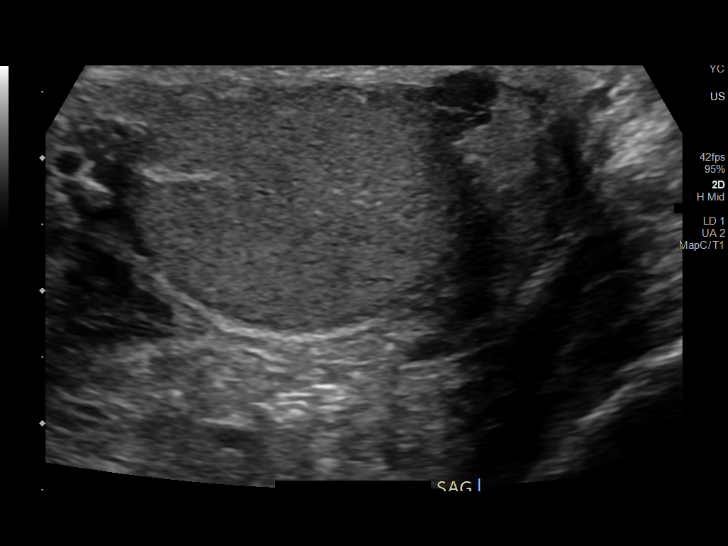
[im 38/75]
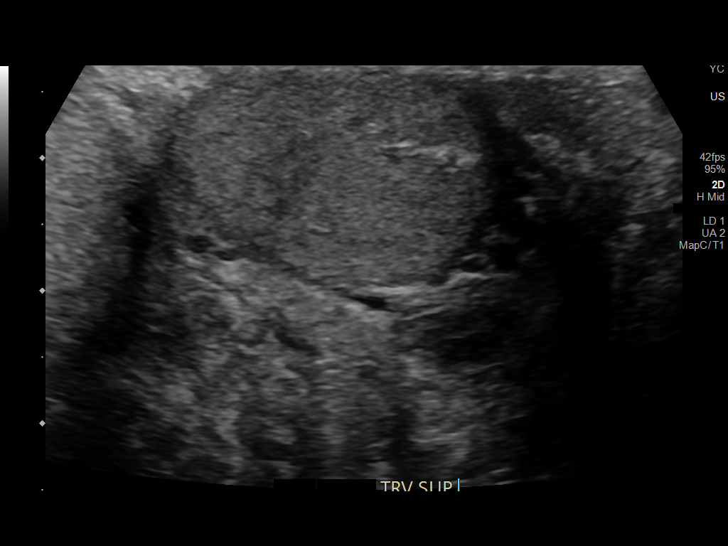
[im 44/75]
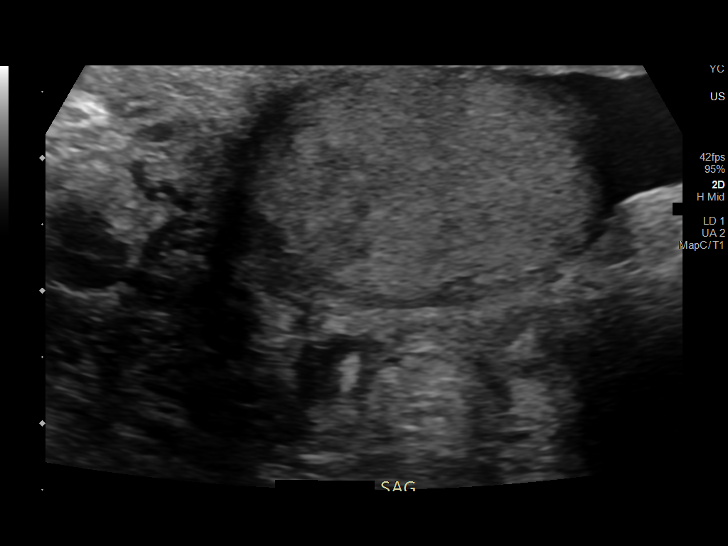
[im 50/75]
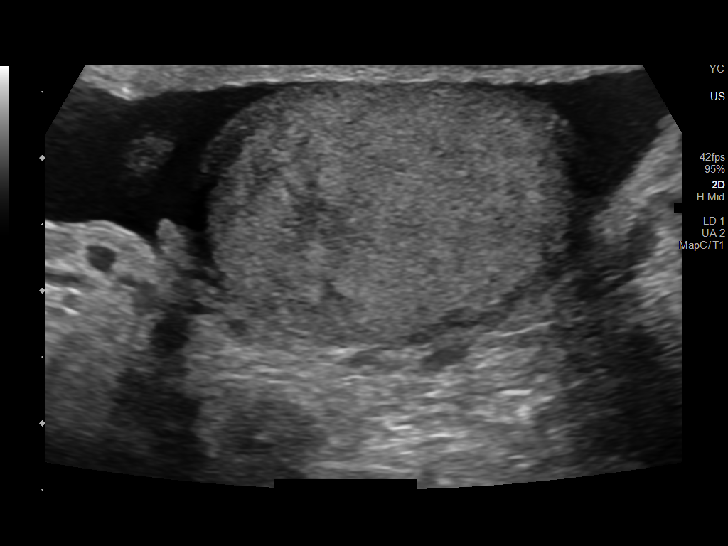
[im 56/75]
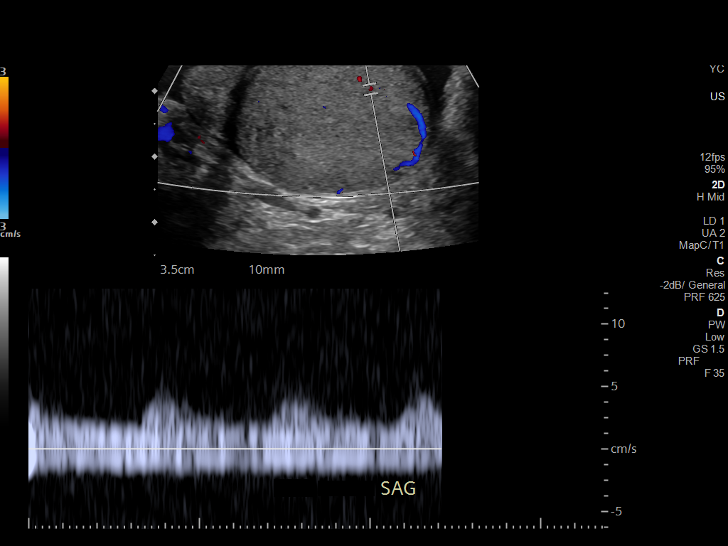
[im 62/75]
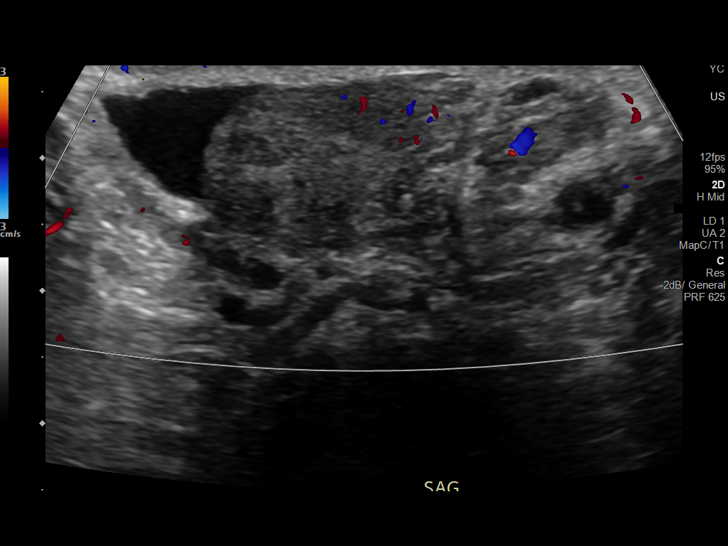
[im 68/75]
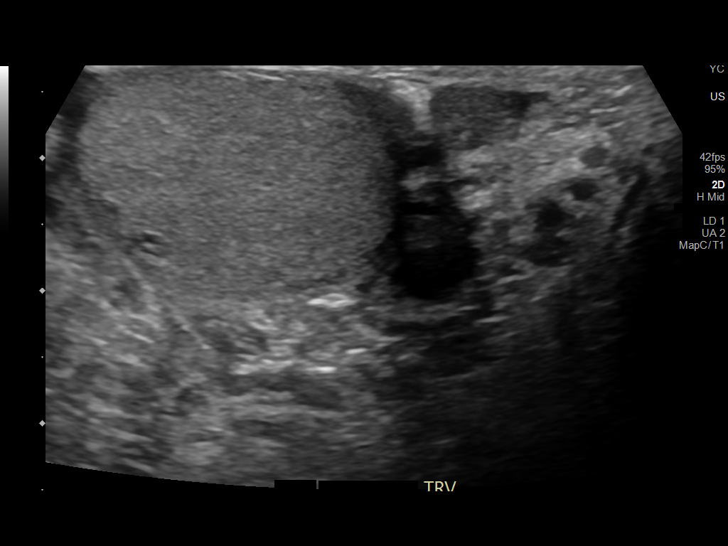
[im 75/75]
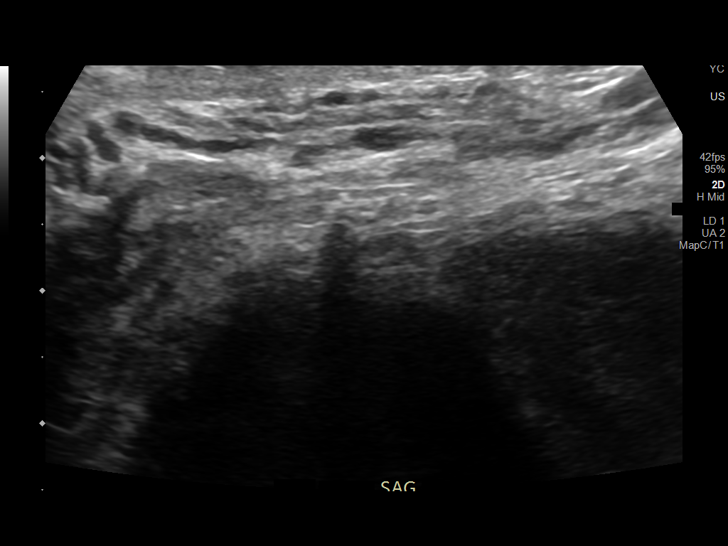

[13 of 25 positions shown; findings below may reference images not displayed]

FINDINGS: Right testicle

Measurements: 3.6 x 2.6 x 3 cm. No mass or microlithiasis
visualized.

Left testicle

Measurements: 3 x 3.1 x 2.1 cm. Heterogenous echotexture without
discrete mass.

Right epididymis:  Small septated cyst measuring 7 mm.

Left epididymis: Diffuse enlargement of the epididymal head without
a mass.

Hydrocele:  Small bilateral hydroceles.

Varicocele:  Small right varicocele.

Pulsed Doppler interrogation of both testes demonstrates normal low
resistance arterial and venous waveforms bilaterally.
IMPRESSION: 1. No definite evidence for testicular torsion.
2. Heterogenous appearing left testis, could be secondary to prior
insults. No discrete mass lesion. Mild diffuse enlargement of the
left epididymal head without a mass, is questionable for resolving
inflammatory change
3. Small bilateral hydroceles.  Small right varicocele

## 2021-01-04 ENCOUNTER — Other Ambulatory Visit: Payer: Self-pay

## 2021-01-04 ENCOUNTER — Ambulatory Visit: Payer: Medicaid Other | Admitting: Internal Medicine

## 2021-01-04 ENCOUNTER — Encounter: Payer: Self-pay | Admitting: Internal Medicine

## 2021-01-04 VITALS — BP 145/93 | HR 96 | Resp 12 | Ht 63.75 in | Wt 173.0 lb

## 2021-01-04 DIAGNOSIS — N189 Chronic kidney disease, unspecified: Secondary | ICD-10-CM

## 2021-01-04 DIAGNOSIS — I10 Essential (primary) hypertension: Secondary | ICD-10-CM

## 2021-01-04 DIAGNOSIS — E782 Mixed hyperlipidemia: Secondary | ICD-10-CM

## 2021-01-04 DIAGNOSIS — E1165 Type 2 diabetes mellitus with hyperglycemia: Secondary | ICD-10-CM

## 2021-01-04 DIAGNOSIS — N50811 Right testicular pain: Secondary | ICD-10-CM

## 2021-01-04 MED ORDER — NOVOLOG FLEXPEN 100 UNIT/ML ~~LOC~~ SOPN: 0.0000 [IU] | PEN_INJECTOR | Freq: Three times a day (TID) | SUBCUTANEOUS | 11 refills | Status: DC

## 2021-01-04 MED ORDER — LANTUS SOLOSTAR 100 UNIT/ML ~~LOC~~ SOPN
PEN_INJECTOR | SUBCUTANEOUS | 99 refills | Status: DC
Start: 2021-01-04 — End: 2022-01-11

## 2021-01-04 MED ORDER — ALLOPURINOL 100 MG PO TABS
100.0000 mg | ORAL_TABLET | Freq: Every day | ORAL | 11 refills | Status: DC
Start: 2021-01-04 — End: 2022-02-21

## 2021-01-04 MED ORDER — PROVENTIL HFA 108 (90 BASE) MCG/ACT IN AERS: INHALATION_SPRAY | RESPIRATORY_TRACT | 0 refills | Status: DC

## 2021-01-04 MED ORDER — HYDRALAZINE HCL 25 MG PO TABS
25.0000 mg | ORAL_TABLET | Freq: Two times a day (BID) | ORAL | 11 refills | Status: DC
Start: 2021-01-04 — End: 2021-02-23

## 2021-01-04 MED ORDER — AMLODIPINE-ATORVASTATIN 5-40 MG PO TABS: 1.0000 | ORAL_TABLET | Freq: Every day | ORAL | 11 refills | Status: DC

## 2021-01-04 MED ORDER — BUDESONIDE-FORMOTEROL FUMARATE 160-4.5 MCG/ACT IN AERO
INHALATION_SPRAY | RESPIRATORY_TRACT | 11 refills | Status: DC
Start: 2021-01-04 — End: 2021-01-13

## 2021-01-04 NOTE — Progress Notes (Signed)
  Subjective:    Patient ID: Walter Harmon, male   DOB: 12/06/1966, 54 y.o.   MRN: [id number]   HPI   1.  Hypertension:  Just started hydralazine 2 weeks ago.  Thinks he was out 2-3 months prior to restarting. Continues with Caduet as well.   Discussed at length whether he could get on a schedule to pick up his meds the same day of each month.  Feels he can do this with his pharmacy. Not clear how often finances are not enough--when ask specifically:  Lives with father, uses his car, can ask his father or sibling for help.  Gets food stamps as well.    2.  DM:  Poor historian.  Cannot give specific information.  States checking sugars.  276 this morning fasting.  130-190 before lunch.  105 last night before dinner.  Does say his highest was yesterday morning at 300+.  States ate pizza and french fries the night prior.  Fried chicken last night.  A1C in October was 8.6%.  Describes picking up premade meals from [place].   Using Lantus in the morning, but only using 24 units. Describes using Novolog appropriately as a sliding scale.  3.  Asthma:  Some issues with ups and downs with weather.  Using rescue inhaler every other day with weather changes.  If did not have these changes, would be using rescue inhaler every 2-3 weeks.  He is willing to use a second dose of Symbicort in the evening during the cold weather and changes in weather.   4.  Intermittent right testicular pain:  Sent to Urology in January of 2021, but could not keep appt as orange card expired and did not obtain new orange card to reset appt.   No significant findings in past with exam other than perhaps mildly enlarged right epididymis.  He continues to have symptoms intermittently and would like to be referred now he has orange card.  Current Meds  Medication Sig  . amLODipine-atorvastatin (CADUET) 5-40 MG tablet Take 1 tablet by mouth daily.  . aspirin 81 MG EC tablet Take 1 tablet (81 mg total) by mouth daily.   . blood glucose meter kit and supplies KIT Dispense glucometer and testing supplies based on patient and insurance preference. Use up to four times daily as directed. (FOR ICD-9 250.00, 250.01).  . budesonide-formoterol (SYMBICORT) 160-4.5 MCG/ACT inhaler INHALE TWO PUFFS INTO THE LUNGS DAILY  . hydrALAZINE (APRESOLINE) 25 MG tablet Take 1 tablet (25 mg total) by mouth 2 (two) times daily.  . insulin aspart (NOVOLOG FLEXPEN) 100 UNIT/ML FlexPen Inject 0-15 Units into the skin 3 (three) times daily with meals.  . insulin glargine (LANTUS SOLOSTAR) 100 UNIT/ML Solostar Pen Inject 30 units subcutaneously once daily  . Insulin Pen Needle (PEN NEEDLES) 30G X 8 MM MISC Inject insulin subcutaneously 4 times daily.  . Multiple Vitamin (MULTIVITAMIN WITH MINERALS) TABS tablet Take 1 tablet by mouth daily.  . PROVENTIL HFA 108 (90 Base) MCG/ACT inhaler INHALE TWO PUFFS BY MOUTH EVERY 6 HOURS AS NEEDED FOR WHEEZING   No Known Allergies  Immunization History  Administered Date(s) Administered  . Influenza,inj,Quad PF,6+ Mos 09/09/2013, 10/10/2015, 11/28/2016, 02/27/2018  . Influenza-Unspecified 09/11/2020  . PFIZER(Purple Top)SARS-COV-2 Vaccination 03/15/2020, 04/05/2020, 10/09/2020  . Pneumococcal Polysaccharide-23 01/24/2014  . Tdap 12/31/2019     Review of Systems    Objective:   BP (!) 145/93 (BP Location: Left Arm, Patient Position: Sitting, Cuff Size: Normal)   Pulse   96   Resp 12   Ht 5' 3.75" (1.619 m)   Wt 173 lb (78.5 kg)   BMI 29.93 kg/m   Physical Exam  NAD HEENT:  PERRL, EOMI, TMs pearly gray, throat without injection. Neck:  Supple, No adenopathy Chest:  CTA CV:  RRR with normal S1 and S2, No S3, S4 or murmur.  No carotid bruits.  Carotid, radial and DP pulses normal and equal Abd:  S, NT, No HSM or mass, + BS LE:  No edema.     Assessment & Plan  1.  DM, insulin requiring:  Urged patient to get his day more organized and take medication appropriately.  Encouraged him  to take Lantus 30 units daily as ordered.  Difficulties with regular use of meds continue. Encouraged better diet and regular physical activity as well.   2.  Hypertension with CKD:  Not taking meds.  Hydralazine sent to [facility] now he has orange card for improved savings.    3.  Asthma:  Encouraged using twice daily dosing of Symbicort for improved control, particularly during more symptomatic times of year.  4.  Right scrotal swelling and pain with difficulties urinating, intermittent.  Refer to Urology again now he has orange card.    5.  Hyperlipidemia:  Suspect missing Caduet as well.   CBC, CMP, A1C, Lipids  Follow up in 4 weeks for bp check after taking hydralazine regularly for amonth.  With me in 4 months. 

## 2021-01-05 ENCOUNTER — Ambulatory Visit: Payer: Medicaid Other | Admitting: Internal Medicine

## 2021-01-05 LAB — CBC WITH DIFFERENTIAL/PLATELET
Basophils Absolute: 0 10*3/uL (ref 0.0–0.2)
Basos: 1 %
EOS (ABSOLUTE): 0.5 10*3/uL — ABNORMAL HIGH (ref 0.0–0.4)
Eos: 7 %
Hematocrit: 44.9 % (ref 37.5–51.0)
Hemoglobin: 15.4 g/dL (ref 13.0–17.7)
Immature Grans (Abs): 0 10*3/uL (ref 0.0–0.1)
Immature Granulocytes: 0 %
Lymphocytes Absolute: 1.8 10*3/uL (ref 0.7–3.1)
Lymphs: 28 %
MCH: 30.7 pg (ref 26.6–33.0)
MCHC: 34.3 g/dL (ref 31.5–35.7)
MCV: 90 fL (ref 79–97)
Monocytes Absolute: 0.4 10*3/uL (ref 0.1–0.9)
Monocytes: 7 %
Neutrophils Absolute: 3.8 10*3/uL (ref 1.4–7.0)
Neutrophils: 57 %
Platelets: 241 10*3/uL (ref 150–450)
RBC: 5.01 x10E6/uL (ref 4.14–5.80)
RDW: 13 % (ref 11.6–15.4)
WBC: 6.6 10*3/uL (ref 3.4–10.8)

## 2021-01-05 LAB — COMPREHENSIVE METABOLIC PANEL
ALT: 21 IU/L (ref 0–44)
AST: 15 IU/L (ref 0–40)
Albumin/Globulin Ratio: 1.2 (ref 1.2–2.2)
Albumin: 4.1 g/dL (ref 3.8–4.9)
Alkaline Phosphatase: 97 IU/L (ref 44–121)
BUN/Creatinine Ratio: 7 — ABNORMAL LOW (ref 9–20)
BUN: 20 mg/dL (ref 6–24)
Bilirubin Total: 0.2 mg/dL (ref 0.0–1.2)
CO2: 21 mmol/L (ref 20–29)
Calcium: 9.9 mg/dL (ref 8.7–10.2)
Chloride: 106 mmol/L (ref 96–106)
Creatinine, Ser: 2.72 mg/dL — ABNORMAL HIGH (ref 0.76–1.27)
GFR calc Af Amer: 29 mL/min/{1.73_m2} — ABNORMAL LOW (ref >59)
GFR calc non Af Amer: 26 mL/min/{1.73_m2} — ABNORMAL LOW (ref >59)
Globulin, Total: 3.3 g/dL (ref 1.5–4.5)
Glucose: 106 mg/dL — ABNORMAL HIGH (ref 65–99)
Potassium: 4.1 mmol/L (ref 3.5–5.2)
Sodium: 142 mmol/L (ref 134–144)
Total Protein: 7.4 g/dL (ref 6.0–8.5)

## 2021-01-05 LAB — HGB A1C W/O EAG: Hgb A1c MFr Bld: 8 % — ABNORMAL HIGH (ref 4.8–5.6)

## 2021-01-11 ENCOUNTER — Other Ambulatory Visit: Payer: Self-pay | Admitting: Internal Medicine

## 2021-02-01 ENCOUNTER — Encounter: Payer: Medicaid Other | Admitting: Internal Medicine

## 2021-02-06 ENCOUNTER — Ambulatory Visit: Payer: Medicaid Other | Admitting: Internal Medicine

## 2021-02-06 ENCOUNTER — Other Ambulatory Visit: Payer: Self-pay

## 2021-02-06 VITALS — BP 139/97 | HR 90 | Resp 12

## 2021-02-06 DIAGNOSIS — I10 Essential (primary) hypertension: Secondary | ICD-10-CM

## 2021-02-06 NOTE — Progress Notes (Signed)
Pt. States he takes Caduet 5-40 mg tablet daily and Hydralazine 25 mg tablet twice daily and states he forgets to take the second dose at noon about two to three times a week   Pt. Advise to set an alarm for medications so that he does not miss any doses

## 2021-02-12 ENCOUNTER — Other Ambulatory Visit: Payer: Self-pay | Admitting: Internal Medicine

## 2021-02-21 ENCOUNTER — Other Ambulatory Visit: Payer: Self-pay | Admitting: Internal Medicine

## 2021-02-22 NOTE — Telephone Encounter (Signed)
This was sent to [facility] on 01/04/21.  Is his orange card no longer valid?

## 2021-02-23 ENCOUNTER — Telehealth: Payer: Self-pay | Admitting: Internal Medicine

## 2021-02-23 MED ORDER — HYDRALAZINE HCL 25 MG PO TABS: 25.0000 mg | ORAL_TABLET | Freq: Two times a day (BID) | ORAL | 11 refills | Status: DC

## 2021-02-23 NOTE — Telephone Encounter (Signed)
Patient stated that he was unaware of his prescription been at [facility]. His orange card is current so he will picking his prescription at [facility].

## 2021-02-23 NOTE — Telephone Encounter (Signed)
[facility] pharm did not receive Hydralazine Rx back in January.  Sending again.

## 2021-02-23 NOTE — Telephone Encounter (Signed)
I called the [facility] and Spoke with [facility], she mentioned that she does not have in her system the request for Rx Hydralazine. It shows in our system but no in theirs. Dawn is asking if you can please resend the Rx order.

## 2021-02-25 DIAGNOSIS — N50811 Right testicular pain: Secondary | ICD-10-CM | POA: Insufficient documentation

## 2021-02-25 DIAGNOSIS — I10 Essential (primary) hypertension: Secondary | ICD-10-CM | POA: Insufficient documentation

## 2021-02-25 DIAGNOSIS — E782 Mixed hyperlipidemia: Secondary | ICD-10-CM | POA: Insufficient documentation

## 2021-02-25 DIAGNOSIS — E1165 Type 2 diabetes mellitus with hyperglycemia: Secondary | ICD-10-CM | POA: Insufficient documentation

## 2021-02-26 NOTE — Telephone Encounter (Signed)
Patient was informed.

## 2021-03-01 NOTE — Telephone Encounter (Signed)
Dr. Amil Amen spoke with the pharmacist and resent the Rx.

## 2021-03-12 ENCOUNTER — Other Ambulatory Visit: Payer: Self-pay | Admitting: Internal Medicine

## 2021-05-11 ENCOUNTER — Ambulatory Visit: Payer: Medicaid Other | Admitting: Internal Medicine

## 2021-09-11 ENCOUNTER — Ambulatory Visit: Payer: Medicaid Other | Admitting: Internal Medicine

## 2021-10-22 ENCOUNTER — Ambulatory Visit: Payer: Medicaid Other | Admitting: Internal Medicine

## 2021-11-22 ENCOUNTER — Ambulatory Visit: Payer: Medicaid Other | Admitting: Internal Medicine

## 2021-12-04 ENCOUNTER — Other Ambulatory Visit: Payer: Self-pay

## 2021-12-04 MED ORDER — BUDESONIDE-FORMOTEROL FUMARATE 160-4.5 MCG/ACT IN AERO: INHALATION_SPRAY | RESPIRATORY_TRACT | 0 refills | Status: DC

## 2021-12-06 ENCOUNTER — Ambulatory Visit: Payer: Medicaid Other | Admitting: Internal Medicine

## 2022-01-08 ENCOUNTER — Other Ambulatory Visit: Payer: Self-pay | Admitting: Internal Medicine

## 2022-01-11 NOTE — Telephone Encounter (Signed)
Needs appt to get any more refills--filling for a month right now and has to make it or will not refill again--lots of cancellations

## 2022-02-06 ENCOUNTER — Other Ambulatory Visit: Payer: Self-pay | Admitting: Internal Medicine

## 2022-02-21 ENCOUNTER — Ambulatory Visit (INDEPENDENT_AMBULATORY_CARE_PROVIDER_SITE_OTHER): Payer: Self-pay | Admitting: Internal Medicine

## 2022-02-21 ENCOUNTER — Other Ambulatory Visit: Payer: Self-pay

## 2022-02-21 ENCOUNTER — Encounter: Payer: Self-pay | Admitting: Internal Medicine

## 2022-02-21 VITALS — BP 128/84 | HR 100 | Resp 12 | Ht 64.0 in | Wt 164.0 lb

## 2022-02-21 DIAGNOSIS — N189 Chronic kidney disease, unspecified: Secondary | ICD-10-CM

## 2022-02-21 DIAGNOSIS — E1165 Type 2 diabetes mellitus with hyperglycemia: Secondary | ICD-10-CM

## 2022-02-21 DIAGNOSIS — I10 Essential (primary) hypertension: Secondary | ICD-10-CM

## 2022-02-21 DIAGNOSIS — E782 Mixed hyperlipidemia: Secondary | ICD-10-CM

## 2022-02-21 MED ORDER — PANTOPRAZOLE SODIUM 40 MG PO TBEC: 40.0000 mg | DELAYED_RELEASE_TABLET | Freq: Every day | ORAL | 11 refills | Status: DC

## 2022-02-21 MED ORDER — ALLOPURINOL 100 MG PO TABS
100.0000 mg | ORAL_TABLET | Freq: Every day | ORAL | 11 refills | Status: DC
Start: 2022-02-21 — End: 2023-08-22

## 2022-02-21 MED ORDER — AMLODIPINE-ATORVASTATIN 5-40 MG PO TABS: 1.0000 | ORAL_TABLET | Freq: Every day | ORAL | 11 refills | Status: DC

## 2022-02-21 MED ORDER — NOVOLOG FLEXPEN 100 UNIT/ML ~~LOC~~ SOPN: PEN_INJECTOR | SUBCUTANEOUS | 11 refills | Status: DC

## 2022-02-21 MED ORDER — HYDRALAZINE HCL 25 MG PO TABS: 25.0000 mg | ORAL_TABLET | Freq: Two times a day (BID) | ORAL | 11 refills | Status: DC

## 2022-02-21 MED ORDER — LANTUS SOLOSTAR 100 UNIT/ML ~~LOC~~ SOPN: PEN_INJECTOR | SUBCUTANEOUS | 11 refills | Status: DC

## 2022-02-21 MED ORDER — BUDESONIDE-FORMOTEROL FUMARATE 160-4.5 MCG/ACT IN AERO
INHALATION_SPRAY | RESPIRATORY_TRACT | 11 refills | Status: DC
Start: 2022-02-21 — End: 2023-02-16

## 2022-02-21 NOTE — Progress Notes (Signed)
° ° °  Subjective:    Patient ID: Walter Harmon, male   DOB: 18-Jan-1966, 56 y.o.   MRN: [id number]   HPI  Poor follow up. Has not been seen in over a year.   DM:  not taking insulin as recommended.  Taking Lantus at 6:45 a.m., then takes Novolog right after.  Only gives himself 24 units of Lantus in the morning.  Uses sliding scale for Novolog.  Generally, gives himself 5 units.  Unfortunately, does not eat for almost 2 hours after giving insulin. Will have a sausage biscuit on way to work.   Sweet iced tea.   Meat and veggies at dinner.  Eats take out a lot.  2.  Hypertension:  taking Caduet.  3.  Hyperlipidemia:  Taking Caduet  4.  CKD:  due to poorly controlled DM and Hypertension.  Another discussion about controlling DM better.    Current Meds  Medication Sig   blood glucose meter kit and supplies KIT Dispense glucometer and testing supplies based on patient and insurance preference. Use up to four times daily as directed. (FOR ICD-9 250.00, 250.01).   budesonide-formoterol (SYMBICORT) 160-4.5 MCG/ACT inhaler INHALE TWO PUFFS INTO THE LUNGS 2 TIMES A DAY   CADUET 5-40 MG tablet TAKE 1 TABLET BY MOUTH DAILY.   Insulin Pen Needle (PEN NEEDLES) 30G X 8 MM MISC Inject insulin subcutaneously 4 times daily.   LANTUS SOLOSTAR 100 UNIT/ML Solostar Pen INJECT 30 UNITS INTO THE SKIN ONCE DAILY   NOVOLOG FLEXPEN 100 UNIT/ML FlexPen INJECT 0-15 UNITS INTO THE SKIN 3 TIMES A DAY WITH MEALS.   PROAIR HFA 108 (90 Base) MCG/ACT inhaler INHALE TWO PUFFS BY MOUTH EVERY 6 HOURS AS NEEDED FOR WHEEZING   No Known Allergies   Review of Systems    Objective:   BP 128/84 (BP Location: Left Arm, Patient Position: Sitting, Cuff Size: Normal)   Pulse 100   Resp 12   Ht 5\' 4"  (1.626 m)   Wt 164 lb (74.4 kg)   BMI 28.15 kg/m   Physical Exam NAD HEENT:   PERRL, EOMI, TMs pearly gray Neck:  Supple No adenopathy Chest:  CTA CV:  RRR with normal S1 and S2, No S3, S4 or murmur.  No carotid  bruits.  Carotid, radial and DP pulses normal and equal Abd:  S, NT, No HSM or mass, + BS LE:  No edema.   Assessment & Plan    DM:  discussed uses of particularly short acting insulin only 10 minutes before meal to avoid low blood sugars and chasing sugars.  Would like him to take morning lantus just before breakfast as well.  Discussed simple, healthier options rather than fast food take out as well.  He does not seem to have insight into changing poor habits to decrease risk of long term health complications, such as CKD.  We have discussed how to take insulin multiple times previously. Encouraged yearly DM eye exam.  Recommend Nicolette Bang as affordable.  Encouraged [employer] bivalent.  2.  Hypertension:  adequate control  3.  Hyperlipidemia:  Fasting labs in next 2 weeks.  4.  HM:  encouraged yearly influenza vaccine, COVID bivalent (we only have Moderna currently and he would like to continue with [employer].  PSA with fasting labs.

## 2022-03-08 ENCOUNTER — Other Ambulatory Visit: Payer: Self-pay

## 2022-03-08 ENCOUNTER — Other Ambulatory Visit: Payer: Self-pay | Admitting: Internal Medicine

## 2022-03-08 DIAGNOSIS — E782 Mixed hyperlipidemia: Secondary | ICD-10-CM

## 2022-03-08 DIAGNOSIS — E1165 Type 2 diabetes mellitus with hyperglycemia: Secondary | ICD-10-CM

## 2022-03-08 DIAGNOSIS — Z79899 Other long term (current) drug therapy: Secondary | ICD-10-CM

## 2022-03-08 DIAGNOSIS — Z125 Encounter for screening for malignant neoplasm of prostate: Secondary | ICD-10-CM

## 2022-03-09 LAB — COMPREHENSIVE METABOLIC PANEL
ALT: 12 IU/L (ref 0–44)
AST: 10 IU/L (ref 0–40)
Albumin/Globulin Ratio: 1.3 (ref 1.2–2.2)
Albumin: 4.1 g/dL (ref 3.8–4.9)
Alkaline Phosphatase: 77 IU/L (ref 44–121)
BUN/Creatinine Ratio: 8 — ABNORMAL LOW (ref 9–20)
BUN: 24 mg/dL (ref 6–24)
Bilirubin Total: 0.5 mg/dL (ref 0.0–1.2)
CO2: 20 mmol/L (ref 20–29)
Calcium: 9.3 mg/dL (ref 8.7–10.2)
Chloride: 106 mmol/L (ref 96–106)
Creatinine, Ser: 2.88 mg/dL — ABNORMAL HIGH (ref 0.76–1.27)
Globulin, Total: 3.2 g/dL (ref 1.5–4.5)
Glucose: 105 mg/dL — ABNORMAL HIGH (ref 70–99)
Potassium: 3.9 mmol/L (ref 3.5–5.2)
Sodium: 140 mmol/L (ref 134–144)
Total Protein: 7.3 g/dL (ref 6.0–8.5)
eGFR: 25 mL/min/{1.73_m2} — ABNORMAL LOW (ref >59)

## 2022-03-09 LAB — CBC WITH DIFFERENTIAL/PLATELET
Basophils Absolute: 0 10*3/uL (ref 0.0–0.2)
Basos: 0 %
EOS (ABSOLUTE): 0.2 10*3/uL (ref 0.0–0.4)
Eos: 3 %
Hematocrit: 43.9 % (ref 37.5–51.0)
Hemoglobin: 14.9 g/dL (ref 13.0–17.7)
Immature Grans (Abs): 0 10*3/uL (ref 0.0–0.1)
Immature Granulocytes: 0 %
Lymphocytes Absolute: 1.3 10*3/uL (ref 0.7–3.1)
Lymphs: 23 %
MCH: 30.7 pg (ref 26.6–33.0)
MCHC: 33.9 g/dL (ref 31.5–35.7)
MCV: 91 fL (ref 79–97)
Monocytes Absolute: 0.5 10*3/uL (ref 0.1–0.9)
Monocytes: 8 %
Neutrophils Absolute: 3.8 10*3/uL (ref 1.4–7.0)
Neutrophils: 66 %
Platelets: 229 10*3/uL (ref 150–450)
RBC: 4.85 x10E6/uL (ref 4.14–5.80)
RDW: 12.9 % (ref 11.6–15.4)
WBC: 5.7 10*3/uL (ref 3.4–10.8)

## 2022-03-09 LAB — PSA: Prostate Specific Ag, Serum: 1.7 ng/mL (ref 0.0–4.0)

## 2022-03-09 LAB — MICROALBUMIN / CREATININE URINE RATIO
Creatinine, Urine: 91.2 mg/dL
Microalb/Creat Ratio: 650 mg/g creat — ABNORMAL HIGH (ref 0–29)
Microalbumin, Urine: 592.9 ug/mL

## 2022-03-09 LAB — LIPID PANEL W/O CHOL/HDL RATIO
Cholesterol, Total: 128 mg/dL (ref 100–199)
HDL: 36 mg/dL — ABNORMAL LOW (ref >39)
LDL Chol Calc (NIH): 76 mg/dL (ref 0–99)
Triglycerides: 82 mg/dL (ref 0–149)
VLDL Cholesterol Cal: 16 mg/dL (ref 5–40)

## 2022-03-09 LAB — HEMOGLOBIN A1C
Est. average glucose Bld gHb Est-mCnc: 263 mg/dL
Hgb A1c MFr Bld: 10.8 % — ABNORMAL HIGH (ref 4.8–5.6)

## 2022-06-03 ENCOUNTER — Other Ambulatory Visit: Payer: Medicaid Other

## 2022-06-03 ENCOUNTER — Ambulatory Visit: Payer: Medicaid Other | Admitting: Internal Medicine

## 2022-06-06 ENCOUNTER — Other Ambulatory Visit: Payer: Self-pay

## 2022-06-06 DIAGNOSIS — E1165 Type 2 diabetes mellitus with hyperglycemia: Secondary | ICD-10-CM

## 2022-06-07 ENCOUNTER — Other Ambulatory Visit: Payer: Medicaid Other

## 2022-06-07 LAB — HEMOGLOBIN A1C
Est. average glucose Bld gHb Est-mCnc: 186 mg/dL
Hgb A1c MFr Bld: 8.1 % — ABNORMAL HIGH (ref 4.8–5.6)

## 2022-07-05 ENCOUNTER — Encounter: Payer: Self-pay | Admitting: Internal Medicine

## 2022-07-05 ENCOUNTER — Ambulatory Visit: Payer: Medicaid Other | Admitting: Internal Medicine

## 2022-07-05 VITALS — BP 148/90 | HR 92 | Ht 64.0 in | Wt 177.0 lb

## 2022-07-05 DIAGNOSIS — N189 Chronic kidney disease, unspecified: Secondary | ICD-10-CM | POA: Diagnosis not present

## 2022-07-05 DIAGNOSIS — E1165 Type 2 diabetes mellitus with hyperglycemia: Secondary | ICD-10-CM

## 2022-07-05 DIAGNOSIS — E782 Mixed hyperlipidemia: Secondary | ICD-10-CM | POA: Diagnosis not present

## 2022-07-05 DIAGNOSIS — B351 Tinea unguium: Secondary | ICD-10-CM

## 2022-07-05 DIAGNOSIS — I10 Essential (primary) hypertension: Secondary | ICD-10-CM | POA: Diagnosis not present

## 2022-07-05 DIAGNOSIS — B353 Tinea pedis: Secondary | ICD-10-CM

## 2022-07-05 MED ORDER — DAPAGLIFLOZIN PROPANEDIOL 10 MG PO TABS: 10.0000 mg | ORAL_TABLET | Freq: Every day | ORAL | 6 refills | Status: DC

## 2022-07-05 MED ORDER — TERBINAFINE HCL 250 MG PO TABS: ORAL_TABLET | ORAL | 0 refills | Status: DC

## 2022-07-05 NOTE — Progress Notes (Signed)
Subjective:    Patient ID: Walter Harmon, male   DOB: [birth year], 56 y.o.   MRN: [id number]   HPI    DM:  Was started on Farxiga reportedly by [facility], Dr. Joelyn Oms.  He does not know the dosage.  He states he needs refills.  Apparently receiving through [facility] from description.  States he is checking sugars--states 3 times daily.  States lowest is 95 to 188.  Injecting Lantus 22 units and still giving 1 hour prior.   Still giving Novolog 30 minutes before eating breakfast.  Often eating fruit at work or may grab a sausage egg biscuit from fast food.  A1C was definitely improved however to 8.1%.  Cannot say if had eye check this year.    2.  Hypertension:  Still intermittently running out of hydralazine.  Very difficult to pin down when this last occurred.    3.  Hyperlipidemia:  fair control last check Lipid Panel     Component Value Date/Time   CHOL 128 03/08/2022 0927   TRIG 82 03/08/2022 0927   HDL 36 (L) 03/08/2022 0927   CHOLHDL 9.2 05/24/2019 0401   VLDL 38 05/24/2019 0401   LDLCALC 76 03/08/2022 0927   LABVLDL 16 03/08/2022 0927     Current Meds  Medication Sig   amLODipine-atorvastatin (CADUET) 5-40 MG tablet Take 1 tablet by mouth daily.   blood glucose meter kit and supplies KIT Dispense glucometer and testing supplies based on patient and insurance preference. Use up to four times daily as directed. (FOR ICD-9 250.00, 250.01).   budesonide-formoterol (SYMBICORT) 160-4.5 MCG/ACT inhaler INHALE TWO PUFFS INTO THE LUNGS 2 TIMES A DAY   Dapagliflozin Propanediol (FARXIGA PO) Take by mouth. Unsure of dose. 1 tab my mouth daily   hydrALAZINE (APRESOLINE) 25 MG tablet TAKE 1 TABLET (25MG TOTAL) BY MOUTH 2 TIMES A DAY   insulin aspart (NOVOLOG FLEXPEN) 100 UNIT/ML FlexPen INJECT 0-15 UNITS INTO THE SKIN 3 TIMES A DAY WITH MEALS.   insulin glargine (LANTUS SOLOSTAR) 100 UNIT/ML Solostar Pen Inject 22 units SQ every morning.   Insulin Pen Needle (PEN  NEEDLES) 30G X 8 MM MISC Inject insulin subcutaneously 4 times daily.   PROAIR HFA 108 (90 Base) MCG/ACT inhaler INHALE TWO PUFFS BY MOUTH EVERY 6 HOURS AS NEEDED FOR WHEEZING   No Known Allergies   Review of Systems    Objective:   BP (!) 148/90 (BP Location: Left Arm, Patient Position: Sitting, Cuff Size: Normal)   Pulse 92   Ht 5' 4"$  (1.626 m)   Wt 177 lb (80.3 kg)   BMI 30.38 kg/m     Physical Exam HENT:     Head: Normocephalic and atraumatic.     Right Ear: Tympanic membrane, ear canal and external ear normal.     Left Ear: Tympanic membrane, ear canal and external ear normal.     Mouth/Throat:     Mouth: Mucous membranes are moist.     Pharynx: Oropharynx is clear.  Eyes:     Extraocular Movements: Extraocular movements intact.     Conjunctiva/sclera: Conjunctivae normal.     Pupils: Pupils are equal, round, and reactive to light.     Comments: Discs sharp  Cardiovascular:     Rate and Rhythm: Normal rate and regular rhythm.     Heart sounds: S1 normal and S2 normal. No murmur heard.    No friction rub. No S3 or S4 sounds.     Comments: No  carotid bruits.  Carotid, radial, femoral, DP and PT pulses normal and equal.   Pulmonary:     Effort: Pulmonary effort is normal.     Breath sounds: Normal breath sounds.  Abdominal:     General: Bowel sounds are normal.     Palpations: Abdomen is soft. There is no mass.     Tenderness: There is no abdominal tenderness.  Musculoskeletal:     Cervical back: Normal range of motion and neck supple.     Right lower leg: No edema.     Left lower leg: No edema.  Feet:     Right foot:     Skin integrity: Dry skin present.     Toenail Condition: Right toenails are abnormally thick. Fungal disease present.    Left foot:     Skin integrity: Dry skin present.     Toenail Condition: Left toenails are abnormally thick. Fungal disease present. Neurological:     Mental Status: He is alert.    Diabetic Foot Exam - Simple    Simple Foot Form Diabetic Foot exam was performed with the following findings: Yes 07/05/2022 10:51 AM  Visual Inspection See comments: Yes Sensation Testing Intact to touch and monofilament testing bilaterally: Yes Pulse Check Posterior Tibialis and Dorsalis pulse intact bilaterally: Yes Comments Toenails diffusely thickened and discolored with scaling in between toes, particularly 1st and 2nd bilaterally.      Assessment & Plan   DM:  chronically poorly controlled, but improved with start of Farxiga.  Refill and continue insulin.  Repeat A1C in 3 months and F/U with me thereafter  2.  Hypertension:  Continues to miss meds and cannot say how often. Discussion again regarding worsening kidney function.  Follow up in 1 month for BP and pulse check after not missing meds for 1 month.  CMP then as well.  3.  Toenail onychomycosis/Tinea pedis:  Terbinafine half dose with CKD daily for 84 days.  4.  CKD as per nephrology  5.  Hyperlipidemia:  improved, though not quite at goal

## 2022-07-05 NOTE — Patient Instructions (Signed)
[employer] for Avocado Toast  2% [place] Yogurt, toasted almond slices, cup of berries--red raspberries, blackberries, blueberry mix is good

## 2022-08-02 ENCOUNTER — Other Ambulatory Visit: Payer: Medicaid Other

## 2022-08-27 ENCOUNTER — Telehealth: Payer: Self-pay | Admitting: Internal Medicine

## 2022-08-27 NOTE — Telephone Encounter (Signed)
[facility] called to inform us that pt has not filled Hydralazine 25mg  since March. They wanted to know if it was okay to fill. Dr. Amil Amen approved to refill.

## 2022-09-25 ENCOUNTER — Other Ambulatory Visit: Payer: Medicaid Other

## 2022-10-03 ENCOUNTER — Ambulatory Visit: Payer: Medicaid Other | Admitting: Internal Medicine

## 2022-11-28 ENCOUNTER — Other Ambulatory Visit: Payer: Medicaid Other

## 2022-12-02 ENCOUNTER — Other Ambulatory Visit: Payer: Medicaid Other

## 2022-12-03 ENCOUNTER — Ambulatory Visit: Payer: Medicaid Other | Admitting: Internal Medicine

## 2022-12-20 ENCOUNTER — Other Ambulatory Visit (INDEPENDENT_AMBULATORY_CARE_PROVIDER_SITE_OTHER): Payer: Medicaid Other

## 2022-12-20 DIAGNOSIS — J101 Influenza due to other identified influenza virus with other respiratory manifestations: Secondary | ICD-10-CM | POA: Diagnosis not present

## 2022-12-20 DIAGNOSIS — R0981 Nasal congestion: Secondary | ICD-10-CM

## 2022-12-20 LAB — POCT INFLUENZA A/B
Influenza A, POC: POSITIVE — AB
Influenza B, POC: NEGATIVE

## 2022-12-20 LAB — POC COVID19 BINAXNOW: SARS Coronavirus 2 Ag: NEGATIVE

## 2022-12-20 MED ORDER — OSELTAMIVIR PHOSPHATE 75 MG PO CAPS: 75.0000 mg | ORAL_CAPSULE | Freq: Two times a day (BID) | ORAL | 0 refills | Status: DC

## 2022-12-20 NOTE — Progress Notes (Signed)
Patient came in for Flu and Covid test after experiencing congestion and body aches for the past 3 days. Patient has taken Nyquil which has helped some.  Patient was positive for Influenza (A) and would like recommendations.   Tamiflu 75mg  1 cap twice daily for 5 days as been sent to the pharmacy. Patient instructed to call us if he does not get better after 5 days.

## 2022-12-24 NOTE — Addendum Note (Signed)
Addended by: Mariah Milling on: 12/24/2022 03:11 PM   Modules accepted: Level of Service

## 2022-12-26 ENCOUNTER — Other Ambulatory Visit: Payer: Self-pay | Admitting: Internal Medicine

## 2023-01-16 ENCOUNTER — Other Ambulatory Visit: Payer: Medicaid Other

## 2023-01-21 ENCOUNTER — Ambulatory Visit: Payer: Medicaid Other | Admitting: Internal Medicine

## 2023-02-07 ENCOUNTER — Other Ambulatory Visit: Payer: Medicaid Other

## 2023-02-07 DIAGNOSIS — Z79899 Other long term (current) drug therapy: Secondary | ICD-10-CM

## 2023-02-07 DIAGNOSIS — E1165 Type 2 diabetes mellitus with hyperglycemia: Secondary | ICD-10-CM | POA: Diagnosis not present

## 2023-02-08 LAB — COMPREHENSIVE METABOLIC PANEL
ALT: 13 IU/L (ref 0–44)
AST: 10 IU/L (ref 0–40)
Albumin/Globulin Ratio: 1.3 (ref 1.2–2.2)
Albumin: 3.9 g/dL (ref 3.8–4.9)
Alkaline Phosphatase: 89 IU/L (ref 44–121)
BUN/Creatinine Ratio: 9 (ref 9–20)
BUN: 28 mg/dL — ABNORMAL HIGH (ref 6–24)
Bilirubin Total: 0.3 mg/dL (ref 0.0–1.2)
CO2: 17 mmol/L — ABNORMAL LOW (ref 20–29)
Calcium: 9.2 mg/dL (ref 8.7–10.2)
Chloride: 104 mmol/L (ref 96–106)
Creatinine, Ser: 3.14 mg/dL — ABNORMAL HIGH (ref 0.76–1.27)
Globulin, Total: 3.1 g/dL (ref 1.5–4.5)
Glucose: 307 mg/dL — ABNORMAL HIGH (ref 70–99)
Potassium: 4.7 mmol/L (ref 3.5–5.2)
Sodium: 140 mmol/L (ref 134–144)
Total Protein: 7 g/dL (ref 6.0–8.5)
eGFR: 23 mL/min/{1.73_m2} — ABNORMAL LOW (ref >59)

## 2023-02-08 LAB — HEMOGLOBIN A1C
Est. average glucose Bld gHb Est-mCnc: 240 mg/dL
Hgb A1c MFr Bld: 10 % — ABNORMAL HIGH (ref 4.8–5.6)

## 2023-02-11 ENCOUNTER — Ambulatory Visit: Payer: Medicaid Other | Admitting: Internal Medicine

## 2023-02-12 ENCOUNTER — Other Ambulatory Visit: Payer: Self-pay | Admitting: Internal Medicine

## 2023-04-25 ENCOUNTER — Emergency Department (HOSPITAL_COMMUNITY): Payer: Self-pay

## 2023-04-25 ENCOUNTER — Emergency Department (HOSPITAL_COMMUNITY)
Admission: EM | Admit: 2023-04-25 | Discharge: 2023-04-26 | Disposition: A | Discharge status: Home / Self Care | Payer: Self-pay | Attending: Emergency Medicine | Admitting: Emergency Medicine

## 2023-04-25 ENCOUNTER — Encounter (HOSPITAL_COMMUNITY): Payer: Self-pay

## 2023-04-25 DIAGNOSIS — L03011 Cellulitis of right finger: Secondary | ICD-10-CM | POA: Insufficient documentation

## 2023-04-25 DIAGNOSIS — Z7982 Long term (current) use of aspirin: Secondary | ICD-10-CM | POA: Insufficient documentation

## 2023-04-25 LAB — CBC WITH DIFFERENTIAL/PLATELET
Abs Immature Granulocytes: 0.02 10*3/uL (ref 0.00–0.07)
Basophils Absolute: 0 10*3/uL (ref 0.0–0.1)
Basophils Relative: 0 %
Eosinophils Absolute: 0.3 10*3/uL (ref 0.0–0.5)
Eosinophils Relative: 3 %
HCT: 42 % (ref 39.0–52.0)
Hemoglobin: 14.2 g/dL (ref 13.0–17.0)
Immature Granulocytes: 0 %
Lymphocytes Relative: 29 %
Lymphs Abs: 2.6 10*3/uL (ref 0.7–4.0)
MCH: 30.6 pg (ref 26.0–34.0)
MCHC: 33.8 g/dL (ref 30.0–36.0)
MCV: 90.5 fL (ref 80.0–100.0)
Monocytes Absolute: 0.6 10*3/uL (ref 0.1–1.0)
Monocytes Relative: 7 %
Neutro Abs: 5.5 10*3/uL (ref 1.7–7.7)
Neutrophils Relative %: 61 %
Platelets: 227 10*3/uL (ref 150–400)
RBC: 4.64 MIL/uL (ref 4.22–5.81)
RDW: 12.6 % (ref 11.5–15.5)
WBC: 9 10*3/uL (ref 4.0–10.5)
nRBC: 0 % (ref 0.0–0.2)

## 2023-04-25 LAB — BASIC METABOLIC PANEL
Anion gap: 9 (ref 5–15)
BUN: 37 mg/dL — ABNORMAL HIGH (ref 6–20)
CO2: 21 mmol/L — ABNORMAL LOW (ref 22–32)
Calcium: 8.8 mg/dL — ABNORMAL LOW (ref 8.9–10.3)
Chloride: 104 mmol/L (ref 98–111)
Creatinine, Ser: 3.6 mg/dL — ABNORMAL HIGH (ref 0.61–1.24)
GFR, Estimated: 19 mL/min — ABNORMAL LOW (ref >60)
Glucose, Bld: 178 mg/dL — ABNORMAL HIGH (ref 70–99)
Potassium: 3.9 mmol/L (ref 3.5–5.1)
Sodium: 134 mmol/L — ABNORMAL LOW (ref 135–145)

## 2023-04-25 LAB — LACTIC ACID, PLASMA: Lactic Acid, Venous: 0.8 mmol/L (ref 0.5–1.9)

## 2023-04-25 NOTE — ED Triage Notes (Signed)
Pt states that he hurt his finger about a week ago, R middle and now has swelling to tip of middle finger around the nail bed

## 2023-04-25 NOTE — ED Provider Triage Note (Signed)
Emergency Medicine Provider Triage Evaluation Note  KRISTIN ROSSITER , a 57 y.o. male  was evaluated in triage.  Pt complains of right middle finger infection.  He states about 3 days ago it started stinging.  No injury to the finger.  He has a history of diabetes and blood sugars have been poorly controlled recently with numbers in the 300s.  He denies associated fever, chills, nausea, vomiting, diarrhea, body aches.  No recent antibiotic use.  Review of Systems  Positive: See HPI Negative: See HPI  Physical Exam  BP (!) 156/116   Pulse (!) 104   Temp 98.4 F (36.9 C) (Oral)   Resp 16   SpO2 94%  Gen:   Awake, no distress   Resp:  Normal effort  MSK:   Significant induration from the DIP joint of the third digit distally with exquisite tenderness to palpation, no fluctuance, no subungual hematoma, range of motion at the PIP joint is intact but at the DIP joint is limited secondary to swelling, mild increased warmth, no obvious erythema, 2+ radial pulse, no open wounds or signs of trauma Other:  Tachycardia with regular rhythm  Medical Decision Making  Medically screening exam initiated at 9:34 PM.  Appropriate orders placed.  Ignatius Specking was informed that the remainder of the evaluation will be completed by another provider, this initial triage assessment does not replace that evaluation, and the importance of remaining in the ED until their evaluation is complete.     Richardson Dopp 04/25/23 2136

## 2023-04-26 MED ORDER — LIDOCAINE HCL (PF) 1 % IJ SOLN
5.0000 mL | Freq: Once | INTRAMUSCULAR | Status: AC
  Administered 2023-04-26: 5 mL
  Filled 2023-04-26: qty 5

## 2023-04-26 NOTE — ED Provider Notes (Signed)
[place] EMERGENCY DEPARTMENT AT [facility] Provider Note   CSN: [id number] Arrival date & time: 04/25/23  2050     History  Chief Complaint  Patient presents with   Finger Injury    Walter Harmon is a 56 y.o. male.  57 year old male presents today for concern of paronychia to his right middle finger.  Ongoing for about a week.  Denies any inciting triggers such as recent nail trimming, biting his nails.  Denies any other complaints.  No fevers.  The history is provided by the patient. No language interpreter was used.       Home Medications Prior to Admission medications   Medication Sig Start Date End Date Taking? Authorizing Provider  allopurinol (ZYLOPRIM) 100 MG tablet Take 1 tablet (100 mg total) by mouth daily. Patient not taking: Reported on 07/05/2022 02/21/22   Julieanne Manson, MD  amLODipine-atorvastatin (CADUET) 5-40 MG tablet Take 1 tablet by mouth daily. 02/21/22   Julieanne Manson, MD  aspirin 81 MG EC tablet Take 1 tablet (81 mg total) by mouth daily. Patient not taking: Reported on 02/21/2022 01/21/20   Julieanne Manson, MD  blood glucose meter kit and supplies KIT Dispense glucometer and testing supplies based on patient and insurance preference. Use up to four times daily as directed. (FOR ICD-9 250.00, 250.01). 05/26/19   Orpah Cobb, MD  budesonide-formoterol [facility]) 160-4.5 MCG/ACT inhaler INHALE TWO PUFFS INTO THE LUNGS 2 TIMES A DAY 02/16/23   Julieanne Manson, MD  cetirizine (ZYRTEC) 10 MG tablet Take 1 tablet (10 mg total) by mouth daily. Patient not taking: Reported on 10/04/2020 06/28/20   Julieanne Manson, MD  dapagliflozin propanediol (FARXIGA) 10 MG TABS tablet TAKE 1 TABLET (10MG  TOTAL) BY MOUTH DAILY BEFORE BREAKFAST. 12/30/22   Julieanne Manson, MD  hydrALAZINE (APRESOLINE) 25 MG tablet TAKE 1 TABLET (25MG  TOTAL) BY MOUTH 2 TIMES A DAY 03/08/22   Julieanne Manson, MD  insulin aspart (NOVOLOG FLEXPEN) 100 UNIT/ML  FlexPen INJECT 0-15 UNITS INTO THE SKIN 3 TIMES A DAY WITH MEALS. 02/21/22   Julieanne Manson, MD  insulin glargine (LANTUS SOLOSTAR) 100 UNIT/ML Solostar Pen Inject 22 units SQ every morning. 02/21/22   Julieanne Manson, MD  Insulin Pen Needle (PEN NEEDLES) 30G X 8 MM MISC Inject insulin subcutaneously 4 times daily. 12/08/20   Julieanne Manson, MD  Methylsulfonylmethane (MSM PO) Take by mouth. 2 daily Patient not taking: Reported on 01/04/2021    [provider]  Multiple Vitamin (MULTIVITAMIN WITH MINERALS) TABS tablet Take 1 tablet by mouth daily. Patient not taking: Reported on 02/21/2022 05/26/19   Orpah Cobb, MD  nitroGLYCERIN (NITROSTAT) 0.4 MG SL tablet Place 1 tablet (0.4 mg total) under the tongue every 5 (five) minutes x 3 doses as needed for chest pain. Patient not taking: Reported on 01/21/2020 05/26/19   Orpah Cobb, MD  oseltamivir (TAMIFLU) 75 MG capsule Take 1 capsule (75 mg total) by mouth 2 (two) times daily. 12/20/22   Julieanne Manson, MD  pantoprazole (PROTONIX) 40 MG tablet Take 1 tablet (40 mg total) by mouth daily at 12 noon. Patient not taking: Reported on 07/05/2022 02/21/22   Julieanne Manson, MD  PROAIR HFA 108 [phone] Base) MCG/ACT inhaler INHALE TWO PUFFS BY MOUTH EVERY 6 HOURS AS NEEDED FOR WHEEZING 02/06/22   Julieanne Manson, MD  terbinafine (LAMISIL) 250 MG tablet 1/2 tab by mouth daily for 84 days. 07/05/22   Julieanne Manson, MD      Allergies    Patient has no  known allergies.    Review of Systems   Review of Systems  Constitutional:  Negative for fever.  Skin:  Positive for wound.  All other systems reviewed and are negative.   Physical Exam Updated Vital Signs BP (!) 176/125 (BP Location: Left Arm)   Pulse 83   Temp 97.8 F (36.6 C) (Oral)   Resp 18   SpO2 98%  Physical Exam Vitals and nursing note reviewed.  Constitutional:      General: He is not in acute distress.    Appearance: Normal appearance. He is not ill-appearing.   HENT:     Head: Normocephalic and atraumatic.     Nose: Nose normal.  Eyes:     Conjunctiva/sclera: Conjunctivae normal.  Pulmonary:     Effort: Pulmonary effort is normal. No respiratory distress.  Musculoskeletal:        General: No deformity.  Skin:    Findings: No rash.     Comments: Paronychia noted to right middle finger.  Without associated felon  Neurological:     Mental Status: He is alert.     ED Results / Procedures / Treatments   Labs (all labs ordered are listed, but only abnormal results are displayed) Labs Reviewed  BASIC METABOLIC PANEL - Abnormal; Notable for the following components:      Result Value   Sodium 134 (*)    CO2 21 (*)    Glucose, Bld 178 (*)    BUN 37 (*)    Creatinine, Ser 3.60 (*)    Calcium 8.8 (*)    GFR, Estimated 19 (*)    All other components within normal limits  CBC WITH DIFFERENTIAL/PLATELET  LACTIC ACID, PLASMA    EKG None  Radiology DG Hand 2 View Right  Result Date: 04/25/2023 CLINICAL DATA:  right middle finger infection EXAM: RIGHT HAND - 2 VIEW COMPARISON:  None Available. FINDINGS: No cortical erosion or destruction. There is no evidence of fracture or dislocation. There is no evidence of arthropathy or other focal bone abnormality. Soft tissues are unremarkable. IMPRESSION: 1. No radiographic findings to suggest osteomyelitis. Limited evaluation due to overlapping osseous structures and overlying soft tissues. If high clinical concern, please consider MRI for further evaluation (with intravenous contrast if GFR greater than 30). 2.  No acute displaced fracture or dislocation. Electronically Signed   By: Tish Frederickson M.D.   On: 04/25/2023 22:21    Procedures .Marland KitchenIncision and Drainage  Date/Time: 04/26/2023 4:01 AM  Performed by: Marita Kansas, PA-C Authorized by: Marita Kansas, PA-C   Consent:    Consent obtained:  Verbal   Consent given by:  Patient   Risks discussed:  Bleeding, incomplete drainage, pain, infection and  damage to other organs   Alternatives discussed:  No treatment Universal protocol:    Procedure explained and questions answered to patient or proxy's satisfaction: yes     Relevant documents present and verified: yes     Test results available : yes     Patient identity confirmed:  Verbally with patient Location:    Indications for incision and drainage: peronychia.   Location:  Upper extremity   Upper extremity location:  Finger   Finger location:  R long finger Pre-procedure details:    Skin preparation:  Povidone-iodine Sedation:    Sedation type:  None Anesthesia:    Anesthesia method:  Local infiltration   Local anesthetic:  Lidocaine 1% w/o epi Procedure type:    Complexity:  Simple Procedure details:  Incision types:  Stab incision   Drainage:  Purulent and bloody   Drainage amount:  Moderate   Wound treatment:  Wound left open   Packing materials:  None Post-procedure details:    Procedure completion:  Tolerated well, no immediate complications     Medications Ordered in ED Medications  lidocaine (PF) (XYLOCAINE) 1 % injection 5 mL (5 mLs Infiltration Given 04/26/23 0320)    ED Course/ Medical Decision Making/ A&P                             Medical Decision Making Risk Prescription drug management.   57 year old male presents for concern of paronychia to right middle finger.  Ongoing for about a week.  No systemic symptoms.  No associated felon.  Paronychia drained with single stab incision.  Wound care discussed.  Patient is appropriate for discharge.  Discharged in stable condition.  Return precautions discussed.  Patient voices understanding and is in agreement with plan.   Final Clinical Impression(s) / ED Diagnoses Final diagnoses:  Paronychia of finger of right hand    Rx / DC Orders ED Discharge Orders     None         Marita Kansas, PA-C 04/26/23 0404    Sloan Leiter, DO 04/26/23 [phone]

## 2023-04-26 NOTE — Discharge Instructions (Addendum)
Your paronychia was drained today.  If you notice concerning symptoms such as worsening swelling, fever, please return to the emergency room.  You can soak the wound 10 minutes

## 2023-07-24 ENCOUNTER — Other Ambulatory Visit: Payer: Self-pay

## 2023-07-24 ENCOUNTER — Emergency Department (HOSPITAL_COMMUNITY)
Admission: EM | Admit: 2023-07-24 | Discharge: 2023-07-25 | Disposition: A | Discharge status: Home / Self Care | Payer: Medicaid Other | Attending: Emergency Medicine | Admitting: Emergency Medicine

## 2023-07-24 ENCOUNTER — Telehealth: Payer: Self-pay | Admitting: Internal Medicine

## 2023-07-24 ENCOUNTER — Other Ambulatory Visit: Payer: Self-pay | Admitting: Internal Medicine

## 2023-07-24 ENCOUNTER — Emergency Department (HOSPITAL_COMMUNITY): Payer: Medicaid Other

## 2023-07-24 DIAGNOSIS — E1122 Type 2 diabetes mellitus with diabetic chronic kidney disease: Secondary | ICD-10-CM | POA: Insufficient documentation

## 2023-07-24 DIAGNOSIS — Z7984 Long term (current) use of oral hypoglycemic drugs: Secondary | ICD-10-CM | POA: Insufficient documentation

## 2023-07-24 DIAGNOSIS — I129 Hypertensive chronic kidney disease with stage 1 through stage 4 chronic kidney disease, or unspecified chronic kidney disease: Secondary | ICD-10-CM | POA: Insufficient documentation

## 2023-07-24 DIAGNOSIS — Z794 Long term (current) use of insulin: Secondary | ICD-10-CM | POA: Insufficient documentation

## 2023-07-24 DIAGNOSIS — M25512 Pain in left shoulder: Secondary | ICD-10-CM | POA: Insufficient documentation

## 2023-07-24 DIAGNOSIS — W07XXXA Fall from chair, initial encounter: Secondary | ICD-10-CM | POA: Insufficient documentation

## 2023-07-24 DIAGNOSIS — Z7982 Long term (current) use of aspirin: Secondary | ICD-10-CM | POA: Insufficient documentation

## 2023-07-24 DIAGNOSIS — N189 Chronic kidney disease, unspecified: Secondary | ICD-10-CM | POA: Insufficient documentation

## 2023-07-24 DIAGNOSIS — Z79899 Other long term (current) drug therapy: Secondary | ICD-10-CM | POA: Insufficient documentation

## 2023-07-24 MED ORDER — NIRMATRELVIR/RITONAVIR (PAXLOVID)TABLET: 3.0000 | ORAL_TABLET | Freq: Two times a day (BID) | ORAL | 0 refills | Status: AC

## 2023-07-24 NOTE — Telephone Encounter (Addendum)
Called patient this morning for progress report and to get him back in for follow up Historically non compliant and have not been able to clarify why with poor control of chronic health issues.   Not clear if following up with specialists as well.  He did not pick up and left message asking him to call office today for follow up.

## 2023-07-24 NOTE — Telephone Encounter (Signed)
Patient called in at 1:42 p.m. on July 27th. He was congested with cough and tested positive for COVID.   Called in Rx for Paxlovid and went over side effects.   He was to hold Statin while taking the Paxlovid.

## 2023-07-24 NOTE — ED Triage Notes (Signed)
Patient lost his balance and fell yesterday , injured his left shoulder joint with pain and mild swelling .

## 2023-07-25 MED ORDER — METHOCARBAMOL 500 MG PO TABS: 500.0000 mg | ORAL_TABLET | Freq: Two times a day (BID) | ORAL | 0 refills | Status: DC | PRN

## 2023-07-25 MED ORDER — ACETAMINOPHEN 325 MG PO TABS
650.0000 mg | ORAL_TABLET | Freq: Once | ORAL | Status: AC
  Administered 2023-07-25: 650 mg via ORAL
  Filled 2023-07-25: qty 2

## 2023-07-25 NOTE — ED Provider Notes (Signed)
[name] EMERGENCY DEPARTMENT AT [facility] Provider Note   CSN: [id number] Arrival date & time: 07/24/23  2201     History  Chief Complaint  Patient presents with   Shoulder Pain     Walter Harmon is a 57 y.o. male presents after mechanical fall yesterday.  States he slipped out of his chair, lost his balance and fell forward onto the carpet landing on his left shoulder. Differential milligram. Head trauma, LOC, nausea vomiting blurry double vision since time.  No numbness tingling or weakness in the hand.  Soreness over the anterior shoulder, worse with movement.  I reviewed his medical records.  History of type 2 diabetes, CKD with creatinine baseline around 3, hypertension.  No anticoagulation per chart review.  HPI     Home Medications Prior to Admission medications   Medication Sig Start Date End Date Taking? Authorizing Provider  methocarbamol (ROBAXIN) 500 MG tablet Take 1 tablet (500 mg total) by mouth 2 (two) times daily as needed for muscle spasms. 07/25/23  Yes , Eugene Gavia, PA-C  allopurinol (ZYLOPRIM) 100 MG tablet Take 1 tablet (100 mg total) by mouth daily. Patient not taking: Reported on 07/05/2022 02/21/22   Julieanne Manson, MD  amLODipine-atorvastatin (CADUET) 5-40 MG tablet Take 1 tablet by mouth daily. 02/21/22   Julieanne Manson, MD  aspirin 81 MG EC tablet Take 1 tablet (81 mg total) by mouth daily. Patient not taking: Reported on 02/21/2022 01/21/20   Julieanne Manson, MD  blood glucose meter kit and supplies KIT Dispense glucometer and testing supplies based on patient and insurance preference. Use up to four times daily as directed. (FOR ICD-9 250.00, 250.01). 05/26/19   Orpah Cobb, MD  budesonide-formoterol [facility]) 160-4.5 MCG/ACT inhaler INHALE TWO PUFFS INTO THE LUNGS 2 TIMES A DAY 02/16/23   Julieanne Manson, MD  cetirizine (ZYRTEC) 10 MG tablet Take 1 tablet (10 mg total) by mouth daily. Patient not taking: Reported  on 10/04/2020 06/28/20   Julieanne Manson, MD  dapagliflozin propanediol (FARXIGA) 10 MG TABS tablet TAKE 1 TABLET (10MG  TOTAL) BY MOUTH DAILY BEFORE BREAKFAST. 12/30/22   Julieanne Manson, MD  hydrALAZINE (APRESOLINE) 25 MG tablet TAKE 1 TABLET (25MG  TOTAL) BY MOUTH 2 TIMES A DAY 03/08/22   Julieanne Manson, MD  insulin aspart (NOVOLOG FLEXPEN) 100 UNIT/ML FlexPen INJECT 0-15 UNITS INTO THE SKIN 3 TIMES A DAY WITH MEALS. 02/21/22   Julieanne Manson, MD  insulin glargine (LANTUS SOLOSTAR) 100 UNIT/ML Solostar Pen Inject 22 units SQ every morning. 02/21/22   Julieanne Manson, MD  Insulin Pen Needle (PEN NEEDLES) 30G X 8 MM MISC Inject insulin subcutaneously 4 times daily. 12/08/20   Julieanne Manson, MD  Methylsulfonylmethane (MSM PO) Take by mouth. 2 daily Patient not taking: Reported on 01/04/2021    [provider]  Multiple Vitamin (MULTIVITAMIN WITH MINERALS) TABS tablet Take 1 tablet by mouth daily. Patient not taking: Reported on 02/21/2022 05/26/19   Orpah Cobb, MD  nirmatrelvir/ritonavir (PAXLOVID) 20 x 150 MG & 10 x 100MG  TABS Take 3 tablets by mouth 2 (two) times daily for 5 days. (Take nirmatrelvir 150 mg two tablets twice daily for 5 days and ritonavir 100 mg one tablet twice daily for 5 days) Patient GFR is 20 07/24/23 07/29/23  Julieanne Manson, MD  nitroGLYCERIN (NITROSTAT) 0.4 MG SL tablet Place 1 tablet (0.4 mg total) under the tongue every 5 (five) minutes x 3 doses as needed for chest pain. Patient not taking: Reported on 01/21/2020 05/26/19  Orpah Cobb, MD  pantoprazole (PROTONIX) 40 MG tablet Take 1 tablet (40 mg total) by mouth daily at 12 noon. Patient not taking: Reported on 07/05/2022 02/21/22   Julieanne Manson, MD  PROAIR HFA 108 [phone] Base) MCG/ACT inhaler INHALE TWO PUFFS BY MOUTH EVERY 6 HOURS AS NEEDED FOR WHEEZING 02/06/22   Julieanne Manson, MD  terbinafine (LAMISIL) 250 MG tablet 1/2 tab by mouth daily for 84 days. 07/05/22   Julieanne Manson, MD       Allergies    Patient has no known allergies.    Review of Systems   Review of Systems  Musculoskeletal:        Left shoulder pain.    Physical Exam Updated Vital Signs BP (!) 172/113   Pulse (!) 106   Temp 99.1 F (37.3 C)   Resp 16   SpO2 99%  Physical Exam Vitals and nursing note reviewed.  Constitutional:      Appearance: He is not ill-appearing or toxic-appearing.  HENT:     Head: Normocephalic and atraumatic.  Eyes:     General: No scleral icterus.       Right eye: No discharge.        Left eye: No discharge.     Conjunctiva/sclera: Conjunctivae normal.  Pulmonary:     Effort: Pulmonary effort is normal.  Musculoskeletal:     Comments: Tenderness palpation of the anterior shoulder without any step-off deformity or crepitus.  No bruising.  Collarbone without tenderness palpation or deformity.  Patient with normal neurovascular status in the left hand.  Normal motor function and 2+ radial pulse.  Normal sensation in the hand.  Full active range of motion of the left elbow without pain.  Patient unwilling to range the left shoulder secondary to pain.   Skin:    General: Skin is warm and dry.     Capillary Refill: Capillary refill takes less than 2 seconds.  Neurological:     General: No focal deficit present.     Mental Status: He is alert.  Psychiatric:        Mood and Affect: Mood normal.     ED Results / Procedures / Treatments   Labs (all labs ordered are listed, but only abnormal results are displayed) Labs Reviewed - No data to display  EKG None  Radiology DG Shoulder Left  Result Date: 07/24/2023 CLINICAL DATA:  Pain/injury. EXAM: LEFT SHOULDER - 2+ VIEW COMPARISON:  None Available. FINDINGS: There is no evidence of fracture or dislocation. Mild degenerative changes are noted at the acromioclavicular joint. Soft tissues are unremarkable. IMPRESSION: No acute fracture or dislocation. Electronically Signed   By: Thornell Sartorius M.D.   On: 07/24/2023  22:39    Procedures Procedures    Medications Ordered in ED Medications  acetaminophen (TYLENOL) tablet 650 mg (has no administration in time range)    ED Course/ Medical Decision Making/ A&P                                 Medical Decision Making 57 year old male presents with left shoulder pain after mechanical fall. Tachycardic intake, resolved with Tylenol evaluation.  Cardiopulmonary exam unremarkable, patient is neurovascularly intact in the hand.  Anterior left shoulder tenderness palpation without deformities.  No crepitus or skin changes.  Amount and/or Complexity of Data Reviewed Radiology: ordered.    Details: X-ray of the left shoulder negative for acute osseous abnormality, visualized by this  provider.  Risk OTC drugs. Prescription drug management.   Clinical picture most consistent with contusion of the left humerus/shoulder.  Sling offered in the emergency department.  Patient with lidocaine patch already in place.  History of CKD therefore will avoid NSAIDs.  Tylenol still playing and muscle laxer offered.  No follow-up appointment needed at this time.  Clinical concern for emergent underlying injury of the left upper and patient management is exceedingly low.  Teerrance voiced understanding of his medical evaluation and treatment plan. Each of their questions answered to their expressed satisfaction.  Return precautions were given.  Patient is well-appearing, stable, and was discharged in good condition.    Final Clinical Impression(s) / ED Diagnoses Final diagnoses:  Acute pain of left shoulder    Rx / DC Orders ED Discharge Orders          Ordered    methocarbamol (ROBAXIN) 500 MG tablet  2 times daily PRN        07/25/23 0515              , Eugene Gavia, PA-C 07/25/23 0520    Walter Octave, MD 07/25/23 0730

## 2023-07-25 NOTE — Discharge Instructions (Addendum)
You were seen in the emergency department today for your shoulder pain.  Your physical exam and vital signs are very reassuring.  You likely have a contusion to the left shoulder. This can be quite painful.  To help with your pain you may take Tylenol and / or NSAID medication (such as ibuprofen or naproxen) to help with your pain.  Additionally, you have been prescribed a muscle relaxer called Robaxin to help relieve some of the muscle spasm.  Please be advised that this medication may make you very sleepy, so you should not drive or operate heavy machinery while you are taking it.  You may also utilize topical pain relief such as Biofreeze, IcyHot, or topical lidocaine patches.  I also recommend that you apply heat to the area, such as a hot shower or heating pad, and follow heat application with massage of the muscles that are most tight.  Please return to the emergency department if you develop any numbness/tingling/weakness in your arms or legs, any difficulty urinating, or urinary incontinence chest pain, shortness of breath, abdominal pain, nausea or vomiting that does not stop, or any other new severe symptoms.

## 2023-07-29 ENCOUNTER — Telehealth: Payer: Self-pay

## 2023-07-29 NOTE — Telephone Encounter (Signed)
Patient would like an after ER appointment   Patient currently taking a muscle relaxer  Will call patient if there is a cancellation

## 2023-08-05 NOTE — Telephone Encounter (Signed)
Patient has been scheduled

## 2023-08-06 ENCOUNTER — Telehealth: Payer: Self-pay

## 2023-08-06 ENCOUNTER — Ambulatory Visit: Payer: Medicaid Other | Admitting: Internal Medicine

## 2023-08-06 NOTE — Telephone Encounter (Signed)
Patient needs an after ED appointment       Will call patient if there is a cancellation

## 2023-08-13 NOTE — Telephone Encounter (Signed)
Patient has been scheduled

## 2023-08-15 ENCOUNTER — Ambulatory Visit: Payer: Medicaid Other | Admitting: Internal Medicine

## 2023-08-22 ENCOUNTER — Encounter: Payer: Self-pay | Admitting: Internal Medicine

## 2023-08-22 ENCOUNTER — Ambulatory Visit (INDEPENDENT_AMBULATORY_CARE_PROVIDER_SITE_OTHER): Payer: Medicaid Other | Admitting: Internal Medicine

## 2023-08-22 VITALS — BP 142/98 | HR 98 | Resp 16 | Ht 64.0 in | Wt 165.0 lb

## 2023-08-22 DIAGNOSIS — M25512 Pain in left shoulder: Secondary | ICD-10-CM

## 2023-08-22 DIAGNOSIS — E782 Mixed hyperlipidemia: Secondary | ICD-10-CM | POA: Diagnosis not present

## 2023-08-22 DIAGNOSIS — Z125 Encounter for screening for malignant neoplasm of prostate: Secondary | ICD-10-CM

## 2023-08-22 DIAGNOSIS — N184 Chronic kidney disease, stage 4 (severe): Secondary | ICD-10-CM

## 2023-08-22 DIAGNOSIS — I10 Essential (primary) hypertension: Secondary | ICD-10-CM | POA: Diagnosis not present

## 2023-08-22 DIAGNOSIS — E1165 Type 2 diabetes mellitus with hyperglycemia: Secondary | ICD-10-CM

## 2023-08-22 DIAGNOSIS — Z23 Encounter for immunization: Secondary | ICD-10-CM | POA: Diagnosis not present

## 2023-08-22 DIAGNOSIS — M7502 Adhesive capsulitis of left shoulder: Secondary | ICD-10-CM

## 2023-08-22 DIAGNOSIS — Z9189 Other specified personal risk factors, not elsewhere classified: Secondary | ICD-10-CM

## 2023-08-22 MED ORDER — LANTUS SOLOSTAR 100 UNIT/ML ~~LOC~~ SOPN: PEN_INJECTOR | SUBCUTANEOUS | 11 refills | Status: DC

## 2023-08-22 MED ORDER — BUDESONIDE-FORMOTEROL FUMARATE 160-4.5 MCG/ACT IN AERO: INHALATION_SPRAY | RESPIRATORY_TRACT | 1 refills | Status: DC

## 2023-08-22 MED ORDER — LOSARTAN POTASSIUM 50 MG PO TABS: ORAL_TABLET | ORAL | 11 refills | Status: DC

## 2023-08-22 MED ORDER — AMLODIPINE-ATORVASTATIN 5-40 MG PO TABS: 1.0000 | ORAL_TABLET | Freq: Every day | ORAL | 11 refills | Status: DC

## 2023-08-22 MED ORDER — NOVOLOG FLEXPEN 100 UNIT/ML ~~LOC~~ SOPN: PEN_INJECTOR | SUBCUTANEOUS | 11 refills | Status: DC

## 2023-08-22 NOTE — Progress Notes (Unsigned)
Subjective:    Patient ID: Walter Harmon, male   DOB: 1966-03-18, 57 y.o.   MRN: [id number]   HPI  Here after over a year hiatus.   CKD4:  Has not been back to [facility] for a year per patient.  Was followed by Dr. Lowell Harmon originally, but more recently with Dr. Sabra Harmon.  Biopsy proven FSGS.  Diabetic nephropathy as well.  Not clear he has been taking meds regularly as is his history.  The last visit from 09/2020 with Dr. Marisue Harmon we have scanned in shows he should be on Losartan.  He has never had that in his med list with this EHR.   Addendum:  last visit there was 01/2021.  Was not taking his Losartan then as well.  2.  Hypertension:  states has never been out of Caduet.  Has never been on the Losartan when here which was recommended by Dr. Marisue Harmon.  Off Hydralazine for 6 months--ran out.    3.  DM:  Not taking insulin appropriately and eating a lot of pre packaged carbohydrate laden foods.  Father (non biologic) died in spring and states they have not taken the time to cook healthy meals since. Not clear when last eye exam was Walter Harmon on [address].  4.  Left shoulder pain after fall--fell from roller desk chair and landed on outstretched hand and anterior shoulder.  Xrays on 8/1 when occurred were normal.  Current Meds  Medication Sig   amLODipine-atorvastatin (CADUET) 5-40 MG tablet Take 1 tablet by mouth daily.   blood glucose meter kit and supplies KIT Dispense glucometer and testing supplies based on patient and insurance preference. Use up to four times daily as directed. (FOR ICD-9 250.00, 250.01).   budesonide-formoterol (SYMBICORT) 160-4.5 MCG/ACT inhaler INHALE TWO PUFFS INTO THE LUNGS 2 TIMES A DAY   dapagliflozin propanediol (FARXIGA) 10 MG TABS tablet TAKE 1 TABLET (10MG  TOTAL) BY MOUTH DAILY BEFORE BREAKFAST.   insulin aspart (NOVOLOG FLEXPEN) 100 UNIT/ML FlexPen INJECT 0-15 UNITS INTO THE SKIN 3 TIMES A DAY WITH MEALS.   insulin glargine (LANTUS  SOLOSTAR) 100 UNIT/ML Solostar Pen Inject 22 units SQ every morning.   Insulin Pen Needle (PEN NEEDLES) 30G X 8 MM MISC Inject insulin subcutaneously 4 times daily.   Multiple Vitamin (MULTIVITAMIN WITH MINERALS) TABS tablet Take 1 tablet by mouth daily.   PROAIR HFA 108 (90 Base) MCG/ACT inhaler INHALE TWO PUFFS BY MOUTH EVERY 6 HOURS AS NEEDED FOR WHEEZING   No Known Allergies   Review of Systems   Objective:   BP (!) 142/98 (BP Location: Left Arm, Patient Position: Sitting, Cuff Size: Normal)   Pulse 98   Resp 16   Ht 5\' 4"  (1.626 m)   Wt 165 lb (74.8 kg)   BMI 28.32 kg/m   Physical Exam NAD HEENT:  PERRL, EOMI, TMs pearly gray, throat without injection Neck:  Supple, No adenopathy Chest:  CTA CV:  RRR with normal S1, and S2, No S3, S4 or murmur.  No carotid bruit.  Carotid, Radial and DP pulses normal and equal. Abd:  S, NT, no HSM or mass, + BS LE:  No edema. Left shoulder:  unable to move more than just a degree or so in any direction.  Resists passive ROM due to pain.   Assessment & Plan   CKD4:  Remains noncompliant.  Unable to ascertain what keeps him from taking care of his health.   Referral back to Dr. Marisue Harmon.  Called [facility] pharmacy.  He has not picked up [place] since January 19th when filled a 2 month supply.  Most of meds have not been filled since October or November of 2023.  Many of these are actually free through [facility].  CBC, CMP and urine microalbumin/crea.  2.  Hypertension:  Despite his statement that he never misses Caduet, pharmacy states he has not filled since Oct 2023.  He has never been on Losartan from what can be clarified.  He has been out of hydralazine for at least 3 months per patient. Sent in Rx for all meds, including Losartan.    3.  DM:  never controlled.  Meds not filled adequately, including insulin and Farxiga.  A1C Encouraged setting up eye exam with his eye specialist.   Long discussion regarding complications of DM, hypertension,  hyperlipidemia. He still has no schedule to meals and taking meds Often takes short acting insulin and then drives to work. Went over quick options for a healthy breakfast at home within 10 minutes of insulin dosing.  4.  Frozen shoulder:  unable to adequately assess today as unable to move.  Xrays previously normal.  Referral to ortho and hopefully PT.  May need an injection.  Will need to apply for financial assistance through [facility].  5.  Hyperlipidemia:  lipid panel.  Caduet refilled.  6.  Asthma:  inhalers refilled.  7.  HM:  PSA, Shingles #1/2.  Encouraged new COVID booster and influenza vaccine clinic.  8.  Need for dental care:  dental referral--needs to update orange card.

## 2023-08-22 NOTE — Patient Instructions (Signed)
2% Fat Greek Yogurt (Fage) Sliced almonds Berries

## 2023-08-24 LAB — COMPREHENSIVE METABOLIC PANEL
ALT: 11 IU/L (ref 0–44)
AST: 11 IU/L (ref 0–40)
Albumin: 4.1 g/dL (ref 3.8–4.9)
Alkaline Phosphatase: 87 IU/L (ref 44–121)
BUN/Creatinine Ratio: 8 — ABNORMAL LOW (ref 9–20)
BUN: 27 mg/dL — ABNORMAL HIGH (ref 6–24)
Bilirubin Total: 0.5 mg/dL (ref 0.0–1.2)
CO2: 22 mmol/L (ref 20–29)
Calcium: 9.9 mg/dL (ref 8.7–10.2)
Chloride: 102 mmol/L (ref 96–106)
Creatinine, Ser: 3.41 mg/dL — ABNORMAL HIGH (ref 0.76–1.27)
Globulin, Total: 3.6 g/dL (ref 1.5–4.5)
Glucose: 164 mg/dL — ABNORMAL HIGH (ref 70–99)
Potassium: 5.2 mmol/L (ref 3.5–5.2)
Sodium: 139 mmol/L (ref 134–144)
Total Protein: 7.7 g/dL (ref 6.0–8.5)
eGFR: 20 mL/min/{1.73_m2} — ABNORMAL LOW (ref >59)

## 2023-08-24 LAB — MICROALBUMIN / CREATININE URINE RATIO
Creatinine, Urine: 109.8 mg/dL
Microalb/Creat Ratio: 1517 mg/g{creat} — ABNORMAL HIGH (ref 0–29)
Microalbumin, Urine: 1665.8 ug/mL

## 2023-08-24 LAB — LIPID PANEL W/O CHOL/HDL RATIO
Cholesterol, Total: 224 mg/dL — ABNORMAL HIGH (ref 100–199)
HDL: 40 mg/dL (ref >39)
LDL Chol Calc (NIH): 160 mg/dL — ABNORMAL HIGH (ref 0–99)
Triglycerides: 132 mg/dL (ref 0–149)
VLDL Cholesterol Cal: 24 mg/dL (ref 5–40)

## 2023-08-24 LAB — CBC WITH DIFFERENTIAL/PLATELET
Basophils Absolute: 0 10*3/uL (ref 0.0–0.2)
Basos: 0 %
EOS (ABSOLUTE): 0.1 10*3/uL (ref 0.0–0.4)
Eos: 2 %
Hematocrit: 41.8 % (ref 37.5–51.0)
Hemoglobin: 13.9 g/dL (ref 13.0–17.7)
Immature Grans (Abs): 0 10*3/uL (ref 0.0–0.1)
Immature Granulocytes: 0 %
Lymphocytes Absolute: 1.6 10*3/uL (ref 0.7–3.1)
Lymphs: 23 %
MCH: 30.2 pg (ref 26.6–33.0)
MCHC: 33.3 g/dL (ref 31.5–35.7)
MCV: 91 fL (ref 79–97)
Monocytes Absolute: 0.4 10*3/uL (ref 0.1–0.9)
Monocytes: 6 %
Neutrophils Absolute: 4.9 10*3/uL (ref 1.4–7.0)
Neutrophils: 69 %
Platelets: 240 10*3/uL (ref 150–450)
RBC: 4.61 x10E6/uL (ref 4.14–5.80)
RDW: 12.9 % (ref 11.6–15.4)
WBC: 7.1 10*3/uL (ref 3.4–10.8)

## 2023-08-24 LAB — PSA: Prostate Specific Ag, Serum: 1.8 ng/mL (ref 0.0–4.0)

## 2023-08-24 LAB — HGB A1C W/O EAG: Hgb A1c MFr Bld: 11 % — ABNORMAL HIGH (ref 4.8–5.6)

## 2023-08-26 DIAGNOSIS — M7502 Adhesive capsulitis of left shoulder: Secondary | ICD-10-CM | POA: Insufficient documentation

## 2023-09-30 ENCOUNTER — Ambulatory Visit: Payer: Medicaid Other | Admitting: Internal Medicine

## 2023-10-01 ENCOUNTER — Ambulatory Visit: Payer: Medicaid Other | Admitting: Orthopaedic Surgery

## 2023-10-14 ENCOUNTER — Telehealth: Payer: Self-pay

## 2023-10-14 NOTE — Telephone Encounter (Signed)
Patient would like to be on wait list for an appointment for  Patient has swelling on his left knee that is causing him pain. Patient has been having this for over a week. Patient has been taking tylenol.  Patient is available any day for the week of 10/20/2023

## 2023-10-16 ENCOUNTER — Ambulatory Visit: Payer: Medicaid Other | Admitting: Orthopaedic Surgery

## 2023-10-16 ENCOUNTER — Ambulatory Visit: Payer: Medicaid Other | Admitting: Sports Medicine

## 2023-10-16 ENCOUNTER — Other Ambulatory Visit: Payer: Self-pay

## 2023-10-16 ENCOUNTER — Encounter: Payer: Self-pay | Admitting: Orthopaedic Surgery

## 2023-10-16 DIAGNOSIS — G8929 Other chronic pain: Secondary | ICD-10-CM

## 2023-10-16 DIAGNOSIS — M7502 Adhesive capsulitis of left shoulder: Secondary | ICD-10-CM

## 2023-10-16 DIAGNOSIS — M25512 Pain in left shoulder: Secondary | ICD-10-CM | POA: Diagnosis not present

## 2023-10-16 MED ORDER — LIDOCAINE HCL 1 % IJ SOLN
2.0000 mL | INTRAMUSCULAR | Status: AC | PRN
Start: 2023-10-16 — End: 2023-10-16
  Administered 2023-10-16: 2 mL

## 2023-10-16 MED ORDER — METHYLPREDNISOLONE ACETATE 40 MG/ML IJ SUSP
40.0000 mg | INTRAMUSCULAR | Status: AC | PRN
Start: 2023-10-16 — End: 2023-10-16
  Administered 2023-10-16: 40 mg via INTRA_ARTICULAR

## 2023-10-16 MED ORDER — BUPIVACAINE HCL 0.25 % IJ SOLN
2.0000 mL | INTRAMUSCULAR | Status: AC | PRN
Start: 2023-10-16 — End: 2023-10-16
  Administered 2023-10-16: 2 mL via INTRA_ARTICULAR

## 2023-10-16 NOTE — Progress Notes (Signed)
Procedure Note  Patient: Walter Harmon             Date of Birth: 1966/04/10           MRN: [id number]             Visit Date: 10/16/2023  Procedures: Visit Diagnoses:  1. Chronic left shoulder pain   2. Adhesive capsulitis of left shoulder    Large Joint Inj: L glenohumeral on 10/16/2023 3:18 PM Indications: pain Details: 22 G 3.5 in needle, ultrasound-guided posterior approach Medications: 2 mL lidocaine 1 %; 2 mL bupivacaine 0.25 %; 40 mg methylPREDNISolone acetate 40 MG/ML Outcome: tolerated well, no immediate complications  US-guided glenohumeral joint injection, left shoulder After discussion on risks/benefits/indications, informed verbal consent was obtained. A timeout was then performed. The patient was positioned lying lateral recumbent on examination table. The patient's shoulder was prepped with betadine and multiple alcohol swabs and utilizing ultrasound guidance, the patient's glenohumeral joint was identified on ultrasound. Using ultrasound guidance a 22-gauge, 3.5 inch needle with a mixture of 2:2:1 cc's lidocaine:bupivicaine:depomedrol was directed from a lateral to medial direction via in-plane technique into the glenohumeral joint with visualization of appropriate spread of injectate into the joint. Patient tolerated the procedure well without immediate complications.      Procedure, treatment alternatives, risks and benefits explained, specific risks discussed. Consent was given by the patient. Immediately prior to procedure a time out was called to verify the correct patient, procedure, equipment, support staff and site/side marked as required. Patient was prepped and draped in the usual sterile fashion.    - injection and procedure was performed with Dr. Benjiman Core, patient tolerated well without immediate AE's - counseled him on transient glucose increase given CSI, he will check his sugars and adjusta s needed - follow-up with Dr. Magnus Ivan as indicated in 4 weeks;  I am happy to see them as needed  Madelyn Brunner, DO Primary Care Sports Medicine Physician  [facility]  This note was dictated using Dragon naturally speaking software and may contain errors in syntax, spelling, or content which have not been identified prior to signing this note.

## 2023-10-16 NOTE — Progress Notes (Signed)
The patient is a pleasant 57 year old gentleman who is referred to [place] due to frozen shoulder syndrome as a relates to his left shoulder.  He had a hard mechanical fall on outstretched arm back in the last week of July of this year.  After that he developed more more shoulder pain and started using his shoulder less and less.  He is a diabetic.  A few months ago his hemoglobin A1c was 11.  Recently was 8.8.  He said his blood glucose earlier today was 180.  He did check it in the office here and it is down to 140.  He did have x-rays obtained in August of his left shoulder.  I was able to review these and his x-rays look normal in terms of no fracture or malalignment.  On exam his left shoulder has significant decrease in forward flexion and abduction as well as external rotation.  His signs and symptoms are definitely consistent with a frozen shoulder.  This has been compounded probably by the fact that his diabetes has been in poor control.  At this point we need to set him up for outpatient physical therapy to work on range of motion of his left shoulder and any modalities that can help free up scar tissue.  I am also going to send him to Dr. Shon Baton for an intra-articular steroid injection in his left shoulder joint.  I counseled him about keeping his blood glucose under better control and is good that he is trending in the right direction.  He understands the steroid injection can increase his blood glucose.  I would like to see him back in about 4 weeks after course of physical therapy and this injection to see how he is making progress overall.

## 2023-10-20 ENCOUNTER — Encounter (HOSPITAL_COMMUNITY): Payer: Self-pay | Admitting: Emergency Medicine

## 2023-10-20 ENCOUNTER — Emergency Department (HOSPITAL_COMMUNITY)
Admission: EM | Admit: 2023-10-20 | Discharge: 2023-10-21 | Disposition: A | Discharge status: Home / Self Care | Payer: Medicaid Other | Attending: Emergency Medicine | Admitting: Emergency Medicine

## 2023-10-20 ENCOUNTER — Other Ambulatory Visit: Payer: Self-pay

## 2023-10-20 DIAGNOSIS — Z79899 Other long term (current) drug therapy: Secondary | ICD-10-CM | POA: Insufficient documentation

## 2023-10-20 DIAGNOSIS — I129 Hypertensive chronic kidney disease with stage 1 through stage 4 chronic kidney disease, or unspecified chronic kidney disease: Secondary | ICD-10-CM | POA: Diagnosis not present

## 2023-10-20 DIAGNOSIS — R066 Hiccough: Secondary | ICD-10-CM | POA: Diagnosis present

## 2023-10-20 DIAGNOSIS — N189 Chronic kidney disease, unspecified: Secondary | ICD-10-CM | POA: Insufficient documentation

## 2023-10-20 DIAGNOSIS — J45909 Unspecified asthma, uncomplicated: Secondary | ICD-10-CM | POA: Insufficient documentation

## 2023-10-20 DIAGNOSIS — Z7984 Long term (current) use of oral hypoglycemic drugs: Secondary | ICD-10-CM | POA: Diagnosis not present

## 2023-10-20 DIAGNOSIS — Z7982 Long term (current) use of aspirin: Secondary | ICD-10-CM | POA: Insufficient documentation

## 2023-10-20 DIAGNOSIS — Z794 Long term (current) use of insulin: Secondary | ICD-10-CM | POA: Insufficient documentation

## 2023-10-20 DIAGNOSIS — E1122 Type 2 diabetes mellitus with diabetic chronic kidney disease: Secondary | ICD-10-CM | POA: Insufficient documentation

## 2023-10-20 NOTE — ED Triage Notes (Signed)
  Patient comes in for hiccups that have been intermittent since Friday.  Patient denies any pain at this time.  Currently has no hiccups but states they come and go every few hours.  States this has happened several times before in the past.

## 2023-10-20 NOTE — Telephone Encounter (Signed)
Called to offer patient an appointment. He would like 48 hours notice.

## 2023-10-21 ENCOUNTER — Emergency Department (HOSPITAL_COMMUNITY): Payer: Medicaid Other

## 2023-10-21 MED ORDER — LIDOCAINE VISCOUS HCL 2 % MT SOLN
15.0000 mL | Freq: Once | OROMUCOSAL | Status: AC
  Administered 2023-10-21: 15 mL via ORAL
  Filled 2023-10-21: qty 15

## 2023-10-21 MED ORDER — METOCLOPRAMIDE HCL 10 MG PO TABS
10.0000 mg | ORAL_TABLET | Freq: Once | ORAL | Status: AC
  Administered 2023-10-21: 10 mg via ORAL
  Filled 2023-10-21: qty 1

## 2023-10-21 MED ORDER — ALUM & MAG HYDROXIDE-SIMETH 200-200-20 MG/5ML PO SUSP
30.0000 mL | Freq: Once | ORAL | Status: AC
  Administered 2023-10-21: 30 mL via ORAL
  Filled 2023-10-21: qty 30

## 2023-10-21 NOTE — ED Provider Notes (Signed)
[place] EMERGENCY DEPARTMENT AT [facility] Provider Note   CSN: [id number] Arrival date & time: 10/20/23  2244     History  Chief Complaint  Patient presents with   Hiccups    Walter Harmon is a 57 y.o. male.  57 year old male with history of asthma, DM, HTN, CKD presents with concern for hiccups onset Friday (4 days ago), constant. Reports similar episodes previously, states he normally comes to the ER for this and resolved with GI cocktail and reglan. Denies CP, vomiting, SHOB, abdominal pain or any other complaints or concerns tonight.        Home Medications Prior to Admission medications   Medication Sig Start Date End Date Taking? Authorizing Provider  amLODipine-atorvastatin (CADUET) 5-40 MG tablet Take 1 tablet by mouth daily. 08/22/23   Julieanne Manson, MD  aspirin 81 MG EC tablet Take 1 tablet (81 mg total) by mouth daily. Patient not taking: Reported on 02/21/2022 01/21/20   Julieanne Manson, MD  blood glucose meter kit and supplies KIT Dispense glucometer and testing supplies based on patient and insurance preference. Use up to four times daily as directed. (FOR ICD-9 250.00, 250.01). 05/26/19   Orpah Cobb, MD  budesonide-formoterol (SYMBICORT) 160-4.5 MCG/ACT inhaler INHALE TWO PUFFS INTO THE LUNGS 2 TIMES A DAY 08/22/23   Julieanne Manson, MD  dapagliflozin propanediol (FARXIGA) 10 MG TABS tablet TAKE 1 TABLET (10MG  TOTAL) BY MOUTH DAILY BEFORE BREAKFAST. 12/30/22   Julieanne Manson, MD  hydrALAZINE (APRESOLINE) 25 MG tablet TAKE 1 TABLET (25MG  TOTAL) BY MOUTH 2 TIMES A DAY Patient not taking: Reported on 08/22/2023 03/08/22   Julieanne Manson, MD  insulin aspart (NOVOLOG FLEXPEN) 100 UNIT/ML FlexPen INJECT 0-15 UNITS INTO THE SKIN 3 TIMES A DAY WITH MEALS. 08/22/23   Julieanne Manson, MD  insulin glargine (LANTUS SOLOSTAR) 100 UNIT/ML Solostar Pen Inject 22 units SQ every morning. 08/22/23   Julieanne Manson, MD  Insulin Pen Needle  (PEN NEEDLES) 30G X 8 MM MISC Inject insulin subcutaneously 4 times daily. 12/08/20   Julieanne Manson, MD  losartan (COZAAR) 50 MG tablet 1/2 tab by mouth daily 08/22/23   Julieanne Manson, MD  Multiple Vitamin (MULTIVITAMIN WITH MINERALS) TABS tablet Take 1 tablet by mouth daily. 05/26/19   Orpah Cobb, MD  PROAIR HFA 108 [phone] Base) MCG/ACT inhaler INHALE TWO PUFFS BY MOUTH EVERY 6 HOURS AS NEEDED FOR WHEEZING 02/06/22   Julieanne Manson, MD      Allergies    Patient has no known allergies.    Review of Systems   Review of Systems Negative except as per HPI Physical Exam Updated Vital Signs BP (!) 127/96 (BP Location: Left Arm)   Pulse 99   Temp 98.1 F (36.7 C) (Oral)   Resp 16   Ht 5\' 4"  (1.626 m)   Wt 74.8 kg   SpO2 100%   BMI 28.32 kg/m  Physical Exam Vitals and nursing note reviewed.  Constitutional:      General: He is not in acute distress.    Appearance: He is well-developed. He is not diaphoretic.     Comments: +hiccups  HENT:     Head: Normocephalic and atraumatic.  Cardiovascular:     Rate and Rhythm: Normal rate and regular rhythm.     Heart sounds: Normal heart sounds.  Pulmonary:     Effort: Pulmonary effort is normal.     Breath sounds: Normal breath sounds.  Abdominal:     Palpations: Abdomen is soft.  Tenderness: There is no abdominal tenderness.  Skin:    General: Skin is warm and dry.  Neurological:     Mental Status: He is alert and oriented to person, place, and time.  Psychiatric:        Behavior: Behavior normal.     ED Results / Procedures / Treatments   Labs (all labs ordered are listed, but only abnormal results are displayed) Labs Reviewed - No data to display  EKG None  Radiology DG Chest 2 View  Result Date: 10/21/2023 CLINICAL DATA:  Hiccups since 10/17/2023 EXAM: CHEST - 2 VIEW COMPARISON:  05/24/2019 FINDINGS: Normal heart size and mediastinal contours. No acute infiltrate or edema. No effusion or pneumothorax.  No acute osseous findings. IMPRESSION: Negative chest. Electronically Signed   By: Tiburcio Pea M.D.   On: 10/21/2023 04:25    Procedures Procedures    Medications Ordered in ED Medications  alum & mag hydroxide-simeth (MAALOX/MYLANTA) 200-200-20 MG/5ML suspension 30 mL (30 mLs Oral Given 10/21/23 0201)    And  lidocaine (XYLOCAINE) 2 % viscous mouth solution 15 mL (15 mLs Oral Given 10/21/23 0202)  metoCLOPramide (REGLAN) tablet 10 mg (10 mg Oral Given 10/21/23 0201)    ED Course/ Medical Decision Making/ A&P                                 Medical Decision Making Amount and/or Complexity of Data Reviewed Radiology: ordered.  Risk OTC drugs. Prescription drug management.   This patient presents to the ED for concern of hiccups, this involves an extensive number of treatment options, and is a complaint that carries with it a high risk of complications and morbidity.  The differential diagnosis includes mass, reflux, PNX   Co morbidities that complicate the patient evaluation  Asthma, hypertension, diabetes, CKD   Additional history obtained:  External records from outside source obtained and reviewed including injection in shoulder 10/16/23 under US guidance    Imaging Studies ordered:  I ordered imaging studies including CXR  I independently visualized and interpreted imaging which showed no acute process  I agree with the radiologist interpretation    Problem List / ED Course / Critical interventions / Medication management  57 year old male presents with complaint of hiccups as above.  On exam, has constant hiccuping, abdomen soft and nontender.  Did have recent injection in his shoulder.  States he has had recurrent hiccups, comes to the ER and gets treated with GI cocktail and Reglan which usually treats his symptoms.  Chest x-ray is unremarkable.  He is provided with GI cocktail and Reglan with resolution of symptoms.  Recommend recheck with PCP.  Return to  ER for worse or concerns arise. I ordered medication including gi cocktail, Reglan  for hiccups   Reevaluation of the patient after these medicines showed that the patient resolved I have reviewed the patients home medicines and have made adjustments as needed   Social Determinants of Health:  Has PCP   Test / Admission - Considered:  Hiccups resolved, stable for dc         Final Clinical Impression(s) / ED Diagnoses Final diagnoses:  Hiccups    Rx / DC Orders ED Discharge Orders     None         Jeannie Fend, PA-C 10/21/23 0443    Nira Conn, MD 10/21/23 [phone]

## 2023-10-21 NOTE — Discharge Instructions (Signed)
Follow-up with your primary care provider for recheck.  Return to the ER for worsening or concerning symptoms.

## 2023-10-22 ENCOUNTER — Encounter: Payer: Self-pay | Admitting: Internal Medicine

## 2023-10-22 ENCOUNTER — Ambulatory Visit: Payer: Medicaid Other | Admitting: Internal Medicine

## 2023-10-22 ENCOUNTER — Other Ambulatory Visit: Payer: Self-pay | Admitting: Internal Medicine

## 2023-10-22 VITALS — BP 100/64 | HR 84 | Resp 20 | Ht 64.0 in | Wt 156.0 lb

## 2023-10-22 DIAGNOSIS — R066 Hiccough: Secondary | ICD-10-CM

## 2023-10-22 MED ORDER — LANTUS SOLOSTAR 100 UNIT/ML ~~LOC~~ SOPN: PEN_INJECTOR | SUBCUTANEOUS | 11 refills | Status: DC

## 2023-10-22 MED ORDER — NOVOLOG FLEXPEN 100 UNIT/ML ~~LOC~~ SOPN: PEN_INJECTOR | SUBCUTANEOUS | 11 refills | Status: AC

## 2023-10-22 MED ORDER — BUDESONIDE-FORMOTEROL FUMARATE 160-4.5 MCG/ACT IN AERO: INHALATION_SPRAY | RESPIRATORY_TRACT | 1 refills | Status: AC

## 2023-10-22 MED ORDER — LOSARTAN POTASSIUM 50 MG PO TABS: ORAL_TABLET | ORAL | 11 refills | Status: DC

## 2023-10-22 MED ORDER — NITROGLYCERIN 0.4 MG SL SUBL: SUBLINGUAL_TABLET | SUBLINGUAL | 0 refills | Status: DC

## 2023-10-22 MED ORDER — AMLODIPINE-ATORVASTATIN 5-40 MG PO TABS: 1.0000 | ORAL_TABLET | Freq: Every day | ORAL | 11 refills | Status: DC

## 2023-10-22 MED ORDER — SUCRALFATE 1 G PO TABS: ORAL_TABLET | ORAL | 0 refills | Status: DC

## 2023-10-22 MED ORDER — PEN NEEDLES 30G X 8 MM MISC: 11 refills | Status: AC

## 2023-10-22 MED ORDER — BLOOD GLUCOSE MONITOR KIT: PACK | 0 refills | Status: AC

## 2023-10-22 MED ORDER — ASPIRIN 81 MG PO TBEC: 81.0000 mg | DELAYED_RELEASE_TABLET | Freq: Every day | ORAL | Status: AC

## 2023-10-22 MED ORDER — ALBUTEROL SULFATE HFA 108 (90 BASE) MCG/ACT IN AERS: INHALATION_SPRAY | RESPIRATORY_TRACT | 1 refills | Status: AC

## 2023-10-22 MED ORDER — PANTOPRAZOLE SODIUM 40 MG PO TBEC: DELAYED_RELEASE_TABLET | ORAL | 1 refills | Status: DC

## 2023-10-22 MED ORDER — DAPAGLIFLOZIN PROPANEDIOL 10 MG PO TABS: 10.0000 mg | ORAL_TABLET | Freq: Every day | ORAL | 11 refills | Status: DC

## 2023-10-22 NOTE — Patient Instructions (Signed)
Call progress in 2 days.

## 2023-10-22 NOTE — Telephone Encounter (Signed)
Patient was scheduled for hiccups, patient stated he would like to continue to be on wait list for an appointment for leg swelling.

## 2023-10-22 NOTE — Telephone Encounter (Signed)
Patient has been scheduled

## 2023-10-22 NOTE — Progress Notes (Signed)
Subjective:    Patient ID: Walter Harmon, male   DOB: September 27, 1966, 57 y.o.   MRN: [id number]   HPI   Hiccups started 10/17/2023, Friday: States he belched and started up.  States today, he feels like he is getting air trapped in his upper esophagus and can vomit--he did vomit yesterday, but not today.  He has had burning up into his throat since Monday, which was the reason for going into ED.  This is typical for him with these episodes.   Has had 4-5 episodes of hiccups lasting up to 7 days since 1991.  Went to ED 10/20/23 and received GI cocktail and Reglan.  When left ED on Monday or two days ago, the hiccups had stopped, but recurred as soon as he was home.  He states when he had his first episode in 1991, he ultimately was found to have a gastric ulcer and treatment resolved the issue.   No NSAIDS No soda, no caffeine, does drink decaff coffee.   Did have a [place] BLT with onions and tomatoes the day before this started.   No tobacco or alcohol No melena or hematochezia   He describes trying all the home remedies for treating hiccups without success.    2.  CKD and Htn:  states he did see his nephrologist who would like for him to take the Losartan 25 mg daily.  He has not started as he can no longer get his meds at [facility].  States he is taking everything else besides hydralazine.  Current Meds  Medication Sig   amLODipine-atorvastatin (CADUET) 5-40 MG tablet Take 1 tablet by mouth daily.   blood glucose meter kit and supplies KIT Dispense glucometer and testing supplies based on patient and insurance preference. Use up to four times daily as directed. (FOR ICD-9 250.00, 250.01).   budesonide-formoterol (SYMBICORT) 160-4.5 MCG/ACT inhaler INHALE TWO PUFFS INTO THE LUNGS 2 TIMES A DAY   dapagliflozin propanediol (FARXIGA) 10 MG TABS tablet TAKE 1 TABLET (10MG  TOTAL) BY MOUTH DAILY BEFORE BREAKFAST.   insulin aspart (NOVOLOG FLEXPEN) 100 UNIT/ML FlexPen INJECT 0-15 UNITS INTO THE  SKIN 3 TIMES A DAY WITH MEALS.   insulin glargine (LANTUS SOLOSTAR) 100 UNIT/ML Solostar Pen Inject 22 units SQ every morning.   Insulin Pen Needle (PEN NEEDLES) 30G X 8 MM MISC Inject insulin subcutaneously 4 times daily.   Multiple Vitamin (MULTIVITAMIN WITH MINERALS) TABS tablet Take 1 tablet by mouth daily.   PROAIR HFA 108 (90 Base) MCG/ACT inhaler INHALE TWO PUFFS BY MOUTH EVERY 6 HOURS AS NEEDED FOR WHEEZING   No Known Allergies   Review of Systems    Objective:   BP 100/64 (BP Location: Right Arm, Patient Position: Sitting, Cuff Size: Normal)   Pulse 84   Resp 20   Ht 5\' 4"  (1.626 m)   Wt 156 lb (70.8 kg)   BMI 26.78 kg/m   Physical Exam Heavy hiccups throughout history and exam HEENT:  PERRL, EOMI, TMs pearly gray, throat without injection Neck:  Supple, No adenopathy.  Unable to examine thyroid as could not swallow Chest:  CTA CV:  RRR without murmur or rub.  Radial and DP pulses normal and equal.   Abd:  S, Mild epigastric tenderness.  No HSM or mass, + BS LE:  No edema  Assessment & Plan   Intractable hiccups.  Was on Pantoprazole as needed, but requested to have removed from chart last visit as no longer used.  Restart and  take for 2 months.  Add Carafate 1 g slurry 4 times daily.  Nitroglycerin SL to try and see if relieves his esophageal spasm symptoms.  He is to lie down with taking as can drop his BP.  If does not help, do not use again. Call progress report in 2 days.  2.  CKD and hypertension:  removed hydralazine from med list as BP quite good without it.  Sent all meds to [facility].

## 2023-10-24 ENCOUNTER — Other Ambulatory Visit: Payer: Self-pay

## 2023-10-24 MED ORDER — ATORVASTATIN CALCIUM 40 MG PO TABS: 40.0000 mg | ORAL_TABLET | Freq: Every day | ORAL | 11 refills | Status: AC

## 2023-10-24 MED ORDER — AMLODIPINE BESYLATE 5 MG PO TABS: 5.0000 mg | ORAL_TABLET | Freq: Every day | ORAL | 11 refills | Status: AC

## 2023-10-26 ENCOUNTER — Other Ambulatory Visit: Payer: Self-pay

## 2023-10-26 ENCOUNTER — Encounter (HOSPITAL_COMMUNITY): Payer: Self-pay | Admitting: Emergency Medicine

## 2023-10-26 ENCOUNTER — Emergency Department (HOSPITAL_COMMUNITY)
Admission: EM | Admit: 2023-10-26 | Discharge: 2023-10-27 | Disposition: A | Discharge status: Home / Self Care | Payer: Medicaid Other | Attending: Emergency Medicine | Admitting: Emergency Medicine

## 2023-10-26 DIAGNOSIS — N1832 Chronic kidney disease, stage 3b: Secondary | ICD-10-CM | POA: Diagnosis not present

## 2023-10-26 DIAGNOSIS — J45909 Unspecified asthma, uncomplicated: Secondary | ICD-10-CM | POA: Insufficient documentation

## 2023-10-26 DIAGNOSIS — E1122 Type 2 diabetes mellitus with diabetic chronic kidney disease: Secondary | ICD-10-CM | POA: Diagnosis not present

## 2023-10-26 DIAGNOSIS — Z794 Long term (current) use of insulin: Secondary | ICD-10-CM | POA: Insufficient documentation

## 2023-10-26 DIAGNOSIS — Z7982 Long term (current) use of aspirin: Secondary | ICD-10-CM | POA: Diagnosis not present

## 2023-10-26 DIAGNOSIS — Z79899 Other long term (current) drug therapy: Secondary | ICD-10-CM | POA: Insufficient documentation

## 2023-10-26 DIAGNOSIS — R066 Hiccough: Secondary | ICD-10-CM | POA: Diagnosis present

## 2023-10-26 DIAGNOSIS — R109 Unspecified abdominal pain: Secondary | ICD-10-CM | POA: Diagnosis not present

## 2023-10-26 NOTE — ED Triage Notes (Signed)
Pt in with intractable hiccups, ongoing intermittently x 9 days. Hx of same, states GI cocktail usually resolves them. Recently started Reglan and Protonix, but no relief. Denies cp

## 2023-10-27 ENCOUNTER — Telehealth: Payer: Self-pay

## 2023-10-27 ENCOUNTER — Emergency Department (HOSPITAL_COMMUNITY): Payer: Medicaid Other

## 2023-10-27 LAB — COMPREHENSIVE METABOLIC PANEL
ALT: 16 U/L (ref 0–44)
AST: 15 U/L (ref 15–41)
Albumin: 3.7 g/dL (ref 3.5–5.0)
Alkaline Phosphatase: 65 U/L (ref 38–126)
Anion gap: 8 (ref 5–15)
BUN: 35 mg/dL — ABNORMAL HIGH (ref 6–20)
CO2: 23 mmol/L (ref 22–32)
Calcium: 8.8 mg/dL — ABNORMAL LOW (ref 8.9–10.3)
Chloride: 107 mmol/L (ref 98–111)
Creatinine, Ser: 4.48 mg/dL — ABNORMAL HIGH (ref 0.61–1.24)
GFR, Estimated: 15 mL/min — ABNORMAL LOW (ref >60)
Glucose, Bld: 127 mg/dL — ABNORMAL HIGH (ref 70–99)
Potassium: 4.7 mmol/L (ref 3.5–5.1)
Sodium: 138 mmol/L (ref 135–145)
Total Bilirubin: 0.6 mg/dL (ref <1.2)
Total Protein: 7.2 g/dL (ref 6.5–8.1)

## 2023-10-27 LAB — CBC WITH DIFFERENTIAL/PLATELET
Abs Immature Granulocytes: 0.02 10*3/uL (ref 0.00–0.07)
Basophils Absolute: 0.1 10*3/uL (ref 0.0–0.1)
Basophils Relative: 1 %
Eosinophils Absolute: 0.3 10*3/uL (ref 0.0–0.5)
Eosinophils Relative: 4 %
HCT: 43.3 % (ref 39.0–52.0)
Hemoglobin: 14 g/dL (ref 13.0–17.0)
Immature Granulocytes: 0 %
Lymphocytes Relative: 25 %
Lymphs Abs: 2 10*3/uL (ref 0.7–4.0)
MCH: 31 pg (ref 26.0–34.0)
MCHC: 32.3 g/dL (ref 30.0–36.0)
MCV: 95.8 fL (ref 80.0–100.0)
Monocytes Absolute: 0.6 10*3/uL (ref 0.1–1.0)
Monocytes Relative: 8 %
Neutro Abs: 5 10*3/uL (ref 1.7–7.7)
Neutrophils Relative %: 62 %
Platelets: 219 10*3/uL (ref 150–400)
RBC: 4.52 MIL/uL (ref 4.22–5.81)
RDW: 13.2 % (ref 11.5–15.5)
WBC: 8 10*3/uL (ref 4.0–10.5)
nRBC: 0 % (ref 0.0–0.2)

## 2023-10-27 MED ORDER — LIDOCAINE VISCOUS HCL 2 % MT SOLN
15.0000 mL | Freq: Once | OROMUCOSAL | Status: AC
  Administered 2023-10-27: 15 mL via ORAL
  Filled 2023-10-27: qty 15

## 2023-10-27 MED ORDER — ALUM & MAG HYDROXIDE-SIMETH 200-200-20 MG/5ML PO SUSP
30.0000 mL | Freq: Once | ORAL | Status: AC
  Administered 2023-10-27: 30 mL via ORAL
  Filled 2023-10-27: qty 30

## 2023-10-27 MED ORDER — METOCLOPRAMIDE HCL 5 MG/ML IJ SOLN
10.0000 mg | Freq: Once | INTRAMUSCULAR | Status: AC
  Administered 2023-10-27: 10 mg via INTRAVENOUS
  Filled 2023-10-27: qty 2

## 2023-10-27 NOTE — Telephone Encounter (Signed)
Patient called to inform that he went to the ER , Hiccups never went away, patient stated the medication he was prescribed did not help and would like to know if Doctor can prescribed a new medication.   Patient would also like to be on wait list for a after ER appointment .  We will call patient if there is a cancellation.

## 2023-10-27 NOTE — ED Provider Notes (Signed)
[place] EMERGENCY DEPARTMENT AT [facility] Provider Note   CSN: [id number] Arrival date & time: 10/26/23  2218     History  Chief Complaint  Patient presents with   Hiccups    Walter Harmon is a 57 y.o. male.  Patient with past medical history significant for type II DM, stage III CKD, asthma, intractable hiccups presents to the emergency room complaining of hiccups.  He reports intermittent hiccups for the past 9 days.  The patient states the GI cocktail at the emergency department normally resolves them.  He received GI cocktail in the emergency department on October 28.  He also saw family medicine for the same on October 30.  He complains of some vague left-sided abdominal pain associated with hiccups.  He denies nausea, vomiting, chest pain, shortness of breath, fever, urinary symptoms.  HPI     Home Medications Prior to Admission medications   Medication Sig Start Date End Date Taking? Authorizing Provider  albuterol (PROAIR HFA) 108 (90 Base) MCG/ACT inhaler INHALE TWO PUFFS BY MOUTH EVERY 6 HOURS AS NEEDED FOR WHEEZING 10/22/23   Julieanne Manson, MD  amLODipine (NORVASC) 5 MG tablet Take 1 tablet (5 mg total) by mouth daily. 10/24/23   Julieanne Manson, MD  aspirin EC 81 MG tablet Take 1 tablet (81 mg total) by mouth daily. 10/22/23   Julieanne Manson, MD  atorvastatin (LIPITOR) 40 MG tablet Take 1 tablet (40 mg total) by mouth daily. 10/24/23   Julieanne Manson, MD  blood glucose meter kit and supplies KIT Dispense glucometer and testing supplies based on patient and insurance preference. Use up to four times daily as directed. (FOR ICD-9 250.00, 250.01). 10/22/23   Julieanne Manson, MD  budesonide-formoterol [facility]) 160-4.5 MCG/ACT inhaler INHALE TWO PUFFS INTO THE LUNGS 2 TIMES A DAY 10/22/23   Julieanne Manson, MD  dapagliflozin propanediol (FARXIGA) 10 MG TABS tablet Take 1 tablet (10 mg total) by mouth daily. 10/22/23   Julieanne Manson, MD  insulin aspart (NOVOLOG FLEXPEN) 100 UNIT/ML FlexPen INJECT 0-6 UNITS subcutaneously 3 TIMES A DAY 10 minutes before meals 10/22/23   Julieanne Manson, MD  insulin glargine (LANTUS SOLOSTAR) 100 UNIT/ML Solostar Pen Inject 22 units SQ every morning. 10/22/23   Julieanne Manson, MD  Insulin Pen Needle (PEN NEEDLES) 30G X 8 MM MISC Inject insulin subcutaneously 4 times daily. 10/22/23   Julieanne Manson, MD  Multiple Vitamin (MULTIVITAMIN WITH MINERALS) TABS tablet Take 1 tablet by mouth daily. 05/26/19   Orpah Cobb, MD  nitroGLYCERIN (NITROSTAT) 0.4 MG SL tablet 1 tab under tongue once daily as needed for esophageal spasm. 10/22/23   Julieanne Manson, MD  pantoprazole (PROTONIX) 40 MG tablet 1 tab by mouth on empty stomach 30 minutes before breakfast and other meds. 10/22/23   Julieanne Manson, MD  sucralfate (CARAFATE) 1 g tablet DISSOLVE 1 TABLET IN 30 ml  OF WATER TO MAKE SLURRY AND SWALLOW BEFORE MEALS AND AT BEDTIME 4 times daily. DO NOT TAKE WITH OTHER ORAL MEDS 10/24/23   Julieanne Manson, MD      Allergies    Patient has no known allergies.    Review of Systems   Review of Systems  Physical Exam Updated Vital Signs BP (!) 134/97 (BP Location: Left Arm)   Pulse 80   Temp 98.3 F (36.8 C) (Oral)   Resp 16   Wt 70.8 kg   SpO2 100%   BMI 26.78 kg/m  Physical Exam Vitals and nursing note reviewed.  HENT:  Head: Normocephalic and atraumatic.     Mouth/Throat:     Mouth: Mucous membranes are moist.     Pharynx: Oropharynx is clear.  Eyes:     Conjunctiva/sclera: Conjunctivae normal.  Cardiovascular:     Rate and Rhythm: Normal rate and regular rhythm.  Pulmonary:     Effort: Pulmonary effort is normal. No respiratory distress.  Abdominal:     Palpations: Abdomen is soft.     Tenderness: There is no abdominal tenderness.  Musculoskeletal:        General: No signs of injury.     Cervical back: Normal range of motion.  Skin:    General:  Skin is dry.  Neurological:     Mental Status: He is alert.  Psychiatric:        Speech: Speech normal.        Behavior: Behavior normal.     ED Results / Procedures / Treatments   Labs (all labs ordered are listed, but only abnormal results are displayed) Labs Reviewed  COMPREHENSIVE METABOLIC PANEL - Abnormal; Notable for the following components:      Result Value   Glucose, Bld 127 (*)    BUN 35 (*)    Creatinine, Ser 4.48 (*)    Calcium 8.8 (*)    GFR, Estimated 15 (*)    All other components within normal limits  CBC WITH DIFFERENTIAL/PLATELET    EKG None  Radiology CT ABDOMEN PELVIS WO CONTRAST  Result Date: 10/27/2023 CLINICAL DATA:  Acute nonlocalized abdominal pain EXAM: CT ABDOMEN AND PELVIS WITHOUT CONTRAST TECHNIQUE: Multidetector CT imaging of the abdomen and pelvis was performed following the standard protocol without IV contrast. RADIATION DOSE REDUCTION: This exam was performed according to the departmental dose-optimization program which includes automated exposure control, adjustment of the mA and/or kV according to patient size and/or use of iterative reconstruction technique. COMPARISON:  04/29/2019 FINDINGS: Lower chest: There is bronchial wall thickening noted within the visualized lung bases in keeping with airway inflammation. Subtle ground-glass centrilobular nodularity is not well characterized on this examination but may reflect changes of atypical infection, inhalational injury, or smoking related lung disease. Cardiac size within normal limits. Hepatobiliary: No focal liver abnormality is seen. No gallstones, gallbladder wall thickening, or biliary dilatation. Pancreas: There is progressive enlargement of a 2.1 x 2.2 cm low-attenuation lesion within the distal body of the pancreas, best seen on axial image # 23/3, not well characterized on this noncontrast examination. No superimposed peripancreatic acute inflammatory change. The pancreatic duct is not  dilated. Spleen: Normal in size without focal abnormality. Adrenals/Urinary Tract: The adrenal glands are unremarkable. The kidneys are normal in size and position. No intrarenal or ureteral calculi. No hydronephrosis. No perinephric inflammatory stranding. The bladder demonstrates a small diverticulum anteriorly, best seen on image # 62/3, in keeping with changes of chronic bladder outlet obstruction. The bladder is not distended. Stomach/Bowel: Mild 2 moderate sigmoid diverticulosis. Moderate colonic stool burden without evidence of obstruction. The stomach, small bowel, and large bowel are otherwise unremarkable. The appendix is normal. No free intraperitoneal gas or fluid. Vascular/Lymphatic: Minimal aortoiliac atherosclerotic calcification. The abdominal vasculature is otherwise unremarkable on this noncontrast examination. No pathologic adenopathy within the abdomen and pelvis. Reproductive: The prostate gland is markedly enlarged. Other: Small fat containing left inguinal hernia. Right inguinal cord lipoma. Musculoskeletal: No acute bone abnormality. No lytic or blastic bone lesion. IMPRESSION: 1. No acute intra-abdominal pathology identified. 2. Progressive enlargement of a 2.1 x 2.2 cm low-attenuation lesion within  the distal body of the pancreas, not well characterized on this noncontrast examination. Dedicated pancreatic protocol CT or MRI examination is recommended for further characterization. 3. Marked prostatomegaly. 4. Mild to moderate sigmoid diverticulosis without superimposed acute inflammatory change. 5. Moderate colonic stool burden without evidence of obstruction. 6. Bronchial wall thickening within the visualized lung bases in keeping with airway inflammation. Subtle ground-glass centrilobular nodularity is not well characterized on this examination but may reflect changes of atypical infection, inhalational injury, or smoking related lung disease. If indicated, this could be better assessed  with dedicated CT imaging of the chest. Aortic Atherosclerosis (ICD10-I70.0). Electronically Signed   By: Helyn Numbers M.D.   On: 10/27/2023 01:43    Procedures Procedures    Medications Ordered in ED Medications  alum & mag hydroxide-simeth (MAALOX/MYLANTA) 200-200-20 MG/5ML suspension 30 mL (30 mLs Oral Given 10/27/23 0232)    And  lidocaine (XYLOCAINE) 2 % viscous mouth solution 15 mL (15 mLs Oral Given 10/27/23 0232)  metoCLOPramide (REGLAN) injection 10 mg (10 mg Intravenous Given 10/27/23 0234)    ED Course/ Medical Decision Making/ A&P                                 Medical Decision Making Amount and/or Complexity of Data Reviewed Labs: ordered. Radiology: ordered.  Risk OTC drugs. Prescription drug management.   This patient presents to the ED for concern of hiccups, this involves an extensive number of treatment options, and is a complaint that carries with it a high risk of complications and morbidity.  The differential diagnosis includes infection, pharyngitis, IBS, pancreatitis, others   Co morbidities that complicate the patient evaluation  CKD stage IIIb, type II DM, intractable hiccups   Additional history obtained:   External records from outside source obtained and reviewed including family medicine notes from October 31.  History of hiccups going back to 1991 with periods of hiccups lasting 7 days or so at a time   Lab Tests:  I Ordered, and personally interpreted labs.  The pertinent results include: Creatinine 4.48 (most recent outpatient testing 3.41), unremarkable CBC   Imaging Studies ordered:  I ordered imaging studies including CT abdomen pelvis without contrast I independently visualized and interpreted imaging which showed  1. No acute intra-abdominal pathology identified.  2. Progressive enlargement of a 2.1 x 2.2 cm low-attenuation lesion  within the distal body of the pancreas, not well characterized on  this noncontrast examination.  Dedicated pancreatic protocol CT or  MRI examination is recommended for further characterization.  3. Marked prostatomegaly.  4. Mild to moderate sigmoid diverticulosis without superimposed  acute inflammatory change.  5. Moderate colonic stool burden without evidence of obstruction.  6. Bronchial wall thickening within the visualized lung bases in  keeping with airway inflammation   Subtle ground-glass centrilobular  nodularity is not well characterized on this examination but may  reflect changes of atypical infection, inhalational injury, or  smoking related lung disease.   I agree with the radiologist interpretation    Problem List / ED Course / Critical interventions / Medication management   I ordered medication including GI cocktail, Reglan for hiccups Reevaluation of the patient after these medicines showed that the patient improved I have reviewed the patients home medicines and have made adjustments as needed   Test / Admission - Considered:  Patient with no emergent cause of hiccups noted on workup.  Patient with no shortness of  breath.  CT showed signs of bronchial wall thickening, subtle groundglass centrilobular nodularity.  Patient with no shortness of breath, no cough.  No leukocytosis.  No signs of atypical infection or inhalational injury.  Patient is not a current smoker.  No indication for emergent CT of the chest.  CT also showed an enlargement of a lesion of the distal body of the pancreas.  Recommendation for nonemergent pancreatic CT or MRI.  Will recommend patient follow-up with primary care to arrange the scans.  Patient resting comfortably after medications.  With history of current intermittent hiccups dating back to 1991 doubt any emergent cause at this time.  Plan to discharge home with recommendations for follow-up with primary care.  Patient with kidney function decreased from baseline not meeting definition of AKI.  Discussed with pharmacy who recommends  patient discontinue losartan due to potential nephrotoxic effect         Final Clinical Impression(s) / ED Diagnoses Final diagnoses:  Hiccups    Rx / DC Orders ED Discharge Orders     None         Pamala Duffel 10/27/23 0408    Tilden Fossa, MD 10/27/23 [phone]

## 2023-10-27 NOTE — Discharge Instructions (Addendum)
You were seen tonight for hiccups. Please continue to follow up with your primary care provider. There were some results on imaging that should be followed up with further outpatient imaging, either a CT pancreas or MRI. Your primary care provider can help arrange this. I have attached the impression from the CT scan.  Please stop taking losartan for now due to worsening kidney function. Please speak to your primary team and nephrology to determine if you should resume this medication or be started on an alternative.  If you develop any life threatening symptoms please return to the emergency department.   1. No acute intra-abdominal pathology identified.  2. Progressive enlargement of a 2.1 x 2.2 cm low-attenuation lesion  within the distal body of the pancreas, not well characterized on  this noncontrast examination. Dedicated pancreatic protocol CT or  MRI examination is recommended for further characterization.  3. Marked prostatomegaly.  4. Mild to moderate sigmoid diverticulosis without superimposed  acute inflammatory change.  5. Moderate colonic stool burden without evidence of obstruction.  6. Bronchial wall thickening within the visualized lung bases in  keeping with airway inflammation. Subtle ground-glass centrilobular  nodularity is not well characterized on this examination but may  reflect changes of atypical infection, inhalational injury, or  smoking related lung disease. If indicated, this could be better  assessed with dedicated CT imaging of the chest.    Aortic Atherosclerosis

## 2023-10-28 MED ORDER — BACLOFEN 5 MG PO TABS: ORAL_TABLET | ORAL | 1 refills | Status: DC

## 2023-10-28 NOTE — Telephone Encounter (Signed)
Went to [facility] ED Sunday, 10/27/2023 and had CT scan of abdomen showing mass in distal body of pancreas 2.1 x 2.3 cm, but states it is enlarging.  I do not see another radiologic study in his chart that shows this previously. Given GI cocktail and IV reglan and ultimately stopped the hiccups.    Hiccups continue to come and go since Monday morning. Does not really want oral Reglan as makes him drowsy.   Willing to try Baclofen 5-10 mg every 8 hours as needed.   Will try to get hold of [facility] tomorrow to get more info on pancreatic lesion.

## 2023-11-07 ENCOUNTER — Other Ambulatory Visit: Payer: Self-pay

## 2023-11-07 ENCOUNTER — Ambulatory Visit: Payer: Medicaid Other | Attending: Orthopaedic Surgery

## 2023-11-07 DIAGNOSIS — M25612 Stiffness of left shoulder, not elsewhere classified: Secondary | ICD-10-CM | POA: Insufficient documentation

## 2023-11-07 DIAGNOSIS — M25512 Pain in left shoulder: Secondary | ICD-10-CM | POA: Diagnosis present

## 2023-11-07 DIAGNOSIS — M7502 Adhesive capsulitis of left shoulder: Secondary | ICD-10-CM | POA: Insufficient documentation

## 2023-11-07 DIAGNOSIS — G8929 Other chronic pain: Secondary | ICD-10-CM | POA: Diagnosis present
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
## 2023-11-17 ENCOUNTER — Ambulatory Visit (INDEPENDENT_AMBULATORY_CARE_PROVIDER_SITE_OTHER): Payer: Medicaid Other | Admitting: Orthopaedic Surgery

## 2023-11-17 ENCOUNTER — Encounter: Payer: Self-pay | Admitting: Orthopaedic Surgery

## 2023-11-17 DIAGNOSIS — M7502 Adhesive capsulitis of left shoulder: Secondary | ICD-10-CM

## 2023-11-17 DIAGNOSIS — M25512 Pain in left shoulder: Secondary | ICD-10-CM | POA: Diagnosis not present

## 2023-11-17 DIAGNOSIS — G8929 Other chronic pain: Secondary | ICD-10-CM

## 2023-11-17 NOTE — Progress Notes (Signed)
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  When we saw him a month ago we sent him to my partner Dr. Shon Baton for an intra-articular steroid injection in his left shoulder.  He says he is not 100% but the injection was helpful.  He is a diabetic and has not been under great control but he reports that the injection did not significantly increase his blood glucose.  He said he was 130 this morning.  He did start physical therapy just last week and is only had 1 physical therapy session for his left shoulder.  On my exam there is still shoulder stiffness with forward flexion as well as abduction and rotation.  We will have him go through physical therapy for the next 6 weeks on that left shoulder.  If he does not make improvements with motion he can certainly recommend a left shoulder arthroscopy for lysis of adhesions and manipulation under anesthesia.  We will see though what he looks like in 6 weeks.  He agrees with this treatment plan.

## 2023-11-24 ENCOUNTER — Other Ambulatory Visit: Payer: Medicaid Other

## 2023-11-24 DIAGNOSIS — N184 Chronic kidney disease, stage 4 (severe): Secondary | ICD-10-CM

## 2023-11-24 DIAGNOSIS — E1165 Type 2 diabetes mellitus with hyperglycemia: Secondary | ICD-10-CM | POA: Diagnosis not present

## 2023-11-25 LAB — CBC WITH DIFFERENTIAL/PLATELET
Basophils Absolute: 0 10*3/uL (ref 0.0–0.2)
Basos: 0 %
EOS (ABSOLUTE): 0.4 10*3/uL (ref 0.0–0.4)
Eos: 6 %
Hematocrit: 44 % (ref 37.5–51.0)
Hemoglobin: 14.3 g/dL (ref 13.0–17.7)
Immature Grans (Abs): 0 10*3/uL (ref 0.0–0.1)
Immature Granulocytes: 0 %
Lymphocytes Absolute: 1.6 10*3/uL (ref 0.7–3.1)
Lymphs: 24 %
MCH: 30.8 pg (ref 26.6–33.0)
MCHC: 32.5 g/dL (ref 31.5–35.7)
MCV: 95 fL (ref 79–97)
Monocytes Absolute: 0.5 10*3/uL (ref 0.1–0.9)
Monocytes: 8 %
Neutrophils Absolute: 4.1 10*3/uL (ref 1.4–7.0)
Neutrophils: 62 %
Platelets: 243 10*3/uL (ref 150–450)
RBC: 4.65 x10E6/uL (ref 4.14–5.80)
RDW: 12.8 % (ref 11.6–15.4)
WBC: 6.6 10*3/uL (ref 3.4–10.8)

## 2023-11-25 LAB — COMPREHENSIVE METABOLIC PANEL
ALT: 18 [IU]/L (ref 0–44)
AST: 17 [IU]/L (ref 0–40)
Albumin: 4 g/dL (ref 3.8–4.9)
Alkaline Phosphatase: 89 [IU]/L (ref 44–121)
BUN/Creatinine Ratio: 10 (ref 9–20)
BUN: 37 mg/dL — ABNORMAL HIGH (ref 6–24)
Bilirubin Total: <0.2 mg/dL (ref 0.0–1.2)
CO2: 20 mmol/L (ref 20–29)
Calcium: 9.5 mg/dL (ref 8.7–10.2)
Chloride: 105 mmol/L (ref 96–106)
Creatinine, Ser: 3.83 mg/dL — ABNORMAL HIGH (ref 0.76–1.27)
Globulin, Total: 3.4 g/dL (ref 1.5–4.5)
Glucose: 181 mg/dL — ABNORMAL HIGH (ref 70–99)
Potassium: 4.7 mmol/L (ref 3.5–5.2)
Sodium: 139 mmol/L (ref 134–144)
Total Protein: 7.4 g/dL (ref 6.0–8.5)
eGFR: 18 mL/min/{1.73_m2} — ABNORMAL LOW (ref >59)

## 2023-11-25 LAB — HEMOGLOBIN A1C
Est. average glucose Bld gHb Est-mCnc: 194 mg/dL
Hgb A1c MFr Bld: 8.4 % — ABNORMAL HIGH (ref 4.8–5.6)

## 2023-11-26 ENCOUNTER — Ambulatory Visit: Payer: Medicaid Other | Admitting: Internal Medicine

## 2023-11-26 ENCOUNTER — Encounter: Payer: Self-pay | Admitting: Internal Medicine

## 2023-11-26 VITALS — BP 138/88 | HR 106 | Resp 16 | Ht 64.0 in | Wt 161.0 lb

## 2023-11-26 DIAGNOSIS — N138 Other obstructive and reflux uropathy: Secondary | ICD-10-CM

## 2023-11-26 DIAGNOSIS — I10 Essential (primary) hypertension: Secondary | ICD-10-CM | POA: Diagnosis not present

## 2023-11-26 DIAGNOSIS — M25562 Pain in left knee: Secondary | ICD-10-CM

## 2023-11-26 DIAGNOSIS — E1165 Type 2 diabetes mellitus with hyperglycemia: Secondary | ICD-10-CM | POA: Diagnosis not present

## 2023-11-26 DIAGNOSIS — Z23 Encounter for immunization: Secondary | ICD-10-CM | POA: Diagnosis not present

## 2023-11-26 DIAGNOSIS — N184 Chronic kidney disease, stage 4 (severe): Secondary | ICD-10-CM

## 2023-11-26 DIAGNOSIS — M25462 Effusion, left knee: Secondary | ICD-10-CM

## 2023-11-26 DIAGNOSIS — N401 Enlarged prostate with lower urinary tract symptoms: Secondary | ICD-10-CM

## 2023-11-26 DIAGNOSIS — N3941 Urge incontinence: Secondary | ICD-10-CM

## 2023-11-26 MED ORDER — DUTASTERIDE 0.5 MG PO CAPS: 0.5000 mg | ORAL_CAPSULE | Freq: Every day | ORAL | 11 refills | Status: DC

## 2023-11-26 MED ORDER — TAMSULOSIN HCL 0.4 MG PO CAPS: ORAL_CAPSULE | ORAL | 11 refills | Status: AC

## 2023-11-26 NOTE — Progress Notes (Signed)
Subjective:    Patient ID: Walter Harmon, male   DOB: 1966-07-20, 57 y.o.   MRN: [id number]   HPI   Hiccups finally resolved after two weeks.  He feels the combination of Baclofen and Pantoprazole took care of it finally.    2.  DM:  A1C down to 8.4% from 11.0%.  He is finally willing to consider Ozempic.  He states he is more regimented with meals and taking meds, which is where he runs into problems with sugars the most.  He is using both novolog and lantus.  He is taking [place] regularly now and creatinine is slightly improved.   3.  CKD:  Saw Dr. Marisue Humble this morning.  Has discontinued Losartan.  He is taking amlodipine and Atorvastatin still as caduet, but will run out soon and switch to separate meds as insurance will no longer cover the combo.    4.  Hypertension:  doing better remembering meds.    5.  HM:  Has not had his influenza vaccine today.  6.  Left knee swelling:  Started in October, 1 week before hiccups.  No history of injury.  States was just sitting watching TV and stood up, noting his knee hurting.  Current Meds  Medication Sig   albuterol (PROAIR HFA) 108 (90 Base) MCG/ACT inhaler INHALE TWO PUFFS BY MOUTH EVERY 6 HOURS AS NEEDED FOR WHEEZING   amLODipine-atorvastatin (CADUET) 5-40 MG tablet Take 1 tablet by mouth daily.   aspirin EC 81 MG tablet Take 1 tablet (81 mg total) by mouth daily.   blood glucose meter kit and supplies KIT Dispense glucometer and testing supplies based on patient and insurance preference. Use up to four times daily as directed. (FOR ICD-9 250.00, 250.01).   budesonide-formoterol (SYMBICORT) 160-4.5 MCG/ACT inhaler INHALE TWO PUFFS INTO THE LUNGS 2 TIMES A DAY   dapagliflozin propanediol (FARXIGA) 10 MG TABS tablet Take 1 tablet (10 mg total) by mouth daily.   insulin aspart (NOVOLOG FLEXPEN) 100 UNIT/ML FlexPen INJECT 0-6 UNITS subcutaneously 3 TIMES A DAY 10 minutes before meals   insulin glargine (LANTUS SOLOSTAR) 100 UNIT/ML  Solostar Pen Inject 22 units SQ every morning.   Insulin Pen Needle (PEN NEEDLES) 30G X 8 MM MISC Inject insulin subcutaneously 4 times daily.   Multiple Vitamin (MULTIVITAMIN WITH MINERALS) TABS tablet Take 1 tablet by mouth daily.   pantoprazole (PROTONIX) 40 MG tablet 1 tab by mouth on empty stomach 30 minutes before breakfast and other meds.   Semaglutide (OZEMPIC, 0.25 OR 0.5 MG/DOSE, [place]) Inject 0.25 mg into the skin once a week. Has not yet started   No Known Allergies   Review of Systems    Objective:   BP 138/88 (BP Location: Left Arm, Patient Position: Sitting, Cuff Size: Normal)   Pulse (!) 106   Resp 16   Ht 5\' 4"  (1.626 m)   Wt 161 lb (73 kg)   BMI 27.64 kg/m   Physical Exam Lungs:  CTA CV:  RRR without murmur or rub.  No carotid bruits.  Carotid, radial and DP pulses normal and equal Abd:  S, NT, No HSM or mass, + BS LE:  no soft tissue edema. Full ROM of left knee though tight on full flexion.  Effusion palpable above patella.  NT over joint lines.   No ligamentous laxity or pain on stress--collaterals and cruciates.  Good extension and flexion against opposing force.  Gait normal  Assessment & Plan   Hiccups:  resolved  2.   DM:  improving.  Still skipping meals and meds.  Have discussed a very specific regular routine with meals and taking meds with some success in improving control recently.  Ozempic 0.25 mg subcutaneously once weekly.  3.  CKD:  back to seeing nephrology/Dr. Marisue Humble.  Continue to keep DM and hypertension under better control  4.  Hypertension:  fair control.    5.  Left knee pain and effusion:  ESR and Xrays of knee and followed by Dr. Magnus Ivan, ortho.  :    6.  BPH:  Oh, by the way:  for years having weak urine stream and urinary urge with incontinence.  Wears incontinence underwear. Would like urology referral.  Had CT in ED for hiccups showing markedly enlarged prostate.  Will start on Tamsulosin and dutasteride.  Urology referral

## 2023-11-27 ENCOUNTER — Ambulatory Visit: Payer: Medicaid Other | Admitting: Internal Medicine

## 2023-11-27 LAB — LAB REPORT - SCANNED: EGFR: 19

## 2023-11-27 LAB — SEDIMENTATION RATE: Sed Rate: 26 mm/h (ref 0–30)

## 2023-11-28 ENCOUNTER — Ambulatory Visit: Payer: Medicaid Other | Admitting: Physical Therapy

## 2023-12-05 ENCOUNTER — Ambulatory Visit: Payer: Medicaid Other | Attending: Orthopaedic Surgery | Admitting: Physical Therapy

## 2023-12-05 ENCOUNTER — Encounter: Payer: Self-pay | Admitting: Physical Therapy

## 2023-12-05 DIAGNOSIS — G8929 Other chronic pain: Secondary | ICD-10-CM | POA: Diagnosis present

## 2023-12-05 DIAGNOSIS — M25512 Pain in left shoulder: Secondary | ICD-10-CM | POA: Diagnosis present

## 2023-12-05 DIAGNOSIS — M25612 Stiffness of left shoulder, not elsewhere classified: Secondary | ICD-10-CM | POA: Diagnosis present
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
## 2023-12-10 ENCOUNTER — Other Ambulatory Visit: Payer: Medicaid Other

## 2023-12-12 ENCOUNTER — Ambulatory Visit: Payer: Medicaid Other | Admitting: Physical Therapy

## 2023-12-19 ENCOUNTER — Ambulatory Visit: Payer: Medicaid Other

## 2023-12-19 DIAGNOSIS — M25612 Stiffness of left shoulder, not elsewhere classified: Secondary | ICD-10-CM

## 2023-12-19 DIAGNOSIS — G8929 Other chronic pain: Secondary | ICD-10-CM
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
## 2023-12-22 ENCOUNTER — Other Ambulatory Visit: Payer: Medicaid Other

## 2023-12-29 ENCOUNTER — Encounter: Payer: Self-pay | Admitting: Orthopaedic Surgery

## 2023-12-29 ENCOUNTER — Ambulatory Visit: Payer: Medicaid Other | Admitting: Orthopaedic Surgery

## 2023-12-29 DIAGNOSIS — M25512 Pain in left shoulder: Secondary | ICD-10-CM

## 2023-12-29 DIAGNOSIS — M7502 Adhesive capsulitis of left shoulder: Secondary | ICD-10-CM

## 2023-12-29 DIAGNOSIS — G8929 Other chronic pain: Secondary | ICD-10-CM | POA: Diagnosis not present

## 2023-12-29 NOTE — Progress Notes (Signed)
 The patient comes in today for continued follow-up for his left shoulder and his significant arthrofibrosis of that left shoulder.  He has been through physical therapy and he has had an intra-articular injection of a steroid in the left shoulder.  He is a diabetic.  His last hemoglobin A1c 1 month ago was 8.4.  He still has significant limitations of his left shoulder on exam.  There is a lot of crepitation with that shoulder and he is using more of his deltoids to abduct the shoulder.  I could not abduct him past 90 degrees and his forward flexion was also significant limited.  His external rotation and internal rotation were also limited  X-rays of his left shoulder back in August of this past year did not show any acute findings and the joint space was well-maintained.  At this point I would like to send him to my partner, Dr. Genelle to further assess the patient's left shoulder and determine whether or not surgery is needed and appropriate at this standpoint.  I have counseled him about good blood glucose control as well.

## 2023-12-31 ENCOUNTER — Encounter: Payer: Self-pay | Admitting: Physical Therapy

## 2023-12-31 ENCOUNTER — Ambulatory Visit: Payer: Medicaid Other | Attending: Orthopaedic Surgery | Admitting: Physical Therapy

## 2023-12-31 DIAGNOSIS — G8929 Other chronic pain: Secondary | ICD-10-CM

## 2023-12-31 DIAGNOSIS — M25512 Pain in left shoulder: Secondary | ICD-10-CM | POA: Insufficient documentation

## 2023-12-31 DIAGNOSIS — M25612 Stiffness of left shoulder, not elsewhere classified: Secondary | ICD-10-CM | POA: Diagnosis present
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
## 2024-01-08 NOTE — Telephone Encounter (Signed)
Offered patient an appointment, patient stated he no longer needs the appointment.

## 2024-01-14 ENCOUNTER — Encounter (HOSPITAL_BASED_OUTPATIENT_CLINIC_OR_DEPARTMENT_OTHER): Payer: Self-pay

## 2024-01-14 ENCOUNTER — Ambulatory Visit (HOSPITAL_BASED_OUTPATIENT_CLINIC_OR_DEPARTMENT_OTHER): Payer: Medicaid Other | Admitting: Orthopaedic Surgery

## 2024-01-16 ENCOUNTER — Ambulatory Visit (HOSPITAL_BASED_OUTPATIENT_CLINIC_OR_DEPARTMENT_OTHER): Payer: Medicaid Other | Admitting: Orthopaedic Surgery

## 2024-01-16 ENCOUNTER — Telehealth: Payer: Self-pay

## 2024-01-16 DIAGNOSIS — N3941 Urge incontinence: Secondary | ICD-10-CM

## 2024-01-16 DIAGNOSIS — N138 Other obstructive and reflux uropathy: Secondary | ICD-10-CM

## 2024-01-16 DIAGNOSIS — S46002A Unspecified injury of muscle(s) and tendon(s) of the rotator cuff of left shoulder, initial encounter: Secondary | ICD-10-CM

## 2024-01-16 NOTE — Telephone Encounter (Signed)
[facility] does not accept [facility] Medicaid. Confirmed that [facility] accepts patients insurance.
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
## 2024-01-19 ENCOUNTER — Encounter (HOSPITAL_BASED_OUTPATIENT_CLINIC_OR_DEPARTMENT_OTHER): Payer: Self-pay | Admitting: Orthopaedic Surgery

## 2024-01-23 ENCOUNTER — Ambulatory Visit
Admission: RE | Admit: 2024-01-23 | Discharge: 2024-01-23 | Disposition: A | Discharge status: Home / Self Care | Payer: Medicaid Other | Source: Ambulatory Visit | Attending: Orthopaedic Surgery | Admitting: Orthopaedic Surgery

## 2024-01-23 DIAGNOSIS — S46002A Unspecified injury of muscle(s) and tendon(s) of the rotator cuff of left shoulder, initial encounter: Secondary | ICD-10-CM

## 2024-03-12 ENCOUNTER — Other Ambulatory Visit (INDEPENDENT_AMBULATORY_CARE_PROVIDER_SITE_OTHER): Payer: Medicaid Other

## 2024-03-12 ENCOUNTER — Telehealth: Payer: Self-pay

## 2024-03-12 DIAGNOSIS — E1165 Type 2 diabetes mellitus with hyperglycemia: Secondary | ICD-10-CM

## 2024-03-12 NOTE — Telephone Encounter (Signed)
 Patient would like to get prescribed ozempic. Previously medication was not covered, but patient would like retry getting medication.

## 2024-03-15 LAB — HEMOGLOBIN A1C
Est. average glucose Bld gHb Est-mCnc: 226 mg/dL
Hgb A1c MFr Bld: 9.5 % — ABNORMAL HIGH (ref 4.8–5.6)

## 2024-03-16 ENCOUNTER — Telehealth: Payer: Self-pay

## 2024-03-16 NOTE — Telephone Encounter (Signed)
 Patient needs a sooner appointment than July.  Patient is available in the afternoons.   We will call patient if there is a cancellation.

## 2024-03-17 ENCOUNTER — Ambulatory Visit (HOSPITAL_BASED_OUTPATIENT_CLINIC_OR_DEPARTMENT_OTHER): Payer: Self-pay | Admitting: Orthopaedic Surgery

## 2024-03-17 ENCOUNTER — Ambulatory Visit (HOSPITAL_BASED_OUTPATIENT_CLINIC_OR_DEPARTMENT_OTHER): Admitting: Orthopaedic Surgery

## 2024-03-17 ENCOUNTER — Ambulatory Visit: Payer: Medicaid Other | Admitting: Internal Medicine

## 2024-03-17 ENCOUNTER — Encounter (HOSPITAL_BASED_OUTPATIENT_CLINIC_OR_DEPARTMENT_OTHER): Payer: Self-pay | Admitting: Orthopaedic Surgery

## 2024-03-17 ENCOUNTER — Other Ambulatory Visit (HOSPITAL_BASED_OUTPATIENT_CLINIC_OR_DEPARTMENT_OTHER): Payer: Self-pay

## 2024-03-17 DIAGNOSIS — S46002A Unspecified injury of muscle(s) and tendon(s) of the rotator cuff of left shoulder, initial encounter: Secondary | ICD-10-CM

## 2024-03-17 MED ORDER — ACETAMINOPHEN 500 MG PO TABS
500.0000 mg | ORAL_TABLET | Freq: Three times a day (TID) | ORAL | 0 refills | Status: AC
  Filled 2024-03-17: qty 30, 10d supply, fill #0

## 2024-03-17 MED ORDER — ASPIRIN 325 MG PO TBEC
325.0000 mg | DELAYED_RELEASE_TABLET | Freq: Every day | ORAL | 0 refills | Status: DC
  Filled 2024-03-17: qty 14, 14d supply, fill #0

## 2024-03-17 MED ORDER — IBUPROFEN 800 MG PO TABS
800.0000 mg | ORAL_TABLET | Freq: Three times a day (TID) | ORAL | 0 refills | Status: AC
  Filled 2024-03-17: qty 30, 10d supply, fill #0

## 2024-03-17 MED ORDER — OXYCODONE HCL 5 MG PO TABS
5.0000 mg | ORAL_TABLET | ORAL | 0 refills | Status: DC | PRN
  Filled 2024-03-17: qty 15, 3d supply, fill #0
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
## 2024-03-29 ENCOUNTER — Other Ambulatory Visit (HOSPITAL_BASED_OUTPATIENT_CLINIC_OR_DEPARTMENT_OTHER): Payer: Self-pay

## 2024-03-29 MED ORDER — OZEMPIC (0.25 OR 0.5 MG/DOSE) 2 MG/3ML ~~LOC~~ SOPN: PEN_INJECTOR | SUBCUTANEOUS | 11 refills | Status: AC

## 2024-03-29 NOTE — Telephone Encounter (Signed)
 Patient aware.

## 2024-04-06 ENCOUNTER — Other Ambulatory Visit: Payer: Self-pay

## 2024-04-06 MED ORDER — LANTUS SOLOSTAR 100 UNIT/ML ~~LOC~~ SOPN: PEN_INJECTOR | SUBCUTANEOUS | 2 refills | Status: AC

## 2024-04-06 NOTE — Telephone Encounter (Signed)
 Called patient to offer appointment, patient did not take.

## 2024-04-08 ENCOUNTER — Telehealth: Payer: Self-pay

## 2024-04-08 NOTE — Telephone Encounter (Signed)
 Patient reports picking up ozempic and starting Wednesday morning.  Patient is scheduled for weight check and sugar log check in two weeks 04/21/2024

## 2024-04-08 NOTE — Telephone Encounter (Signed)
Called patient to offer appointment, patient did not answer.

## 2024-04-14 NOTE — Telephone Encounter (Signed)
Called patient to offer appointment, patient did not answer.

## 2024-04-15 NOTE — Telephone Encounter (Signed)
 Called patient to offer appointment , patient did not take and stated that he had told us  that he needs a week in advance for an appointment .

## 2024-04-21 ENCOUNTER — Other Ambulatory Visit: Admitting: Internal Medicine

## 2024-04-23 ENCOUNTER — Other Ambulatory Visit: Admitting: Internal Medicine

## 2024-04-27 NOTE — Progress Notes (Signed)
 Per Dr. Jayne Mews patient needs to start back over with process due to not taking medication

## 2024-05-27 ENCOUNTER — Telehealth: Payer: Self-pay

## 2024-05-27 MED ORDER — BACLOFEN 5 MG PO TABS: ORAL_TABLET | ORAL | 1 refills | Status: DC

## 2024-05-27 MED ORDER — PANTOPRAZOLE SODIUM 40 MG PO TBEC: DELAYED_RELEASE_TABLET | ORAL | 1 refills | Status: AC

## 2024-05-27 NOTE — Telephone Encounter (Signed)
Medication refill sent to pharmacy  

## 2024-05-27 NOTE — Telephone Encounter (Signed)
 Patient left a voicemail, patient states if he can get a refill on medication , patient states he does not know the name of medication, but medication was prescribed for hiccups .  Patient reports hiccups are back and frequent.

## 2024-05-28 ENCOUNTER — Other Ambulatory Visit: Payer: Self-pay | Admitting: *Deleted

## 2024-05-28 DIAGNOSIS — N186 End stage renal disease: Secondary | ICD-10-CM

## 2024-06-16 ENCOUNTER — Encounter (HOSPITAL_COMMUNITY)

## 2024-06-16 ENCOUNTER — Encounter: Admitting: Vascular Surgery

## 2024-06-16 ENCOUNTER — Other Ambulatory Visit (HOSPITAL_COMMUNITY)

## 2024-07-08 ENCOUNTER — Ambulatory Visit (HOSPITAL_COMMUNITY)
Admission: RE | Admit: 2024-07-08 | Discharge: 2024-07-08 | Disposition: A | Discharge status: Home / Self Care | Source: Ambulatory Visit | Attending: Vascular Surgery | Admitting: Vascular Surgery

## 2024-07-08 ENCOUNTER — Ambulatory Visit (HOSPITAL_BASED_OUTPATIENT_CLINIC_OR_DEPARTMENT_OTHER)
Admission: RE | Admit: 2024-07-08 | Discharge: 2024-07-08 | Disposition: A | Discharge status: Home / Self Care | Source: Ambulatory Visit | Attending: Vascular Surgery | Admitting: Vascular Surgery

## 2024-07-08 ENCOUNTER — Ambulatory Visit: Admitting: Vascular Surgery

## 2024-07-08 ENCOUNTER — Encounter: Payer: Self-pay | Admitting: Vascular Surgery

## 2024-07-08 ENCOUNTER — Telehealth: Payer: Self-pay

## 2024-07-08 ENCOUNTER — Ambulatory Visit: Admitting: Internal Medicine

## 2024-07-08 VITALS — BP 140/85 | HR 94 | Temp 98.4°F | Resp 18 | Ht 64.0 in | Wt 166.6 lb

## 2024-07-08 DIAGNOSIS — N184 Chronic kidney disease, stage 4 (severe): Secondary | ICD-10-CM | POA: Insufficient documentation

## 2024-07-08 DIAGNOSIS — N186 End stage renal disease: Secondary | ICD-10-CM | POA: Diagnosis present

## 2024-07-08 NOTE — Telephone Encounter (Signed)
 Patient stated he was not ready to schedule surgery for R arm AVF.  Will call when ready to schedule surgery with Dr. Lanis.
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
## 2024-07-09 ENCOUNTER — Encounter: Payer: Self-pay | Admitting: Advanced Practice Midwife

## 2024-08-18 ENCOUNTER — Telehealth: Payer: Self-pay

## 2024-08-18 NOTE — Telephone Encounter (Signed)
 Patient was seen by Dr. Lanis on 7/17.  On 7/17, patient stated during telephone call that he was not ready to schedule.  Patient has not outreached to office.  Letter generated / paperwork sent to scanning.

## 2024-09-23 ENCOUNTER — Ambulatory Visit: Admitting: Internal Medicine

## 2024-09-24 ENCOUNTER — Telehealth: Payer: Self-pay | Admitting: Internal Medicine

## 2024-09-24 NOTE — Telephone Encounter (Signed)
 Patient missed his appointment and would like to be on wait list for cancellations

## 2024-10-04 NOTE — Telephone Encounter (Signed)
Called patient to offer appointment, patient did not answer.

## 2024-10-06 NOTE — Telephone Encounter (Signed)
 Patient has been scheduled

## 2024-10-07 ENCOUNTER — Ambulatory Visit: Admitting: Internal Medicine

## 2024-10-07 ENCOUNTER — Telehealth: Payer: Self-pay | Admitting: Internal Medicine

## 2024-10-07 NOTE — Telephone Encounter (Signed)
 Patient had to cancel his appointment on 10/07/24 due to work.  Patient was rescheduled for 01/06/24. Patient would like a sooner appointment   Patient is available any day

## 2024-10-21 NOTE — Telephone Encounter (Signed)
 Called patient to offer appointment, patient did not take.

## 2024-10-25 ENCOUNTER — Encounter: Payer: Self-pay | Admitting: Radiology

## 2024-10-25 ENCOUNTER — Encounter: Payer: Self-pay | Admitting: Internal Medicine

## 2024-10-25 ENCOUNTER — Telehealth: Payer: Self-pay | Admitting: Internal Medicine

## 2024-10-25 ENCOUNTER — Other Ambulatory Visit: Payer: Self-pay

## 2024-10-25 ENCOUNTER — Encounter (HOSPITAL_COMMUNITY): Payer: Self-pay

## 2024-10-25 ENCOUNTER — Emergency Department (HOSPITAL_COMMUNITY)
Admission: EM | Admit: 2024-10-25 | Discharge: 2024-10-26 | Disposition: A | Discharge status: Home / Self Care | Attending: Emergency Medicine | Admitting: Emergency Medicine

## 2024-10-25 DIAGNOSIS — R1900 Intra-abdominal and pelvic swelling, mass and lump, unspecified site: Secondary | ICD-10-CM

## 2024-10-25 DIAGNOSIS — R066 Hiccough: Secondary | ICD-10-CM | POA: Diagnosis present

## 2024-10-25 DIAGNOSIS — Z7982 Long term (current) use of aspirin: Secondary | ICD-10-CM | POA: Diagnosis not present

## 2024-10-25 NOTE — Telephone Encounter (Signed)
 Received a call today from Sherika From [facility] states they are a company contracted through [facility] and they do kidney health management.   Sherika from [facility] states she was calling to notify doctor or clinical staff that patient was prescribed medication Baclofen  5mg  that could be potentially toxic to kidneys.  Notified her Per Dr. Adella that we will try to get a hold of patient and also notified her that patient is noncompliant and he has missed previous appointments , and patient needs to be more compliant.   Received call from phone number [phone]

## 2024-10-25 NOTE — ED Triage Notes (Signed)
 Pt is coming in for Hiccups, this is something he has been dealing with for around a month now, he mentions that in the past couple visits they have given him Reglan  which was too strong and then baclofen  which worked for him. He is otherwise stable at this time in triage with no other complaints at this time.

## 2024-10-26 ENCOUNTER — Telehealth: Payer: Self-pay | Admitting: Internal Medicine

## 2024-10-26 MED ORDER — ALUM & MAG HYDROXIDE-SIMETH 200-200-20 MG/5ML PO SUSP
30.0000 mL | Freq: Once | ORAL | Status: AC
  Administered 2024-10-26: 30 mL via ORAL
  Filled 2024-10-26: qty 30

## 2024-10-26 MED ORDER — PROCHLORPERAZINE MALEATE 10 MG PO TABS: 10.0000 mg | ORAL_TABLET | Freq: Four times a day (QID) | ORAL | 0 refills | Status: DC | PRN

## 2024-10-26 MED ORDER — LIDOCAINE VISCOUS HCL 2 % MT SOLN
15.0000 mL | Freq: Once | OROMUCOSAL | Status: AC
  Administered 2024-10-26: 15 mL via ORAL
  Filled 2024-10-26: qty 15

## 2024-10-26 MED ORDER — PROCHLORPERAZINE MALEATE 10 MG PO TABS
10.0000 mg | ORAL_TABLET | Freq: Once | ORAL | Status: AC
  Administered 2024-10-26: 10 mg via ORAL
  Filled 2024-10-26: qty 1

## 2024-10-26 NOTE — Discharge Instructions (Signed)
Follow up with your primary care provider for recheck  ?

## 2024-10-26 NOTE — ED Provider Notes (Signed)
 [place] EMERGENCY DEPARTMENT AT [facility] Provider Note   CSN: [id number] Arrival date & time: 10/25/24  2331     Patient presents with: Hiccups   Walter Harmon is a 58 y.o. male.   58 year old male presents with complaint of hiccups.  He states that he has had hiccups for the past month.  He takes baclofen  with some improvement however he ran out of his baclofen  yesterday.  He states that because of his hiccups, he struggles with acid reflux.  He denies chest pain, abdominal pain or other complaints.  He has been to his primary care for this without cause for hiccups found.       Prior to Admission medications   Medication Sig Start Date End Date Taking? Authorizing Provider  prochlorperazine (COMPAZINE) 10 MG tablet Take 1 tablet (10 mg total) by mouth every 6 (six) hours as needed (PRN Hiccups). 10/26/24  Yes Beverley Leita DELENA, PA-C  albuterol  (PROAIR  HFA) 108 (90 Base) MCG/ACT inhaler INHALE TWO PUFFS BY MOUTH EVERY 6 HOURS AS NEEDED FOR WHEEZING 10/22/23   Adella Norris, MD  amLODipine  (NORVASC ) 5 MG tablet Take 1 tablet (5 mg total) by mouth daily. 10/24/23   Adella Norris, MD  amLODipine -atorvastatin  (CADUET ) 5-40 MG tablet Take 1 tablet by mouth daily.    [provider]  aspirin  EC 325 MG tablet Take 1 tablet (325 mg total) by mouth daily. Patient not taking: Reported on 07/08/2024 03/17/24   Genelle Standing, MD  aspirin  EC 81 MG tablet Take 1 tablet (81 mg total) by mouth daily. 10/22/23   Adella Norris, MD  atorvastatin  (LIPITOR) 40 MG tablet Take 1 tablet (40 mg total) by mouth daily. 10/24/23   Adella Norris, MD  Baclofen  5 MG TABS 1 to 2 tabs by mouth every 8 hours for hiccups 05/27/24   Adella Norris, MD  blood glucose meter kit and supplies KIT Dispense glucometer and testing supplies based on patient and insurance preference. Use up to four times daily as directed. (FOR ICD-9 250.00, 250.01). 10/22/23   Adella Norris, MD  budesonide -formoterol  (SYMBICORT ) 160-4.5 MCG/ACT inhaler INHALE TWO PUFFS INTO THE LUNGS 2 TIMES A DAY 10/22/23   Adella Norris, MD  dapagliflozin  propanediol (FARXIGA ) 10 MG TABS tablet Take 1 tablet (10 mg total) by mouth daily. 10/22/23   Adella Norris, MD  dutasteride  (AVODART ) 0.5 MG capsule Take 1 capsule (0.5 mg total) by mouth daily. 11/26/23   Adella Norris, MD  insulin  aspart (NOVOLOG  FLEXPEN) 100 UNIT/ML FlexPen INJECT 0-6 UNITS subcutaneously 3 TIMES A DAY 10 minutes before meals 10/22/23   Adella Norris, MD  insulin  glargine (LANTUS  SOLOSTAR) 100 UNIT/ML Solostar Pen Inject 22 units SQ every morning. 04/06/24   Adella Norris, MD  Insulin  Pen Needle (PEN NEEDLES) 30G X 8 MM MISC Inject insulin  subcutaneously 4 times daily. 10/22/23   Adella Norris, MD  Multiple Vitamin (MULTIVITAMIN WITH MINERALS) TABS tablet Take 1 tablet by mouth daily. 05/26/19   Claudene Pacific, MD  oxyCODONE  (ROXICODONE ) 5 MG immediate release tablet Take 1 tablet (5 mg total) by mouth every 4 (four) hours as needed for severe pain (pain score 7-10) or breakthrough pain. 03/17/24   Genelle Standing, MD  pantoprazole  (PROTONIX ) 40 MG tablet 1 tab by mouth on empty stomach 30 minutes before breakfast and other meds. 05/27/24   Adella Norris, MD  Semaglutide ,0.25 or 0.5MG /DOS, (OZEMPIC , 0.25 OR 0.5 MG/DOSE,) 2 MG/3ML SOPN Inject 0.25 mg subcutaneously once weekly 03/29/24   Adella Norris,  MD  sucralfate  (CARAFATE ) 1 g tablet DISSOLVE 1 TABLET IN 30 ml  OF WATER TO MAKE SLURRY AND SWALLOW BEFORE MEALS AND AT BEDTIME 4 times daily. DO NOT TAKE WITH OTHER ORAL MEDS Patient not taking: Reported on 11/26/2023 10/24/23   Adella Norris, MD  tamsulosin  (FLOMAX ) 0.4 MG CAPS capsule 1 tab by mouth daily at bedtime 11/26/23   Adella Norris, MD    Allergies: Patient has no known allergies.    Review of Systems Negative except as per HPI Updated Vital Signs BP (!)  155/115 (BP Location: Left Arm)   Pulse (!) 115   Temp 98.2 F (36.8 C) (Oral)   Resp 17   SpO2 100%   Physical Exam Vitals and nursing note reviewed.  Constitutional:      General: He is not in acute distress.    Appearance: He is well-developed. He is not diaphoretic.     Comments: Pacing the room with frequent hiccups, appears uncomfortable  HENT:     Head: Normocephalic and atraumatic.  Cardiovascular:     Rate and Rhythm: Normal rate and regular rhythm.     Heart sounds: Normal heart sounds.  Pulmonary:     Effort: Pulmonary effort is normal.     Breath sounds: Normal breath sounds.  Neurological:     Mental Status: He is alert and oriented to person, place, and time.  Psychiatric:        Behavior: Behavior normal.     (all labs ordered are listed, but only abnormal results are displayed) Labs Reviewed - No data to display  EKG: None  Radiology: No results found.   Procedures   Medications Ordered in the ED  alum & mag hydroxide-simeth (MAALOX/MYLANTA) 200-200-20 MG/5ML suspension 30 mL (30 mLs Oral Given 10/26/24 0535)    And  lidocaine  (XYLOCAINE ) 2 % viscous mouth solution 15 mL (15 mLs Oral Given 10/26/24 0535)  prochlorperazine (COMPAZINE) tablet 10 mg (10 mg Oral Given 10/26/24 0535)                                    Medical Decision Making Risk OTC drugs. Prescription drug management.   58 year old male with complaint of hiccups.  He is pacing the room and appears uncomfortable.  He states that because of the hiccups, he is suffering with acid reflux as well.  He feels like the baclofen  does not really work very well but at least lessens the intensity of the hiccups.  He has never tried Compazine.  Plan is to try Compazine.  He also requests GI cocktail which he feels works well for the reflux. Symptoms have resolved.  Patient is feeling significantly improved.  Discharged with prescription for Compazine advised to follow-up with primary care  provider in 2 days.     Final diagnoses:  Hiccups    ED Discharge Orders          Ordered    prochlorperazine (COMPAZINE) 10 MG tablet  Every 6 hours PRN        10/26/24 0609               Beverley Leita LABOR, PA-C 10/26/24 9390    Griselda Norris, MD 10/26/24 [phone]

## 2024-10-26 NOTE — Telephone Encounter (Signed)
 Patient called and states he would like for Doctor Adella to know that he went to the emergency room yesterday 10/25/2024. Patient states he went to ER due to his Hiccups that keep coming back.  Patient states that when he is lying down or sleeping he does not have hiccups but if he is moving a lot or if he bends over to pick something up hiccups come back.

## 2024-10-27 ENCOUNTER — Emergency Department (HOSPITAL_COMMUNITY)
Admission: EM | Admit: 2024-10-27 | Discharge: 2024-10-27 | Disposition: A | Discharge status: Home / Self Care | Attending: Emergency Medicine | Admitting: Emergency Medicine

## 2024-10-27 ENCOUNTER — Other Ambulatory Visit: Payer: Self-pay

## 2024-10-27 DIAGNOSIS — N189 Chronic kidney disease, unspecified: Secondary | ICD-10-CM | POA: Diagnosis not present

## 2024-10-27 DIAGNOSIS — Z794 Long term (current) use of insulin: Secondary | ICD-10-CM | POA: Insufficient documentation

## 2024-10-27 DIAGNOSIS — E1122 Type 2 diabetes mellitus with diabetic chronic kidney disease: Secondary | ICD-10-CM | POA: Diagnosis not present

## 2024-10-27 DIAGNOSIS — E871 Hypo-osmolality and hyponatremia: Secondary | ICD-10-CM | POA: Diagnosis not present

## 2024-10-27 DIAGNOSIS — R066 Hiccough: Secondary | ICD-10-CM | POA: Diagnosis present

## 2024-10-27 DIAGNOSIS — I129 Hypertensive chronic kidney disease with stage 1 through stage 4 chronic kidney disease, or unspecified chronic kidney disease: Secondary | ICD-10-CM | POA: Diagnosis not present

## 2024-10-27 DIAGNOSIS — Z79899 Other long term (current) drug therapy: Secondary | ICD-10-CM | POA: Diagnosis not present

## 2024-10-27 DIAGNOSIS — Z7982 Long term (current) use of aspirin: Secondary | ICD-10-CM | POA: Diagnosis not present

## 2024-10-27 LAB — COMPREHENSIVE METABOLIC PANEL WITH GFR
ALT: 15 U/L (ref 0–44)
AST: 13 U/L — ABNORMAL LOW (ref 15–41)
Albumin: 3.7 g/dL (ref 3.5–5.0)
Alkaline Phosphatase: 69 U/L (ref 38–126)
Anion gap: 12 (ref 5–15)
BUN: 40 mg/dL — ABNORMAL HIGH (ref 6–20)
CO2: 25 mmol/L (ref 22–32)
Calcium: 10.9 mg/dL — ABNORMAL HIGH (ref 8.9–10.3)
Chloride: 96 mmol/L — ABNORMAL LOW (ref 98–111)
Creatinine, Ser: 5.65 mg/dL — ABNORMAL HIGH (ref 0.61–1.24)
GFR, Estimated: 11 mL/min — ABNORMAL LOW (ref >60)
Glucose, Bld: 189 mg/dL — ABNORMAL HIGH (ref 70–99)
Potassium: 4.4 mmol/L (ref 3.5–5.1)
Sodium: 133 mmol/L — ABNORMAL LOW (ref 135–145)
Total Bilirubin: 0.7 mg/dL (ref 0.0–1.2)
Total Protein: 8 g/dL (ref 6.5–8.1)

## 2024-10-27 LAB — CBC WITH DIFFERENTIAL/PLATELET
Abs Immature Granulocytes: 0.02 K/uL (ref 0.00–0.07)
Basophils Absolute: 0 K/uL (ref 0.0–0.1)
Basophils Relative: 0 %
Eosinophils Absolute: 0.1 K/uL (ref 0.0–0.5)
Eosinophils Relative: 1 %
HCT: 47.4 % (ref 39.0–52.0)
Hemoglobin: 15.4 g/dL (ref 13.0–17.0)
Immature Granulocytes: 0 %
Lymphocytes Relative: 11 %
Lymphs Abs: 1 K/uL (ref 0.7–4.0)
MCH: 30.7 pg (ref 26.0–34.0)
MCHC: 32.5 g/dL (ref 30.0–36.0)
MCV: 94.6 fL (ref 80.0–100.0)
Monocytes Absolute: 0.6 K/uL (ref 0.1–1.0)
Monocytes Relative: 7 %
Neutro Abs: 7.2 K/uL (ref 1.7–7.7)
Neutrophils Relative %: 81 %
Platelets: 268 K/uL (ref 150–400)
RBC: 5.01 MIL/uL (ref 4.22–5.81)
RDW: 13.2 % (ref 11.5–15.5)
WBC: 8.9 K/uL (ref 4.0–10.5)
nRBC: 0 % (ref 0.0–0.2)

## 2024-10-27 LAB — LIPASE, BLOOD: Lipase: 44 U/L (ref 11–51)

## 2024-10-27 MED ORDER — LACTATED RINGERS IV BOLUS
1000.0000 mL | Freq: Once | INTRAVENOUS | Status: AC
  Administered 2024-10-27: 1000 mL via INTRAVENOUS

## 2024-10-27 MED ORDER — PANTOPRAZOLE SODIUM 40 MG IV SOLR
40.0000 mg | Freq: Once | INTRAVENOUS | Status: AC
  Administered 2024-10-27: 40 mg via INTRAVENOUS
  Filled 2024-10-27: qty 10

## 2024-10-27 MED ORDER — ALUM & MAG HYDROXIDE-SIMETH 200-200-20 MG/5ML PO SUSP
30.0000 mL | Freq: Once | ORAL | Status: AC
  Administered 2024-10-27: 30 mL via ORAL
  Filled 2024-10-27: qty 30

## 2024-10-27 MED ORDER — BACLOFEN 10 MG PO TABS
10.0000 mg | ORAL_TABLET | Freq: Once | ORAL | Status: AC
  Administered 2024-10-27: 10 mg via ORAL
  Filled 2024-10-27: qty 1

## 2024-10-27 MED ORDER — LIDOCAINE VISCOUS HCL 2 % MT SOLN
15.0000 mL | Freq: Once | OROMUCOSAL | Status: AC
  Administered 2024-10-27: 15 mL via ORAL
  Filled 2024-10-27: qty 15

## 2024-10-27 NOTE — ED Triage Notes (Signed)
 Pt. Stated, Ive had hiccups and acid reflux for a month

## 2024-10-27 NOTE — ED Notes (Signed)
 Pt observed walking out door putting on coat. He did not state he was leaving.  Will give it a few then DC if he does not return.

## 2024-10-27 NOTE — ED Provider Notes (Signed)
 [place] EMERGENCY DEPARTMENT AT [facility] Provider Note   CSN: [id number] Arrival date & time: 10/27/24  1155     Patient presents with: Gastroesophageal Reflux and Hiccups   Walter Harmon is a 58 y.o. male with history of CKD, hypertension, diabetes, presents with concern for ongoing hiccups over the past month as well as acid reflux that has been happening as a result of the hiccups.  He reports that it is difficult to keep foods and liquids down to do the hiccuping.  Reports some mild abdominal pain associated with this. No chest pain or shortness of breath.  Reports he was given baclofen  by his PCP which seemed to help with his hiccuping, but is not completely resolved.  He reports that the Compazine given to him at his last ER visit did not help much.    Gastroesophageal Reflux       Prior to Admission medications   Medication Sig Start Date End Date Taking? Authorizing Provider  albuterol  (PROAIR  HFA) 108 (90 Base) MCG/ACT inhaler INHALE TWO PUFFS BY MOUTH EVERY 6 HOURS AS NEEDED FOR WHEEZING 10/22/23   Adella Norris, MD  amLODipine  (NORVASC ) 5 MG tablet Take 1 tablet (5 mg total) by mouth daily. 10/24/23   Adella Norris, MD  amLODipine -atorvastatin  (CADUET ) 5-40 MG tablet Take 1 tablet by mouth daily.    [provider]  aspirin  EC 325 MG tablet Take 1 tablet (325 mg total) by mouth daily. Patient not taking: Reported on 07/08/2024 03/17/24   Genelle Standing, MD  aspirin  EC 81 MG tablet Take 1 tablet (81 mg total) by mouth daily. 10/22/23   Adella Norris, MD  atorvastatin  (LIPITOR) 40 MG tablet Take 1 tablet (40 mg total) by mouth daily. 10/24/23   Adella Norris, MD  Baclofen  5 MG TABS 1 to 2 tabs by mouth every 8 hours for hiccups 05/27/24   Adella Norris, MD  blood glucose meter kit and supplies KIT Dispense glucometer and testing supplies based on patient and insurance preference. Use up to four times daily as directed.  (FOR ICD-9 250.00, 250.01). 10/22/23   Adella Norris, MD  budesonide -formoterol  (SYMBICORT ) 160-4.5 MCG/ACT inhaler INHALE TWO PUFFS INTO THE LUNGS 2 TIMES A DAY 10/22/23   Adella Norris, MD  dapagliflozin  propanediol (FARXIGA ) 10 MG TABS tablet Take 1 tablet (10 mg total) by mouth daily. 10/22/23   Adella Norris, MD  dutasteride  (AVODART ) 0.5 MG capsule Take 1 capsule (0.5 mg total) by mouth daily. 11/26/23   Adella Norris, MD  insulin  aspart (NOVOLOG  FLEXPEN) 100 UNIT/ML FlexPen INJECT 0-6 UNITS subcutaneously 3 TIMES A DAY 10 minutes before meals 10/22/23   Adella Norris, MD  insulin  glargine (LANTUS  SOLOSTAR) 100 UNIT/ML Solostar Pen Inject 22 units SQ every morning. 04/06/24   Adella Norris, MD  Insulin  Pen Needle (PEN NEEDLES) 30G X 8 MM MISC Inject insulin  subcutaneously 4 times daily. 10/22/23   Adella Norris, MD  Multiple Vitamin (MULTIVITAMIN WITH MINERALS) TABS tablet Take 1 tablet by mouth daily. 05/26/19   Claudene Pacific, MD  oxyCODONE  (ROXICODONE ) 5 MG immediate release tablet Take 1 tablet (5 mg total) by mouth every 4 (four) hours as needed for severe pain (pain score 7-10) or breakthrough pain. 03/17/24   Genelle Standing, MD  pantoprazole  (PROTONIX ) 40 MG tablet 1 tab by mouth on empty stomach 30 minutes before breakfast and other meds. 05/27/24   Adella Norris, MD  prochlorperazine (COMPAZINE) 10 MG tablet Take 1 tablet (10 mg total) by mouth every  6 (six) hours as needed (PRN Hiccups). 10/26/24   Beverley Leita LABOR, PA-C  Semaglutide ,0.25 or 0.5MG /DOS, (OZEMPIC , 0.25 OR 0.5 MG/DOSE,) 2 MG/3ML SOPN Inject 0.25 mg subcutaneously once weekly 03/29/24   Adella Norris, MD  sucralfate  (CARAFATE ) 1 g tablet DISSOLVE 1 TABLET IN 30 ml  OF WATER TO MAKE SLURRY AND SWALLOW BEFORE MEALS AND AT BEDTIME 4 times daily. DO NOT TAKE WITH OTHER ORAL MEDS Patient not taking: Reported on 11/26/2023 10/24/23   Adella Norris, MD  tamsulosin  (FLOMAX ) 0.4 MG CAPS  capsule 1 tab by mouth daily at bedtime 11/26/23   Adella Norris, MD    Allergies: Patient has no known allergies.    Review of Systems  Gastrointestinal:        Hiccuping    Updated Vital Signs BP (!) 155/98 (BP Location: Right Arm)   Pulse 100   Temp 98.2 F (36.8 C) (Oral)   Resp 16   Ht 5' 4 (1.626 m)   Wt 75.3 kg   SpO2 99%   BMI 28.49 kg/m   Physical Exam Vitals and nursing note reviewed.  Constitutional:      General: He is not in acute distress.    Appearance: He is well-developed.     Comments: Mild intermittent hiccupping on exam. No vomiting  HENT:     Head: Normocephalic and atraumatic.  Eyes:     Conjunctiva/sclera: Conjunctivae normal.  Cardiovascular:     Rate and Rhythm: Normal rate and regular rhythm.     Heart sounds: No murmur heard. Pulmonary:     Effort: Pulmonary effort is normal. No respiratory distress.     Breath sounds: Normal breath sounds.  Abdominal:     Palpations: Abdomen is soft.     Tenderness: There is no abdominal tenderness.  Musculoskeletal:        General: No swelling.     Cervical back: Neck supple.  Skin:    General: Skin is warm and dry.     Capillary Refill: Capillary refill takes less than 2 seconds.  Neurological:     Mental Status: He is alert.  Psychiatric:        Mood and Affect: Mood normal.     (all labs ordered are listed, but only abnormal results are displayed) Labs Reviewed  COMPREHENSIVE METABOLIC PANEL WITH GFR - Abnormal; Notable for the following components:      Result Value   Sodium 133 (*)    Chloride 96 (*)    Glucose, Bld 189 (*)    BUN 40 (*)    Creatinine, Ser 5.65 (*)    Calcium  10.9 (*)    AST 13 (*)    GFR, Estimated 11 (*)    All other components within normal limits  CBC WITH DIFFERENTIAL/PLATELET  LIPASE, BLOOD    EKG: None  Radiology: No results found.   Procedures   Medications Ordered in the ED  lactated ringers bolus 1,000 mL (0 mLs Intravenous Stopped  10/27/24 2102)  baclofen  (LIORESAL ) tablet 10 mg (10 mg Oral Given 10/27/24 1835)  pantoprazole  (PROTONIX ) injection 40 mg (40 mg Intravenous Given 10/27/24 1836)  alum & mag hydroxide-simeth (MAALOX/MYLANTA) 200-200-20 MG/5ML suspension 30 mL (30 mLs Oral Given 10/27/24 1835)    And  lidocaine  (XYLOCAINE ) 2 % viscous mouth solution 15 mL (15 mLs Oral Given 10/27/24 1835)    Clinical Course as of 10/27/24 2118  Wed Oct 27, 2024  2049 Patient re-checked, hiccups resolved. HR now normal after fluids [AF]  Clinical Course User Index [AF] Veta Palma, PA-C                                 Medical Decision Making Amount and/or Complexity of Data Reviewed Labs: ordered.  Risk OTC drugs. Prescription drug management.     Differential diagnosis includes but is not limited to hiccups, acute cholecystitis, cholelithiasis, cholangitis, choledocholithiasis, peptic ulcer, gastritis, gastroenteritis, appendicitis, IBS, IBD, DKA, nephrolithiasis, UTI, pyelonephritis, pancreatitis, electrolyte abnormality   ED Course:  Upon initial evaluation, patient is well appearing, no acute distress.  Mild hiccuping upon arrival.  Mild tachycardia to about 110 upon arrival.  Patient does look slightly dehydrated with dry mucous membranes.  Abdomen soft nontender.  Labs Ordered: I Ordered, and personally interpreted labs.  The pertinent results include:   CBC within normal limits CMP with elevated creatinine 5.65, this is up from 3.83 taken 11 months ago.  BUN 40. Hyponatremia 133.  Hypercalcemia 10.9.  Elevated glucose 189 Lipase within normal limits  Medications Given: LR bolus Protonix  Maalox Baclofen   Upon re-evaluation, patient resting comfortably without hiccuping.  His tachycardia has resolved with the LR bolus.  Patient does have elevated creatinine today of 5.65, which is elevated from his baseline of about 3.8 (taken about 1 year ago). No recent creatinine to compare to.  Suspect this  is likely due to some decreased oral intake since he has not been eating and drinking well because of the hiccuping. No significant electrolyte abnormalities. No hyperkalemia.  Low concern for acute intra-abdominal pathology given his LFTs and lipase are within normal limits as well as abdomen being soft and nontender on exam.  Patient remains well-appearing and feel he is stable and appropriate for discharge home at this time.    Impression: Hiccupping Elevated creatinine Dehydration  Disposition:  The patient was discharged home with instructions to follow-up with PCP within the next 2 weeks for further management of hiccuping and recheck of his creatinine. Return precautions given and patient verbalized understanding.     Record Review: External records from outside source obtained and reviewed including ER note from 10/25/2024 where patient was also seen for his hiccuping and prescribed Compazine     This chart was dictated using voice recognition software, Dragon. Despite the best efforts of this provider to proofread and correct errors, errors may still occur which can change documentation meaning.       Final diagnoses:  Hiccups    ED Discharge Orders     None          Veta Palma, DEVONNA 10/27/24 2118    Francesca Elsie CROME, MD 10/27/24 2119

## 2024-10-27 NOTE — ED Notes (Signed)
Pt returned to [facility]

## 2024-10-27 NOTE — Discharge Instructions (Addendum)
 You were seen here today for your hiccups.  You are found to be slightly dehydrated and were given some fluids here today.  Please increase your intake of water at home.  Your kidney function (creatinine) was abnormal today. This means that your kidneys are not filtering as well as they should be.  Your creatinine was slightly worse than has been previously.  You need to follow-up with your PCP within the next 2 weeks for repeat of this value to ensure that it is going back down to your baseline.  Unfortunately, due to your decreased kidney function today, I am unable to prescribe the baclofen  (hiccup medication) for use at home.   Your liver and pancreas labs were normal today.  Please return to the ER for any change in mental status, worsening abdominal pain, chest pain, shortness of breath, any other concerning symptoms

## 2024-10-27 NOTE — Telephone Encounter (Signed)
 Patient called today and reports he would like for doctor to know that he is now vomiting due to the hiccups.

## 2024-11-01 ENCOUNTER — Other Ambulatory Visit: Payer: Self-pay | Admitting: Internal Medicine

## 2024-11-02 ENCOUNTER — Telehealth: Payer: Self-pay | Admitting: Internal Medicine

## 2024-11-02 DIAGNOSIS — K8689 Other specified diseases of pancreas: Secondary | ICD-10-CM

## 2024-11-02 DIAGNOSIS — R066 Hiccough: Secondary | ICD-10-CM

## 2024-11-02 NOTE — Addendum Note (Signed)
 Addended by: ADELLA ALMARIE HERO on: 11/02/2024 10:37 AM   Modules accepted: Orders

## 2024-11-04 ENCOUNTER — Encounter: Payer: Self-pay | Admitting: Internal Medicine

## 2024-11-04 NOTE — Telephone Encounter (Signed)
 Notified patient that the GI referral and MR order have been sent. Patient states he already received a call for the MR and is scheduled.

## 2024-11-08 NOTE — Telephone Encounter (Incomplete)
 Went to ED

## 2024-11-12 ENCOUNTER — Other Ambulatory Visit

## 2024-11-17 ENCOUNTER — Ambulatory Visit
Admission: RE | Admit: 2024-11-17 | Discharge: 2024-11-17 | Disposition: A | Source: Ambulatory Visit | Attending: Internal Medicine | Admitting: Internal Medicine

## 2024-11-17 ENCOUNTER — Other Ambulatory Visit

## 2024-11-17 DIAGNOSIS — R1900 Intra-abdominal and pelvic swelling, mass and lump, unspecified site: Secondary | ICD-10-CM

## 2024-11-22 ENCOUNTER — Ambulatory Visit: Payer: Self-pay | Admitting: Internal Medicine

## 2024-11-22 NOTE — Progress Notes (Signed)
 I called them tow times but they were busy. I left them a call back voice massage

## 2024-11-25 ENCOUNTER — Telehealth: Payer: Self-pay | Admitting: Internal Medicine

## 2024-11-29 NOTE — Telephone Encounter (Signed)
 I tried reaching them both and Friday and today but I could not get hold of them. Today I left them call back voice massage

## 2024-11-30 ENCOUNTER — Encounter: Payer: Self-pay | Admitting: Physician Assistant

## 2025-01-05 ENCOUNTER — Ambulatory Visit: Admitting: Internal Medicine

## 2025-01-05 ENCOUNTER — Encounter: Payer: Self-pay | Admitting: Internal Medicine

## 2025-01-05 VITALS — BP 110/80 | HR 92 | Resp 20 | Ht 64.0 in | Wt 144.0 lb

## 2025-01-05 DIAGNOSIS — K8689 Other specified diseases of pancreas: Secondary | ICD-10-CM

## 2025-01-05 DIAGNOSIS — R066 Hiccough: Secondary | ICD-10-CM

## 2025-01-05 DIAGNOSIS — E782 Mixed hyperlipidemia: Secondary | ICD-10-CM

## 2025-01-05 DIAGNOSIS — E1165 Type 2 diabetes mellitus with hyperglycemia: Secondary | ICD-10-CM

## 2025-01-05 DIAGNOSIS — N401 Enlarged prostate with lower urinary tract symptoms: Secondary | ICD-10-CM

## 2025-01-05 DIAGNOSIS — I10 Essential (primary) hypertension: Secondary | ICD-10-CM

## 2025-01-05 DIAGNOSIS — Z23 Encounter for immunization: Secondary | ICD-10-CM

## 2025-01-05 DIAGNOSIS — N186 End stage renal disease: Secondary | ICD-10-CM

## 2025-01-05 MED ORDER — METOCLOPRAMIDE HCL 5 MG PO TABS: ORAL_TABLET | ORAL | 11 refills | Status: AC

## 2025-01-05 NOTE — Progress Notes (Unsigned)
 "   Subjective:    Patient ID: Walter Harmon, male   DOB: 15-Jan-1966, 59 y.o.   MRN: [id number]   HPI   DM:  Has not followed up as requested every 2 weeks until sugars controlled.  Medicaid does not cover the Farxiga , and would not benefit necessarily from the renal protection at this point.   Last A1C was before his last visit and not at goal at 9.5%.  States checks sugars twice daily, but has not brought sugar logs in as requested.   110-120 in the morning.  98 to 115 in evening. Oral intake is limited as he continues to have hiccups, has been on Ozempic  since April, so hiccups appear to be cause of 22 lb weight loss.     2.  Chronic recurrent hiccups--this episode since October.  He cannot take Baclofen  due to ESRD and none of the usual meds have helped and he has not had good follow up. He does have a pancreatic lesion of unknown significance.  MR to further assess in November and to be seen by [facility] this Friday hopefully to schedule endoscopic biopsy of lesion.  Not clear if this is the cause as he has had intermittent problems with hiccups extending back when he was 59 yo.  At the time felt due to gastric ulcer. He also has had  poorly controlled, though describes improved control with his weigh loss.   Discomfort in left epigastric area around to back with hiccups   3.  Hypertension:  Taking amlodipine .  BP for the first time at goal, likely due to weight loss and perhaps better compliance with meds.   4.  ESRD:  has not gone in for vascular access.  Encouraged him to get this done so available when needed and does not need to go in for other temporary vascular access.   Current Meds  Medication Sig   albuterol  (PROAIR  HFA) 108 (90 Base) MCG/ACT inhaler INHALE TWO PUFFS BY MOUTH EVERY 6 HOURS AS NEEDED FOR WHEEZING   amLODipine  (NORVASC ) 5 MG tablet Take 1 tablet (5 mg total) by mouth daily.   aspirin  EC 81 MG tablet Take 1 tablet (81 mg total) by mouth daily.    atorvastatin  (LIPITOR) 40 MG tablet Take 1 tablet (40 mg total) by mouth daily.   blood glucose meter kit and supplies KIT Dispense glucometer and testing supplies based on patient and insurance preference. Use up to four times daily as directed. (FOR ICD-9 250.00, 250.01).   budesonide -formoterol  (SYMBICORT ) 160-4.5 MCG/ACT inhaler INHALE TWO PUFFS INTO THE LUNGS 2 TIMES A DAY   insulin  aspart (NOVOLOG  FLEXPEN) 100 UNIT/ML FlexPen INJECT 0-6 UNITS subcutaneously 3 TIMES A DAY 10 minutes before meals   insulin  glargine (LANTUS  SOLOSTAR) 100 UNIT/ML Solostar Pen Inject 22 units SQ every morning.   Insulin  Pen Needle (PEN NEEDLES) 30G X 8 MM MISC Inject insulin  subcutaneously 4 times daily.   Multiple Vitamin (MULTIVITAMIN WITH MINERALS) TABS tablet Take 1 tablet by mouth daily.   pantoprazole  (PROTONIX ) 40 MG tablet 1 tab by mouth on empty stomach 30 minutes before breakfast and other meds.   Semaglutide ,0.25 or 0.5MG /DOS, (OZEMPIC , 0.25 OR 0.5 MG/DOSE,) 2 MG/3ML SOPN Inject 0.25 mg subcutaneously once weekly   tamsulosin  (FLOMAX ) 0.4 MG CAPS capsule 1 tab by mouth daily at bedtime   traMADol  (ULTRAM ) 50 MG tablet Take 50 mg by mouth every 6 (six) hours as needed (Patient is not sure of the dose).   No  Known Allergies   Review of Systems    Objective:   BP 110/80   Pulse 92   Resp 20   Ht 5' 4 (1.626 m)   Wt 144 lb (65.3 kg)   BMI 24.72 kg/m   Physical Exam Esam save for epigastric mild tenderness normal  Assessment & Plan  Hiccups--?gastroparesis "

## 2025-01-06 LAB — COMPREHENSIVE METABOLIC PANEL WITH GFR
ALT: 9 IU/L (ref 0–44)
AST: 9 IU/L (ref 0–40)
Albumin: 4.6 g/dL (ref 3.8–4.9)
Alkaline Phosphatase: 74 IU/L (ref 47–123)
BUN/Creatinine Ratio: 7 — ABNORMAL LOW (ref 9–20)
BUN: 35 mg/dL — ABNORMAL HIGH (ref 6–24)
Bilirubin Total: 0.7 mg/dL (ref 0.0–1.2)
CO2: 20 mmol/L (ref 20–29)
Calcium: 10.3 mg/dL — ABNORMAL HIGH (ref 8.7–10.2)
Chloride: 102 mmol/L (ref 96–106)
Creatinine, Ser: 5.35 mg/dL — ABNORMAL HIGH (ref 0.76–1.27)
Globulin, Total: 3.6 g/dL (ref 1.5–4.5)
Glucose: 104 mg/dL — ABNORMAL HIGH (ref 70–99)
Potassium: 4.6 mmol/L (ref 3.5–5.2)
Sodium: 138 mmol/L (ref 134–144)
Total Protein: 8.2 g/dL (ref 6.0–8.5)
eGFR: 12 mL/min/1.73 — ABNORMAL LOW

## 2025-01-06 LAB — LIPID PANEL W/O CHOL/HDL RATIO
Cholesterol, Total: 211 mg/dL — ABNORMAL HIGH (ref 100–199)
HDL: 33 mg/dL — ABNORMAL LOW
LDL Chol Calc (NIH): 148 mg/dL — ABNORMAL HIGH (ref 0–99)
Triglycerides: 163 mg/dL — ABNORMAL HIGH (ref 0–149)
VLDL Cholesterol Cal: 30 mg/dL (ref 5–40)

## 2025-01-06 LAB — CBC WITH DIFFERENTIAL/PLATELET
Basophils Absolute: 0 x10E3/uL (ref 0.0–0.2)
Basos: 1 %
EOS (ABSOLUTE): 0.3 x10E3/uL (ref 0.0–0.4)
Eos: 4 %
Hematocrit: 42.4 % (ref 37.5–51.0)
Hemoglobin: 14.1 g/dL (ref 13.0–17.7)
Immature Grans (Abs): 0 x10E3/uL (ref 0.0–0.1)
Immature Granulocytes: 0 %
Lymphocytes Absolute: 1.8 x10E3/uL (ref 0.7–3.1)
Lymphs: 28 %
MCH: 30.9 pg (ref 26.6–33.0)
MCHC: 33.3 g/dL (ref 31.5–35.7)
MCV: 93 fL (ref 79–97)
Monocytes Absolute: 0.5 x10E3/uL (ref 0.1–0.9)
Monocytes: 7 %
Neutrophils Absolute: 3.9 x10E3/uL (ref 1.4–7.0)
Neutrophils: 60 %
Platelets: 267 x10E3/uL (ref 150–450)
RBC: 4.56 x10E6/uL (ref 4.14–5.80)
RDW: 13 % (ref 11.6–15.4)
WBC: 6.5 x10E3/uL (ref 3.4–10.8)

## 2025-01-06 LAB — TSH: TSH: 1.2 u[IU]/mL (ref 0.450–4.500)

## 2025-01-06 LAB — HGB A1C W/O EAG: Hgb A1c MFr Bld: 6.7 % — ABNORMAL HIGH (ref 4.8–5.6)
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
## 2025-01-07 ENCOUNTER — Encounter: Payer: Self-pay | Admitting: Physician Assistant

## 2025-01-07 ENCOUNTER — Other Ambulatory Visit

## 2025-01-07 ENCOUNTER — Ambulatory Visit: Payer: Self-pay | Admitting: Physician Assistant

## 2025-01-07 ENCOUNTER — Ambulatory Visit: Admitting: Physician Assistant

## 2025-01-07 ENCOUNTER — Telehealth: Payer: Self-pay

## 2025-01-07 VITALS — BP 116/76 | HR 60 | Ht 64.0 in | Wt 170.0 lb

## 2025-01-07 DIAGNOSIS — R1013 Epigastric pain: Secondary | ICD-10-CM

## 2025-01-07 DIAGNOSIS — R066 Hiccough: Secondary | ICD-10-CM | POA: Diagnosis not present

## 2025-01-07 DIAGNOSIS — R9389 Abnormal findings on diagnostic imaging of other specified body structures: Secondary | ICD-10-CM | POA: Diagnosis not present

## 2025-01-07 DIAGNOSIS — K219 Gastro-esophageal reflux disease without esophagitis: Secondary | ICD-10-CM

## 2025-01-07 DIAGNOSIS — K862 Cyst of pancreas: Secondary | ICD-10-CM

## 2025-01-07 LAB — CBC WITH DIFFERENTIAL/PLATELET
Basophils Absolute: 0 K/uL (ref 0.0–0.1)
Basophils Relative: 0.7 % (ref 0.0–3.0)
Eosinophils Absolute: 0.2 K/uL (ref 0.0–0.7)
Eosinophils Relative: 4.1 % (ref 0.0–5.0)
HCT: 39.3 % (ref 39.0–52.0)
Hemoglobin: 13.7 g/dL (ref 13.0–17.0)
Lymphocytes Relative: 27.3 % (ref 12.0–46.0)
Lymphs Abs: 1.4 K/uL (ref 0.7–4.0)
MCHC: 34.7 g/dL (ref 30.0–36.0)
MCV: 90.7 fl (ref 78.0–100.0)
Monocytes Absolute: 0.3 K/uL (ref 0.1–1.0)
Monocytes Relative: 6.7 % (ref 3.0–12.0)
Neutro Abs: 3.2 K/uL (ref 1.4–7.7)
Neutrophils Relative %: 61.2 % (ref 43.0–77.0)
Platelets: 238 K/uL (ref 150.0–400.0)
RBC: 4.34 Mil/uL (ref 4.22–5.81)
RDW: 13.9 % (ref 11.5–15.5)
WBC: 5.2 K/uL (ref 4.0–10.5)

## 2025-01-07 LAB — COMPREHENSIVE METABOLIC PANEL WITH GFR
ALT: 10 U/L (ref 3–53)
AST: 10 U/L (ref 5–37)
Albumin: 4.2 g/dL (ref 3.5–5.2)
Alkaline Phosphatase: 58 U/L (ref 39–117)
BUN: 32 mg/dL — ABNORMAL HIGH (ref 6–23)
CO2: 25 meq/L (ref 19–32)
Calcium: 9.9 mg/dL (ref 8.4–10.5)
Chloride: 105 meq/L (ref 96–112)
Creatinine, Ser: 5.36 mg/dL (ref 0.40–1.50)
GFR: 11.14 mL/min — CL
Glucose, Bld: 91 mg/dL (ref 70–99)
Potassium: 4.2 meq/L (ref 3.5–5.1)
Sodium: 138 meq/L (ref 135–145)
Total Bilirubin: 0.7 mg/dL (ref 0.2–1.2)
Total Protein: 7.9 g/dL (ref 6.0–8.3)

## 2025-01-07 LAB — LIPASE: Lipase: 33 U/L (ref 11.0–59.0)

## 2025-01-07 MED ORDER — CHLORPROMAZINE HCL 25 MG PO TABS: 25.0000 mg | ORAL_TABLET | Freq: Three times a day (TID) | ORAL | 1 refills | Status: AC

## 2025-01-07 NOTE — Progress Notes (Signed)
 Known ESRD.  PCP is trying to get him in to start dialysis.  Otherwise labs stable.  Waiting on CA 19-9.  Walter Failing, PA-C

## 2025-01-07 NOTE — Patient Instructions (Addendum)
 Your provider has requested that you go to the basement level for lab work before leaving today. Press B on the elevator. The lab is located at the first door on the left as you exit the elevator.  We have sent the following medications to your pharmacy for you to pick up at your convenience: Chlorpromazine  25 mg twice daily.   Dr. Wilhelmenia nurse Odetta, RN will call to schedule procedure after he review your chart.   _______________________________________________________  If your blood pressure at your visit was 140/90 or greater, please contact your primary care physician to follow up on this.  _______________________________________________________  If you are age 58 or older, your body mass index should be between 23-30. Your Body mass index is 29.18 kg/m. If this is out of the aforementioned range listed, please consider follow up with your Primary Care Provider.  If you are age 33 or younger, your body mass index should be between 19-25. Your Body mass index is 29.18 kg/m. If this is out of the aformentioned range listed, please consider follow up with your Primary Care Provider.   ________________________________________________________  The [facility] providers would like to encourage you to use MYCHART to communicate with providers for non-urgent requests or questions.  Due to long hold times on the telephone, sending your provider a message by [facility] may be a faster and more efficient way to get a response.  Please allow 48 business hours for a response.  Please remember that this is for non-urgent requests.  _______________________________________________________  [facility] is using a team-based approach to care.  Your team is made up of your doctor and two to three APPS. Our APPS (Nurse Practitioners and Physician Assistants) work with your physician to ensure care continuity for you. They are fully qualified to address your health concerns and develop a  treatment plan. They communicate directly with your gastroenterologist to care for you. Seeing the Advanced Practice Practitioners on your physician's team can help you by facilitating care more promptly, often allowing for earlier appointments, access to diagnostic testing, procedures, and other specialty referrals.

## 2025-01-07 NOTE — Telephone Encounter (Signed)
 Lab called with a Critical Creatine 5.36 and  GFR 11.14

## 2025-01-10 LAB — CANCER ANTIGEN 19-9: CA 19-9: 12 U/mL

## 2025-01-12 ENCOUNTER — Telehealth: Payer: Self-pay | Admitting: Internal Medicine

## 2025-01-12 NOTE — Telephone Encounter (Signed)
 Patient called and left a voicemail on 01/07/25. Patient states this is a message for Doctor Adella, Patient states he wanted to let doctor know that he did not get the endoscopy done that day of his appointment,  Patient states the appointment was just a consultation and they draw blood.  The actual endoscopy will be at [facility] and the office where he had his appointment Friday needs to contact with the specialist who will be doing endoscopy to schedule appointment,  Patient states he will not know until days later when they will schedule him.

## 2025-01-18 ENCOUNTER — Other Ambulatory Visit: Payer: Self-pay

## 2025-01-18 ENCOUNTER — Telehealth: Payer: Self-pay

## 2025-01-18 DIAGNOSIS — K862 Cyst of pancreas: Secondary | ICD-10-CM

## 2025-01-18 NOTE — Telephone Encounter (Signed)
 EUS scheduled, pt instructed and medications reviewed.  Patient instructions mailed to home.  Patient to call with any questions or concerns.

## 2025-01-18 NOTE — Telephone Encounter (Signed)
-----   Message from Aloha Finner, MD sent at 01/13/2025  4:41 PM EST ----- Regarding: RE: EUS +FNA JLL, Thank you for forwarding this. I agree EUS makes sense. Glad CA 19-9 is normal. Lets plan for EUS as available in the next 4 to 6 weeks. Okay for trialing medication for hiccups. Thanks. GM ----- Message ----- From: Beather Delon Gibson, PA Sent: 01/07/2025  11:40 AM EST To: Odetta LITTIE Curly, RN; Aloha Finner Raddle., # Subject: EUS +FNA                                       Patient would benefit from EUS FNA.  Relieved to her timing, ordered a CA 19-9 today.  Also trialing Chlorpromazine  for hiccups, any reason why I should not?  Thanks, JL L

## 2025-01-18 NOTE — Telephone Encounter (Signed)
" °  02/21/25 EUS at [facility] with GM at 1015 am  "

## 2025-01-20 ENCOUNTER — Telehealth: Payer: Self-pay | Admitting: Internal Medicine

## 2025-01-20 ENCOUNTER — Ambulatory Visit

## 2025-01-26 ENCOUNTER — Ambulatory Visit: Admitting: Internal Medicine

## 2025-01-27 ENCOUNTER — Ambulatory Visit

## 2025-02-03 ENCOUNTER — Ambulatory Visit

## 2025-02-10 ENCOUNTER — Ambulatory Visit

## 2025-02-21 ENCOUNTER — Ambulatory Visit (HOSPITAL_COMMUNITY): Admit: 2025-02-21 | Admitting: Gastroenterology

## 2025-02-21 ENCOUNTER — Encounter (HOSPITAL_COMMUNITY): Payer: Self-pay

## 2025-02-24 ENCOUNTER — Ambulatory Visit (HOSPITAL_COMMUNITY)

## 2025-02-24 ENCOUNTER — Ambulatory Visit: Admitting: Vascular Surgery

## 2025-04-21 ENCOUNTER — Ambulatory Visit: Admitting: Internal Medicine
